# Patient Record
Sex: Female | Born: 1990 | Race: White | Hispanic: No | Marital: Single | Smoking: Never smoker
Health system: Southern US, Community
[De-identification: ages and names within clinical notes are randomized; demographics above are authoritative.]

## PROBLEM LIST (undated history)

## (undated) ENCOUNTER — Inpatient Hospital Stay (HOSPITAL_COMMUNITY): Payer: Self-pay

## (undated) DIAGNOSIS — N926 Irregular menstruation, unspecified: Secondary | ICD-10-CM

## (undated) DIAGNOSIS — B9689 Other specified bacterial agents as the cause of diseases classified elsewhere: Secondary | ICD-10-CM

## (undated) DIAGNOSIS — N898 Other specified noninflammatory disorders of vagina: Secondary | ICD-10-CM

## (undated) DIAGNOSIS — N760 Acute vaginitis: Secondary | ICD-10-CM

## (undated) DIAGNOSIS — R1032 Left lower quadrant pain: Secondary | ICD-10-CM

## (undated) DIAGNOSIS — F419 Anxiety disorder, unspecified: Secondary | ICD-10-CM

## (undated) DIAGNOSIS — R102 Pelvic and perineal pain: Secondary | ICD-10-CM

## (undated) DIAGNOSIS — E7800 Pure hypercholesterolemia, unspecified: Secondary | ICD-10-CM

## (undated) DIAGNOSIS — Z113 Encounter for screening for infections with a predominantly sexual mode of transmission: Secondary | ICD-10-CM

## (undated) DIAGNOSIS — J189 Pneumonia, unspecified organism: Secondary | ICD-10-CM

## (undated) DIAGNOSIS — Z319 Encounter for procreative management, unspecified: Secondary | ICD-10-CM

## (undated) HISTORY — PX: WISDOM TOOTH EXTRACTION: SHX21

## (undated) HISTORY — DX: Encounter for procreative management, unspecified: Z31.9

## (undated) HISTORY — DX: Pelvic and perineal pain: R10.2

## (undated) HISTORY — DX: Left lower quadrant pain: R10.32

## (undated) HISTORY — DX: Pure hypercholesterolemia, unspecified: E78.00

## (undated) HISTORY — DX: Other specified noninflammatory disorders of vagina: N89.8

## (undated) HISTORY — PX: TONSILLECTOMY: SUR1361

## (undated) HISTORY — DX: Other specified bacterial agents as the cause of diseases classified elsewhere: B96.89

## (undated) HISTORY — DX: Acute vaginitis: N76.0

## (undated) HISTORY — DX: Irregular menstruation, unspecified: N92.6

## (undated) HISTORY — DX: Encounter for screening for infections with a predominantly sexual mode of transmission: Z11.3

## (undated) HISTORY — DX: Pneumonia, unspecified organism: J18.9

---
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
## 2011-03-21 ENCOUNTER — Emergency Department (HOSPITAL_COMMUNITY)
Admission: EM | Admit: 2011-03-21 | Discharge: 2011-03-21 | Disposition: A | Discharge status: Home / Self Care | Payer: BC Managed Care – PPO | Attending: Emergency Medicine | Admitting: Emergency Medicine

## 2011-03-21 DIAGNOSIS — N390 Urinary tract infection, site not specified: Secondary | ICD-10-CM | POA: Insufficient documentation

## 2011-03-21 DIAGNOSIS — O2390 Unspecified genitourinary tract infection in pregnancy, unspecified trimester: Secondary | ICD-10-CM | POA: Insufficient documentation

## 2011-03-21 DIAGNOSIS — N898 Other specified noninflammatory disorders of vagina: Secondary | ICD-10-CM | POA: Insufficient documentation

## 2011-03-21 LAB — URINALYSIS, ROUTINE W REFLEX MICROSCOPIC
Ketones, ur: NEGATIVE mg/dL
Protein, ur: NEGATIVE mg/dL
Urobilinogen, UA: 0.2 mg/dL (ref 0.0–1.0)

## 2011-03-21 LAB — HCG, QUANTITATIVE, PREGNANCY: hCG, Beta Chain, Quant, S: 935 m[IU]/mL — ABNORMAL HIGH (ref <5)

## 2011-03-21 LAB — RPR: RPR Ser Ql: NONREACTIVE

## 2011-03-21 LAB — WET PREP, GENITAL
Trich, Wet Prep: NONE SEEN
Yeast Wet Prep HPF POC: NONE SEEN

## 2011-03-21 LAB — URINE MICROSCOPIC-ADD ON

## 2011-03-22 LAB — GC/CHLAMYDIA PROBE AMP, GENITAL: Chlamydia, DNA Probe: NEGATIVE

## 2011-03-23 LAB — HIV ANTIBODY (ROUTINE TESTING W REFLEX): HIV: NONREACTIVE

## 2011-03-23 LAB — URINE CULTURE: Colony Count: 35000

## 2011-03-23 LAB — ABO/RH: RH Type: POSITIVE

## 2011-03-23 LAB — RUBELLA ANTIBODY, IGM: Rubella: IMMUNE

## 2011-08-10 LAB — URINE MICROSCOPIC-ADD ON

## 2011-08-10 LAB — URINALYSIS, ROUTINE W REFLEX MICROSCOPIC
Glucose, UA: NEGATIVE
Ketones, ur: NEGATIVE
Leukocytes, UA: NEGATIVE
Nitrite: NEGATIVE
Protein, ur: NEGATIVE
Urobilinogen, UA: 0.2

## 2011-08-10 LAB — PREGNANCY, URINE: Preg Test, Ur: NEGATIVE

## 2011-11-04 ENCOUNTER — Encounter (HOSPITAL_COMMUNITY): Payer: Self-pay

## 2011-11-04 ENCOUNTER — Inpatient Hospital Stay (HOSPITAL_COMMUNITY)
Admission: AD | Admit: 2011-11-04 | Discharge: 2011-11-04 | Disposition: A | Discharge status: Home / Self Care | Payer: BC Managed Care – PPO | Source: Ambulatory Visit | Attending: Family Medicine | Admitting: Family Medicine

## 2011-11-04 DIAGNOSIS — O99891 Other specified diseases and conditions complicating pregnancy: Secondary | ICD-10-CM | POA: Insufficient documentation

## 2011-11-04 DIAGNOSIS — R109 Unspecified abdominal pain: Secondary | ICD-10-CM | POA: Insufficient documentation

## 2011-11-04 LAB — URINALYSIS, ROUTINE W REFLEX MICROSCOPIC
Bilirubin Urine: NEGATIVE
Leukocytes, UA: NEGATIVE
Nitrite: NEGATIVE
Specific Gravity, Urine: 1.025 (ref 1.005–1.030)
Urobilinogen, UA: 0.2 mg/dL (ref 0.0–1.0)
pH: 6.5 (ref 5.0–8.0)

## 2011-11-04 LAB — WET PREP, GENITAL: Clue Cells Wet Prep HPF POC: NONE SEEN

## 2011-11-04 NOTE — Progress Notes (Signed)
Patient presents with a lot of lower abdominal pressure, onset at work, hurts when moving around and walking.  37 weeks

## 2011-11-04 NOTE — ED Provider Notes (Signed)
History     Chief Complaint  Patient presents with  . Abdominal Pain   HPI Comments: 20yo G1 at [redacted]w[redacted]d who presents with significant pelvic pressure. Pt had an 9hr shift at [employer] today and around hour 8, she became very uncomfortable with pelvic pressure.  She says that she noticed that her legs were really swollen around that time, too.  Denies any ctx.    Denies loss of fluid, vaginal bleeding.     Notes a lot of vaginal discharge with a bad smell, no itching/burning.       Past Medical History  Diagnosis Date  . No pertinent past medical history     Past Surgical History  Procedure Date  . Tonsillectomy   . Wisdom tooth extraction     History reviewed. No pertinent family history.  History  Substance Use Topics  . Smoking status: Never Smoker   . Smokeless tobacco: Not on file  . Alcohol Use: No    Allergies: No Known Allergies  Prescriptions prior to admission  Medication Sig Dispense Refill  . acetaminophen (TYLENOL) 325 MG tablet Take 325 mg by mouth every 6 (six) hours as needed. Head aches       . famotidine (PEPCID) 20 MG tablet Take 20 mg by mouth 2 (two) times daily as needed. heartburn       . hydrOXYzine (VISTARIL) 25 MG capsule Take 25 mg by mouth 3 (three) times daily as needed. anxiety       . Multiple Vitamin (MULITIVITAMIN WITH MINERALS) TABS Take 1 tablet by mouth 2 (two) times daily.          Review of Systems  Constitutional: Negative for fever and chills.  Eyes: Negative for blurred vision.  Respiratory: Negative for cough and wheezing.   Cardiovascular: Negative for chest pain.  Gastrointestinal: Positive for heartburn. Negative for nausea, vomiting and abdominal pain.  Genitourinary: Negative for dysuria.  Skin: Negative for itching and rash.  Neurological: Positive for headaches.  Psychiatric/Behavioral: Negative for depression.   Physical Exam   Blood pressure 126/68, pulse 63, temperature 98.1 F (36.7 C), temperature source  Oral, resp. rate 18, height 5\' 7"  (1.702 m), weight 74.39 kg (164 lb).  Physical Exam  Constitutional: She is oriented to person, place, and time. She appears well-developed and well-nourished.  HENT:  Head: Normocephalic and atraumatic.  Eyes: EOM are normal.  Neck: Normal range of motion.  Cardiovascular: Normal rate and regular rhythm.   Respiratory: Effort normal and breath sounds normal.  GI:       Gravid, age appropriate for dates  Musculoskeletal: Normal range of motion.  Neurological: She is alert and oriented to person, place, and time.  Skin: Skin is warm and dry.  Psychiatric: She has a normal mood and affect.    MAU Course  Procedures  MDM -monitor on toco - wet prep -UA  Assessment and Plan  20yo G1 at [redacted]w[redacted]d who presents with pelvic pressure.  -FHT: baseline 145, accelerations present, decels absent, cat 1 -send UA, wet prep  Discussed with Caren Griffins, CNM  O'Grady, Benney [place] 11/04/2011, 6:18 PM

## 2011-11-04 NOTE — Progress Notes (Signed)
Pt presents to MAU with chief complaint of lower abdominal pressure. Pt is [redacted]w[redacted]d, G1. Works at [employer]; 9 hr days, on feet a lot. Pt has noticed increased swelling, headache; denies blurred vision. No problems with this pregnancy.

## 2011-11-04 NOTE — ED Notes (Signed)
Wet prep collected by RN- no speculum, no pelvic done. Sent to lab as ordered by Dr. Maren Reamer.

## 2011-11-20 NOTE — L&D Delivery Note (Signed)
Operative Delivery Note At 6:06 PM a viable female was delivered via Vacuum Assisted Vaginal Delivery due to fetal bradycardia.  The patient was examined and found to be Presentation: vertex; Position: Occiput,, Anterior; Station: +3.  Verbal consent: obtained from patient.  Risks and benefits discussed in detail.  Risks include, but are not limited to the risks of anesthesia, bleeding, infection, damage to maternal tissues, fetal cephalhematoma.  There is also the risk of inability to effect vaginal delivery of the head, or shoulder dystocia that cannot be resolved by established maneuvers, leading to the need for emergency cesarean section.  The Kiwi Omnicup was positioned over the sagittal suture 3 cm anterior to posterior fontanelle.  Pressure was then increased to 500 mmHg, and the patient was instructed to push.  Pulling was administered along the pelvic curve.  1 pull was administered during 1 contractions, with release of pressure between contractions.  No popoffs.  The infant was then delivered atraumatically.  APGAR: 8, 9; weight 7 lb 12.7 oz (3535 g).   Placenta status: Intact, Spontaneous.   Cord: 3 vessels with the following complications: None.    Sponge, instrument and needle counts were correct x2.  Anesthesia: Epidural  Instruments: correct x 2 Episiotomy: None Lacerations: none Est. Blood Loss (mL): 300 mL  Mom to postpartum.  Baby to nursery-stable.  STINSON, JACOB JEHIEL 09/19/2011, 10:09 PM

## 2011-11-25 ENCOUNTER — Encounter (HOSPITAL_COMMUNITY): Payer: Self-pay | Admitting: *Deleted

## 2011-11-25 ENCOUNTER — Inpatient Hospital Stay (HOSPITAL_COMMUNITY)
Admission: AD | Admit: 2011-11-25 | Discharge: 2011-11-25 | Disposition: A | Discharge status: Home / Self Care | Payer: BC Managed Care – PPO | Source: Ambulatory Visit | Attending: Obstetrics & Gynecology | Admitting: Obstetrics & Gynecology

## 2011-11-25 ENCOUNTER — Inpatient Hospital Stay (HOSPITAL_COMMUNITY)
Admission: AD | Admit: 2011-11-25 | Discharge: 2011-11-25 | Disposition: A | Discharge status: Home / Self Care | Payer: BC Managed Care – PPO | Source: Ambulatory Visit | Attending: Family Medicine | Admitting: Family Medicine

## 2011-11-25 DIAGNOSIS — O479 False labor, unspecified: Secondary | ICD-10-CM | POA: Insufficient documentation

## 2011-11-25 DIAGNOSIS — R12 Heartburn: Secondary | ICD-10-CM | POA: Insufficient documentation

## 2011-11-25 DIAGNOSIS — O4700 False labor before 37 completed weeks of gestation, unspecified trimester: Secondary | ICD-10-CM

## 2011-11-25 HISTORY — DX: Anxiety disorder, unspecified: F41.9

## 2011-11-25 LAB — COMPREHENSIVE METABOLIC PANEL
ALT: 9 U/L (ref 0–35)
AST: 15 U/L (ref 0–37)
Alkaline Phosphatase: 137 U/L — ABNORMAL HIGH (ref 39–117)
CO2: 22 mEq/L (ref 19–32)
Calcium: 8.9 mg/dL (ref 8.4–10.5)
GFR calc non Af Amer: >90 mL/min (ref >90)
Potassium: 3.8 mEq/L (ref 3.5–5.1)
Sodium: 136 mEq/L (ref 135–145)

## 2011-11-25 LAB — URINE MICROSCOPIC-ADD ON

## 2011-11-25 LAB — CBC
Hemoglobin: 12.9 g/dL (ref 12.0–15.0)
MCH: 31.9 pg (ref 26.0–34.0)
Platelets: 259 10*3/uL (ref 150–400)
RBC: 4.04 MIL/uL (ref 3.87–5.11)
WBC: 12.9 10*3/uL — ABNORMAL HIGH (ref 4.0–10.5)

## 2011-11-25 LAB — URINALYSIS, ROUTINE W REFLEX MICROSCOPIC
Bilirubin Urine: NEGATIVE
Ketones, ur: NEGATIVE mg/dL
Specific Gravity, Urine: 1.025 (ref 1.005–1.030)
Urobilinogen, UA: 0.2 mg/dL (ref 0.0–1.0)
pH: 6 (ref 5.0–8.0)

## 2011-11-25 MED ORDER — NALBUPHINE SYRINGE 5 MG/0.5 ML
10.0000 mg | INJECTION | Freq: Once | INTRAMUSCULAR | Status: AC
  Administered 2011-11-25: 10 mg via INTRAMUSCULAR
  Filled 2011-11-25: qty 1

## 2011-11-25 MED ORDER — CALCIUM CARBONATE ANTACID 500 MG PO CHEW
400.0000 mg | CHEWABLE_TABLET | Freq: Once | ORAL | Status: AC
  Administered 2011-11-25: 400 mg via ORAL
  Filled 2011-11-25: qty 2

## 2011-11-25 NOTE — Progress Notes (Signed)
Pincus Badder CNM notified of pt's admission and status. Aware of ctx pattern, sve, elevated B/Ps, c/o heartburn which has had "the entire preg".

## 2011-11-25 NOTE — Progress Notes (Signed)
Philipp Deputy CNM notified of pt's lab work, B/Ps, some variables on efm which resolved when turned to L side. EFM strip reviewed. Pt stable for d/c home.

## 2011-11-25 NOTE — ED Provider Notes (Signed)
History   Tiffany Goodwin is a 21 y.o. G1P0000 at [redacted]w[redacted]d presenting w/ reports of UCs since yesterday, w/ increased frequency and intensity since this afternoon.  Reports some bloody show.  Reports +fm.  Denies LOF.  Most of pain in lower back, but some in abdomen- associated w/ UCs.  Has tried heating pads and warm baths w/ minimal relief.  Was seen in MAU w/ the same complaints this am- labwork was done to r/o pre-eclampsia d/t mildly elevated bp's 140's/80's, and was wnl.  Pt made no cervical change and was d/c'd home.  Next appointment at [facility] on Wed 1/9.    Chief Complaint  Patient presents with   Contractions   HPI  OB History    Grav Para Term Preterm Abortions TAB SAB Ect Mult Living   1 0 0 0 0 0 0 0 0 0       Past Medical History  Diagnosis Date   Anxiety     Past Surgical History  Procedure Date   Tonsillectomy    Wisdom tooth extraction    Wisdom tooth extraction     Family History  Problem Relation Age of Onset   Anesthesia problems Neg Hx    Hypotension Neg Hx    Malignant hyperthermia Neg Hx    Pseudochol deficiency Neg Hx     History  Substance Use Topics   Smoking status: Never Smoker    Smokeless tobacco: Not on file   Alcohol Use: No    Allergies: No Known Allergies  Prescriptions prior to admission  Medication Sig Dispense Refill   acetaminophen (TYLENOL) 325 MG tablet Take 325 mg by mouth every 6 (six) hours as needed. Head aches        famotidine (PEPCID) 20 MG tablet Take 20 mg by mouth 2 (two) times daily as needed. heartburn        hydrOXYzine (VISTARIL) 25 MG capsule Take 25 mg by mouth 3 (three) times daily as needed. anxiety        Multiple Vitamin (MULITIVITAMIN WITH MINERALS) TABS Take 1 tablet by mouth 2 (two) times daily.          Review of Systems  Constitutional: Negative.   HENT: Negative.   Eyes: Negative.  Negative for blurred vision and double vision.  Respiratory: Negative.   Cardiovascular: Negative.     Gastrointestinal: Positive for abdominal pain (UC's).       Negative RUQ pain  Genitourinary: Negative.   Musculoskeletal: Positive for back pain (Low back pain associated w/ UC's).  Skin: Negative.   Neurological: Negative.  Negative for dizziness and headaches.  Endo/Heme/Allergies: Negative.   Psychiatric/Behavioral: Negative.    Physical Exam   Blood pressure 145/83, pulse 83, temperature 98.4 F (36.9 C), temperature source Oral, resp. rate 20, height 5\' 6"  (1.676 m), weight 75.297 kg (166 lb), SpO2 94.00%.  Physical Exam  Constitutional: She is oriented to person, place, and time. She appears well-developed and well-nourished.  HENT:  Head: Normocephalic.  Eyes: Pupils are equal, round, and reactive to light.  Neck: Normal range of motion.  Cardiovascular: Normal rate and regular rhythm.   Respiratory: Effort normal and breath sounds normal.  GI: Soft.       Gravid  Genitourinary: Uterus normal.  Musculoskeletal: Normal range of motion. She exhibits edema (tr/1+ bilateral lower extremity edema).  Neurological: She is alert and oriented to person, place, and time. She has normal reflexes.  Skin: Skin is warm and dry.  Psychiatric: She has a  normal mood and affect. Judgment and thought content normal.       Appears uncomfortable, teary-eyed   SVE: 1.5/100/-1 vtx w/ some bloody show by MAU RN.  Same as exam from this am's visit- except for more effaced FHR: 120 w/ mild then moderate variability, 15x15 accels, no decels= Cat I UCs: q 2-4, mild to palpation  MAU Course  Procedures  EFM SVE Nubain 10mg  IM x 1  Assessment and Plan   A: IUP @ 39.6wks     Early latent phase labor     Cat I FHR     GBS pos  P: Notified pt she could get up/walk/move around/change positions     RN repeat SVE 1 hour from 1st exam and call w/ results  Addendum: Repeat SVE by MAU RN 2/100/-1, minimal change.  UC's have become less frequent and intense.  FHR: Cat I.  POC reviewed w/  Zerita Boers, CNM.  Will administer Nubain IM and d/c home.  Pt to keep appt at [facility] on Wed as scheduled.  Reviewed reasons to return to MAU sooner: UC's increasing in frequency/intensity, SROM, VB, or decreased FM.  Pt verbalized understanding of all.  Friend to drive pt home.          Joellyn Haff, SNM 11/25/2011, 10:15 PM

## 2011-11-25 NOTE — Progress Notes (Signed)
Written and verbal d/c instructions given and understanding voiced. Discussed warm showers/baths, position changes, eating small meals and staying hydrated. Keep sch appt and call for concerns

## 2011-11-25 NOTE — Progress Notes (Signed)
Has much lower back pain with ctxs

## 2011-11-25 NOTE — ED Notes (Signed)
Heartburn is better now.

## 2011-11-26 ENCOUNTER — Encounter (HOSPITAL_COMMUNITY): Payer: Self-pay | Admitting: Anesthesiology

## 2011-11-26 ENCOUNTER — Encounter (HOSPITAL_COMMUNITY): Payer: Self-pay | Admitting: *Deleted

## 2011-11-26 ENCOUNTER — Inpatient Hospital Stay (HOSPITAL_COMMUNITY)
Admission: AD | Admit: 2011-11-26 | Discharge: 2011-11-28 | DRG: 775 | Disposition: A | Discharge status: Home / Self Care | Payer: Medicaid Other | Source: Ambulatory Visit | Attending: Obstetrics & Gynecology | Admitting: Obstetrics & Gynecology

## 2011-11-26 ENCOUNTER — Inpatient Hospital Stay (HOSPITAL_COMMUNITY): Payer: Medicaid Other | Admitting: Anesthesiology

## 2011-11-26 DIAGNOSIS — Z22330 Carrier of Group B streptococcus: Secondary | ICD-10-CM

## 2011-11-26 DIAGNOSIS — O99892 Other specified diseases and conditions complicating childbirth: Secondary | ICD-10-CM | POA: Diagnosis present

## 2011-11-26 DIAGNOSIS — O9989 Other specified diseases and conditions complicating pregnancy, childbirth and the puerperium: Secondary | ICD-10-CM

## 2011-11-26 LAB — CBC
HCT: 40.8 % (ref 36.0–46.0)
MCHC: 34.3 g/dL (ref 30.0–36.0)
Platelets: 259 10*3/uL (ref 150–400)
RDW: 13.3 % (ref 11.5–15.5)

## 2011-11-26 LAB — RPR: RPR Ser Ql: NONREACTIVE

## 2011-11-26 LAB — HEPATITIS B SURFACE ANTIGEN: Hepatitis B Surface Ag: NEGATIVE

## 2011-11-26 MED ORDER — BENZOCAINE-MENTHOL 20-0.5 % EX AERO: 1.0000 "application " | INHALATION_SPRAY | CUTANEOUS | Status: DC | PRN

## 2011-11-26 MED ORDER — ACETAMINOPHEN 325 MG PO TABS: 650.0000 mg | ORAL_TABLET | ORAL | Status: DC | PRN

## 2011-11-26 MED ORDER — FLEET ENEMA 7-19 GM/118ML RE ENEM: 1.0000 | ENEMA | RECTAL | Status: DC | PRN

## 2011-11-26 MED ORDER — DIPHENHYDRAMINE HCL 50 MG/ML IJ SOLN: 12.5000 mg | INTRAMUSCULAR | Status: DC | PRN

## 2011-11-26 MED ORDER — PHENYLEPHRINE 40 MCG/ML (10ML) SYRINGE FOR IV PUSH (FOR BLOOD PRESSURE SUPPORT)
80.0000 ug | PREFILLED_SYRINGE | INTRAVENOUS | Status: DC | PRN
  Filled 2011-11-26: qty 5

## 2011-11-26 MED ORDER — WITCH HAZEL-GLYCERIN EX PADS
1.0000 | MEDICATED_PAD | CUTANEOUS | Status: DC | PRN
Start: 2011-11-26 — End: 2011-11-28

## 2011-11-26 MED ORDER — TETANUS-DIPHTH-ACELL PERTUSSIS 5-2.5-18.5 LF-MCG/0.5 IM SUSP
0.5000 mL | Freq: Once | INTRAMUSCULAR | Status: AC
  Administered 2011-11-27: 0.5 mL via INTRAMUSCULAR
  Filled 2011-11-26: qty 0.5

## 2011-11-26 MED ORDER — OXYTOCIN 20 UNITS IN LACTATED RINGERS INFUSION - SIMPLE: 125.0000 mL/h | Freq: Once | INTRAVENOUS | Status: DC

## 2011-11-26 MED ORDER — DEXTROSE 5 % IV SOLN
5.0000 10*6.[IU] | Freq: Once | INTRAVENOUS | Status: DC
  Administered 2011-11-26: 5 10*6.[IU] via INTRAVENOUS
  Filled 2011-11-26: qty 5

## 2011-11-26 MED ORDER — OXYTOCIN 20 UNITS IN LACTATED RINGERS INFUSION - SIMPLE
1.0000 m[IU]/min | INTRAVENOUS | Status: DC
  Administered 2011-11-26: 2 m[IU]/min via INTRAVENOUS
  Administered 2011-11-26: 333 m[IU]/min via INTRAVENOUS
  Filled 2011-11-26: qty 1000

## 2011-11-26 MED ORDER — ZOLPIDEM TARTRATE 5 MG PO TABS: 5.0000 mg | ORAL_TABLET | Freq: Every evening | ORAL | Status: DC | PRN

## 2011-11-26 MED ORDER — ONDANSETRON HCL 4 MG PO TABS: 4.0000 mg | ORAL_TABLET | ORAL | Status: DC | PRN

## 2011-11-26 MED ORDER — DIPHENHYDRAMINE HCL 25 MG PO CAPS: 25.0000 mg | ORAL_CAPSULE | Freq: Four times a day (QID) | ORAL | Status: DC | PRN

## 2011-11-26 MED ORDER — LIDOCAINE HCL 1.5 % IJ SOLN
INTRAMUSCULAR | Status: DC | PRN
  Administered 2011-11-26 (×2): 5 mL via EPIDURAL

## 2011-11-26 MED ORDER — PENICILLIN G POTASSIUM 5000000 UNITS IJ SOLR
2.5000 10*6.[IU] | INTRAVENOUS | Status: DC
  Administered 2011-11-26: 2.5 10*6.[IU] via INTRAVENOUS
  Filled 2011-11-26 (×4): qty 2.5

## 2011-11-26 MED ORDER — LANOLIN HYDROUS EX OINT: TOPICAL_OINTMENT | CUTANEOUS | Status: DC | PRN

## 2011-11-26 MED ORDER — LACTATED RINGERS IV SOLN: 500.0000 mL | Freq: Once | INTRAVENOUS | Status: DC

## 2011-11-26 MED ORDER — PRENATAL MULTIVITAMIN CH
1.0000 | ORAL_TABLET | Freq: Every day | ORAL | Status: DC
  Administered 2011-11-27 – 2011-11-28 (×2): 1 via ORAL
  Filled 2011-11-26 (×2): qty 1

## 2011-11-26 MED ORDER — BENZOCAINE-MENTHOL 20-0.5 % EX AERO
INHALATION_SPRAY | CUTANEOUS | Status: AC
  Administered 2011-11-26: 22:00:00
  Filled 2011-11-26: qty 56

## 2011-11-26 MED ORDER — IBUPROFEN 600 MG PO TABS
600.0000 mg | ORAL_TABLET | Freq: Four times a day (QID) | ORAL | Status: DC
  Administered 2011-11-26 – 2011-11-28 (×6): 600 mg via ORAL
  Filled 2011-11-26 (×6): qty 1

## 2011-11-26 MED ORDER — EPHEDRINE 5 MG/ML INJ
10.0000 mg | INTRAVENOUS | Status: DC | PRN
  Administered 2011-11-26: 15 mg via INTRAVENOUS
  Filled 2011-11-26: qty 4

## 2011-11-26 MED ORDER — PHENYLEPHRINE 40 MCG/ML (10ML) SYRINGE FOR IV PUSH (FOR BLOOD PRESSURE SUPPORT): 80.0000 ug | PREFILLED_SYRINGE | INTRAVENOUS | Status: DC | PRN

## 2011-11-26 MED ORDER — TERBUTALINE SULFATE 1 MG/ML IJ SOLN: 0.2500 mg | Freq: Once | INTRAMUSCULAR | Status: DC | PRN

## 2011-11-26 MED ORDER — FENTANYL 2.5 MCG/ML BUPIVACAINE 1/10 % EPIDURAL INFUSION (WH - ANES)
14.0000 mL/h | INTRAMUSCULAR | Status: DC
  Administered 2011-11-26: 14 mL/h via EPIDURAL
  Filled 2011-11-26 (×2): qty 60

## 2011-11-26 MED ORDER — LIDOCAINE HCL (PF) 1 % IJ SOLN
30.0000 mL | INTRAMUSCULAR | Status: DC | PRN
  Filled 2011-11-26: qty 30

## 2011-11-26 MED ORDER — SENNOSIDES-DOCUSATE SODIUM 8.6-50 MG PO TABS
2.0000 | ORAL_TABLET | Freq: Every day | ORAL | Status: DC
  Administered 2011-11-26 – 2011-11-27 (×2): 2 via ORAL

## 2011-11-26 MED ORDER — SIMETHICONE 80 MG PO CHEW: 80.0000 mg | CHEWABLE_TABLET | ORAL | Status: DC | PRN

## 2011-11-26 MED ORDER — ONDANSETRON HCL 4 MG/2ML IJ SOLN: 4.0000 mg | Freq: Four times a day (QID) | INTRAMUSCULAR | Status: DC | PRN

## 2011-11-26 MED ORDER — LACTATED RINGERS IV SOLN: 500.0000 mL | INTRAVENOUS | Status: DC | PRN

## 2011-11-26 MED ORDER — CITRIC ACID-SODIUM CITRATE 334-500 MG/5ML PO SOLN: 30.0000 mL | ORAL | Status: DC | PRN

## 2011-11-26 MED ORDER — LACTATED RINGERS IV SOLN
INTRAVENOUS | Status: DC
  Administered 2011-11-26 (×2): via INTRAVENOUS

## 2011-11-26 MED ORDER — OXYCODONE-ACETAMINOPHEN 5-325 MG PO TABS: 2.0000 | ORAL_TABLET | ORAL | Status: DC | PRN

## 2011-11-26 MED ORDER — OXYCODONE-ACETAMINOPHEN 5-325 MG PO TABS
1.0000 | ORAL_TABLET | ORAL | Status: DC | PRN
  Administered 2011-11-27: 1 via ORAL
  Filled 2011-11-26: qty 1

## 2011-11-26 MED ORDER — NALBUPHINE SYRINGE 5 MG/0.5 ML
10.0000 mg | INJECTION | INTRAMUSCULAR | Status: DC | PRN
  Administered 2011-11-26: 10 mg via INTRAVENOUS

## 2011-11-26 MED ORDER — DIBUCAINE 1 % RE OINT: 1.0000 "application " | TOPICAL_OINTMENT | RECTAL | Status: DC | PRN

## 2011-11-26 MED ORDER — EPHEDRINE 5 MG/ML INJ: 10.0000 mg | INTRAVENOUS | Status: DC | PRN

## 2011-11-26 MED ORDER — ONDANSETRON HCL 4 MG/2ML IJ SOLN: 4.0000 mg | INTRAMUSCULAR | Status: DC | PRN

## 2011-11-26 MED ORDER — FENTANYL 2.5 MCG/ML BUPIVACAINE 1/10 % EPIDURAL INFUSION (WH - ANES)
INTRAMUSCULAR | Status: DC | PRN
  Administered 2011-11-26: 12 mL/h via EPIDURAL

## 2011-11-26 MED ORDER — NALBUPHINE SYRINGE 5 MG/0.5 ML
INJECTION | INTRAMUSCULAR | Status: AC
  Filled 2011-11-26: qty 1

## 2011-11-26 MED ORDER — OXYTOCIN BOLUS FROM INFUSION
500.0000 mL | Freq: Once | INTRAVENOUS | Status: DC
  Filled 2011-11-26: qty 500

## 2011-11-26 MED ORDER — IBUPROFEN 600 MG PO TABS: 600.0000 mg | ORAL_TABLET | Freq: Four times a day (QID) | ORAL | Status: DC | PRN

## 2011-11-26 NOTE — Progress Notes (Signed)
Subjective: Patient fairly comfortable - feels some pressure in bottom.  Objective: BP 137/82  Pulse 102  Temp(Src) 97.7 F (36.5 C) (Oral)  Resp 18  Ht 5\' 6"  (1.676 m)  Wt 75.297 kg (166 lb)  BMI 26.79 kg/m2      FHT:  FHR: 130 bpm, variability: moderate,  accelerations:  Present,  decelerations:  Absent UC:   regular, every 2-3 minutes SVE:   Dilation: 8 Effacement (%): 100 Station: +1 Exam by:: Currie Paris, RN  Labs: Lab Results  Component Value Date   WBC 17.6* 11/26/2011   HGB 14.0 11/26/2011   HCT 40.8 11/26/2011   MCV 92.7 11/26/2011   PLT 259 11/26/2011    Assessment / Plan: Augmentation of labor, progressing well  Labor: Progressing normally Fetal Wellbeing:  Category I Pain Control:  Epidural I/D:  penicillin Anticipated MOD:  NSVD  STINSON, JACOB JEHIEL 11/26/2011, 4:09 PM

## 2011-11-26 NOTE — Progress Notes (Signed)
Pt in for labor eval via ems, reports ucs since Saturday, worse in back today.  Reports blood-tinged mucus discharge.  + FM.

## 2011-11-26 NOTE — ED Notes (Signed)
Cool cloth to neck, rm temp turned down.  Mom instructed on counter pressure to low back

## 2011-11-26 NOTE — Progress Notes (Signed)
Subjective: Patient more comfortable with contractions.  Objective: BP 106/61  Pulse 96  Temp(Src) 97.9 F (36.6 C) (Oral)  Resp 18  Ht 5\' 6"  (1.676 m)  Wt 75.297 kg (166 lb)  BMI 26.79 kg/m2     FHT:  FHR: 130s bpm, variability: moderate,  accelerations:  Present,  decelerations:  Absent UC:   regular, every 5-8 minutes SVE:   Dilation: 5.5 Effacement (%): 90 Station: -1 Exam by:: [name]: Lab Results  Component Value Date   WBC 17.6* 11/26/2011   HGB 14.0 11/26/2011   HCT 40.8 11/26/2011   MCV 92.7 11/26/2011   PLT 259 11/26/2011    Assessment / Plan: AROM with clear fluid.  Will start pitocin.  expect NSVD.  Category 1 tracing.  Penicillin for GBS.   Tiffany Goodwin, Tiffany Goodwin 11/26/2011, 2:11 PM

## 2011-11-26 NOTE — H&P (Signed)
Tiffany Goodwin is a 21 y.o. female presenting for abdominal and back pain since Saturday. She does not report having vaginal discharge or bleeding. No nausea or vomiting, afebrile. Came yesterday to MAU and had cervix 2 cm. Comes today and it is decided to admit her after having cervical changes. History OB History    Grav Para Term Preterm Abortions TAB SAB Ect Mult Living   1 0 0 0 0 0 0 0 0 0      Past Medical History  Diagnosis Date  . Anxiety    Past Surgical History  Procedure Date  . Tonsillectomy   . Wisdom tooth extraction   . Wisdom tooth extraction    Family History: family history is negative for Anesthesia problems, and Hypotension, and Malignant hyperthermia, and Pseudochol deficiency, . Social History:  reports that she has never smoked. She does not have any smokeless tobacco history on file. She reports that she does not drink alcohol or use illicit drugs.  Review of Systems  Constitutional: Negative for fever and chills.  Eyes: Negative for blurred vision and double vision.  Respiratory: Negative.   Cardiovascular: Negative for chest pain.  Gastrointestinal: Positive for abdominal pain.  Genitourinary: Negative.   Musculoskeletal: Positive for back pain.  Neurological: Negative.  Negative for headaches.    Dilation: 5 Effacement (%): 90 Station: -1 Exam by:: Dr. Adrian Blackwater Blood pressure 138/92 and after pain medication pt is 109/79 and  pulse from 107 to 79 , temperature 97.7 F (36.5 C), temperature source Oral, resp. rate 20. Maternal Exam:  Introitus: Vagina is positive for vaginal discharge.    Physical Exam  Constitutional: She is oriented to person, place, and time. She appears well-developed. She appears distressed.       Due to pain.  HENT:  Mouth/Throat: No oropharyngeal exudate.  Eyes: Conjunctivae are normal.  Neck: Neck supple.  Cardiovascular: Normal rate, regular rhythm and normal heart sounds.   No murmur heard. Respiratory: Effort normal  and breath sounds normal. No respiratory distress. She has no wheezes.  GI: Bowel sounds are normal. There is no rebound and no guarding.       Normal abdominal exam of a 40 w pregnant pt.  Genitourinary: Vaginal discharge found.       Normal vagina. Cervix 5-6 cm. 90 % effaced. Station -1 Mucus plug expulsed. Intact membranes.  Musculoskeletal: She exhibits no edema.  Neurological: She is alert and oriented to person, place, and time. She has normal reflexes.  Skin: No rash noted.     Prenatal labs: ABO, Rh: A/Positive/-- (05/04 0000) Antibody: Negative (05/04 0000) Rubella: Immune (05/04 0000) RPR: NON REACTIVE (05/02 1047)  HBsAg:   not found HIV: Non-reactive (05/04 0000)  GBS: Positive (12/20 0000)   Assessment/Plan: Assessment: 1. Labor: active phase at 40 weeks 2. Fetal Wellbeing: Category 1  3. Pain Control: may have epidural 4. GBS: Positive 5. 40 week IUP  Plan:  1. Admit to BS. 2. Routine L&D orders 3. Penicillin per GBS protocol. 3. Analgesia/anesthesia PRN   D. Piloto [employer]. MD PGY-1   11/26/2011, 11:42 AM

## 2011-11-26 NOTE — Anesthesia Procedure Notes (Signed)
Epidural Patient location during procedure: OB Start time: 11/26/2011 1:34 PM End time: 11/26/2011 1:41 PM  Staffing Anesthesiologist: Sandrea Hughs Performed by: anesthesiologist   Preanesthetic Checklist Completed: patient identified, site marked, surgical consent, pre-op evaluation, timeout performed, IV checked, risks and benefits discussed and monitors and equipment checked  Epidural Patient position: sitting Prep: site prepped and draped and DuraPrep Patient monitoring: continuous pulse ox and blood pressure Approach: midline Injection technique: LOR air  Needle:  Needle type: Tuohy  Needle gauge: 17 G Needle length: 9 cm Needle insertion depth: 7 cm Catheter type: closed end flexible Catheter size: 19 Gauge Catheter at skin depth: 12 cm Test dose: negative and 1.5% lidocaine  Assessment Sensory level: T8 Events: blood not aspirated, injection not painful, no injection resistance, negative IV test and no paresthesia

## 2011-11-26 NOTE — Progress Notes (Signed)
Came in by EMS, ongoing labor was here Sat and Sun

## 2011-11-26 NOTE — H&P (Signed)
I have seen and examined this patient in conjunction with Dr Aviva Signs de Criselda Peaches, PGY1.  I have taken this history and performed the exam.  I agree with the note as written above and have made corrections as needed.   Tiffany Goodwin 11/26/2011 2:53 PM

## 2011-11-26 NOTE — Anesthesia Preprocedure Evaluation (Signed)
Anesthesia Evaluation  Patient identified by MRN, date of birth, ID band Patient awake    Reviewed: Allergy & Precautions, H&P , NPO status , Patient's Chart, lab work & pertinent test results  Airway Mallampati: I TM Distance: >3 FB Neck ROM: full    Dental No notable dental hx.    Pulmonary neg pulmonary ROS,    Pulmonary exam normal       Cardiovascular neg cardio ROS     Neuro/Psych PSYCHIATRIC DISORDERS Anxiety Negative Neurological ROS     GI/Hepatic negative GI ROS, Neg liver ROS,   Endo/Other  Negative Endocrine ROS  Renal/GU negative Renal ROS  Genitourinary negative   Musculoskeletal negative musculoskeletal ROS (+)   Abdominal Normal abdominal exam  (+)   Peds negative pediatric ROS (+)  Hematology negative hematology ROS (+)   Anesthesia Other Findings   Reproductive/Obstetrics (+) Pregnancy                           Anesthesia Physical Anesthesia Plan  ASA: II  Anesthesia Plan: Epidural   Post-op Pain Management:    Induction:   Airway Management Planned:   Additional Equipment:   Intra-op Plan:   Post-operative Plan:   Informed Consent: I have reviewed the patients History and Physical, chart, labs and discussed the procedure including the risks, benefits and alternatives for the proposed anesthesia with the patient or authorized representative who has indicated his/her understanding and acceptance.     Plan Discussed with:   Anesthesia Plan Comments:         Anesthesia Quick Evaluation

## 2011-11-27 ENCOUNTER — Encounter (HOSPITAL_COMMUNITY): Payer: Self-pay | Admitting: *Deleted

## 2011-11-27 NOTE — Progress Notes (Signed)
Post Partum Day 1 Subjective: voiding, tolerating PO, + flatus and back pain  Objective: Blood pressure 96/59, pulse 70, temperature 97.6 F (36.4 C), temperature source Oral, resp. rate 18, height 5\' 6"  (1.676 m), weight 75.297 kg (166 lb).  Physical Exam:  General: alert, cooperative, appears stated age and no distress Lochia: appropriate Uterine Fundus: firm Pelvis: no signs of active bleeding, normal lochia  DVT Evaluation: No evidence of DVT seen on physical exam. Negative Homan's sign. No cords or calf tenderness. No significant calf/ankle edema.   Basename 11/26/11 1145 11/25/11 0420  HGB 14.0 12.9  HCT 40.8 37.2    Assessment/Plan: Plan for discharge tomorrow GBS positive, patient treated with ABx intrapartum (PCN) Bottle feeding Cont routine care    LOS: 1 day   Tiffany Goodwin 11/27/2011, 7:57 AM

## 2011-11-27 NOTE — Addendum Note (Signed)
Addendum  created 11/27/11 1037 by Madison Hickman   Modules edited:Charges VN, Notes Section

## 2011-11-27 NOTE — Anesthesia Postprocedure Evaluation (Signed)
Anesthesia Post Note  Patient: Tiffany Goodwin  Procedure(s) Performed: * No procedures listed *  Anesthesia type: Epidural  Patient location: Mother/Baby  Post pain: Pain level controlled  Post assessment: Post-op Vital signs reviewed  Last Vitals:  Filed Vitals:   11/27/11 0608  BP: 96/59  Pulse: 70  Temp: 36.4 C  Resp: 18    Post vital signs: Reviewed  Level of consciousness: awake  Complications: No apparent anesthesia complications

## 2011-11-27 NOTE — Anesthesia Postprocedure Evaluation (Signed)
  Anesthesia Post-op Note  Patient: Financial planner  Procedure(s) Performed: * No procedures listed *  Patient Location: Mother/Baby  Anesthesia Type: Epidural  Level of Consciousness: awake, alert  and oriented  Airway and Oxygen Therapy: Patient Spontanous Breathing  Post-op Pain: none  Post-op Assessment: Post-op Vital signs reviewed, Patient's Cardiovascular Status Stable, No headache, No backache, No residual numbness and No residual motor weakness  Post-op Vital Signs: Reviewed and stable  Complications: No apparent anesthesia complications

## 2011-11-28 MED ORDER — OXYCODONE-ACETAMINOPHEN 5-325 MG PO TABS: 1.0000 | ORAL_TABLET | ORAL | Status: AC | PRN

## 2011-11-28 NOTE — Progress Notes (Signed)
Post Partum Day #2  Subjective: no complaints, up ad lib, voiding, + flatus and denies any pain or abnormal bleeding  Objective: Blood pressure 121/79, pulse 72, temperature 97.9 F (36.6 C), temperature source Oral, resp. rate 18, height 5\' 6"  (1.676 m), weight 75.297 kg (166 lb), SpO2 98.00%, unknown if currently breastfeeding.  Physical Exam:  General: alert, cooperative, appears stated age and no distress Lochia: appropriate Uterine Fundus: firm DVT Evaluation: No evidence of DVT seen on physical exam. Negative Homan's sign. No cords or calf tenderness. No significant calf/ankle edema. Lungs CTA bilaterally, normal resp efffort    Basename 11/26/11 1145  HGB 14.0  HCT 40.8    Assessment/Plan: Discharge home   LOS: 2 days   Cameron Proud 11/28/2011, 11:07 AM

## 2011-11-28 NOTE — Discharge Summary (Signed)
Obstetric Discharge Summary Reason for Admission: onset of labor Prenatal Procedures: none Intrapartum Procedures: spontaneous vaginal delivery, vacuum and GBS prophylaxis Postpartum Procedures: antibiotics Complications-Operative and Postpartum: none Hemoglobin  Date Value Range Status  11/26/2011 14.0  12.0-15.0 (g/dL) Final     HCT  Date Value Range Status  11/26/2011 40.8  36.0-46.0 (%) Final    Discharge Diagnoses: Term Pregnancy-delivered  Discharge Information: Date: 11/28/2011 Activity: unrestricted and pelvic rest Diet: routine Medications: Percocet Condition: stable Instructions: refer to practice specific booklet Discharge to: home Follow-up Information    Follow up with [facility] in 4 weeks. (Keep your scheduled appointment)    Contact information:   [address] [place] [address]          Newborn Data: Live born female  Birth Weight: 6 lb 13.8 oz (3113 g) APGAR: 8, 9  Home with mother.  Tiffany Goodwin 11/28/2011, 11:17 AM

## 2012-01-11 ENCOUNTER — Other Ambulatory Visit: Payer: Self-pay

## 2012-01-11 ENCOUNTER — Encounter (HOSPITAL_COMMUNITY): Payer: Self-pay | Admitting: *Deleted

## 2012-01-11 ENCOUNTER — Emergency Department (HOSPITAL_COMMUNITY): Payer: Medicaid Other

## 2012-01-11 ENCOUNTER — Emergency Department (HOSPITAL_COMMUNITY)
Admission: EM | Admit: 2012-01-11 | Discharge: 2012-01-11 | Disposition: A | Discharge status: Home / Self Care | Payer: Medicaid Other | Attending: Emergency Medicine | Admitting: Emergency Medicine

## 2012-01-11 DIAGNOSIS — R110 Nausea: Secondary | ICD-10-CM | POA: Insufficient documentation

## 2012-01-11 DIAGNOSIS — R079 Chest pain, unspecified: Secondary | ICD-10-CM | POA: Insufficient documentation

## 2012-01-11 DIAGNOSIS — D72829 Elevated white blood cell count, unspecified: Secondary | ICD-10-CM | POA: Insufficient documentation

## 2012-01-11 DIAGNOSIS — IMO0001 Reserved for inherently not codable concepts without codable children: Secondary | ICD-10-CM | POA: Insufficient documentation

## 2012-01-11 DIAGNOSIS — B9789 Other viral agents as the cause of diseases classified elsewhere: Secondary | ICD-10-CM | POA: Insufficient documentation

## 2012-01-11 DIAGNOSIS — B349 Viral infection, unspecified: Secondary | ICD-10-CM

## 2012-01-11 DIAGNOSIS — I498 Other specified cardiac arrhythmias: Secondary | ICD-10-CM | POA: Insufficient documentation

## 2012-01-11 DIAGNOSIS — R51 Headache: Secondary | ICD-10-CM | POA: Insufficient documentation

## 2012-01-11 DIAGNOSIS — R6883 Chills (without fever): Secondary | ICD-10-CM | POA: Insufficient documentation

## 2012-01-11 LAB — BASIC METABOLIC PANEL
Calcium: 9.7 mg/dL (ref 8.4–10.5)
GFR calc Af Amer: >90 mL/min (ref >90)
GFR calc non Af Amer: >90 mL/min (ref >90)
Potassium: 3.6 mEq/L (ref 3.5–5.1)
Sodium: 138 mEq/L (ref 135–145)

## 2012-01-11 LAB — CBC
MCH: 30.6 pg (ref 26.0–34.0)
MCHC: 34.3 g/dL (ref 30.0–36.0)
Platelets: 292 10*3/uL (ref 150–400)
RBC: 4.41 MIL/uL (ref 3.87–5.11)
RDW: 12.2 % (ref 11.5–15.5)

## 2012-01-11 LAB — DIFFERENTIAL
Basophils Absolute: 0 10*3/uL (ref 0.0–0.1)
Basophils Relative: 0 % (ref 0–1)
Eosinophils Absolute: 0 10*3/uL (ref 0.0–0.7)
Lymphs Abs: 1 10*3/uL (ref 0.7–4.0)
Neutrophils Relative %: 89 % — ABNORMAL HIGH (ref 43–77)

## 2012-01-11 MED ORDER — SODIUM CHLORIDE 0.9 % IV BOLUS (SEPSIS)
1000.0000 mL | Freq: Once | INTRAVENOUS | Status: AC
  Administered 2012-01-11: 1000 mL via INTRAVENOUS

## 2012-01-11 MED ORDER — KETOROLAC TROMETHAMINE 30 MG/ML IJ SOLN
30.0000 mg | Freq: Once | INTRAMUSCULAR | Status: AC
  Administered 2012-01-11: 30 mg via INTRAVENOUS
  Filled 2012-01-11: qty 1

## 2012-01-11 NOTE — ED Provider Notes (Signed)
This chart was scribed for Gerhard Munch, MD by Wallis Mart. The patient was seen in room APA18/APA18 and the patient's care was started at 8:23 PM.   CSN: [id number]  Arrival date & time 01/11/12  2000   First MD Initiated Contact with Patient 01/11/12 2014      Chief Complaint  Patient presents with  . Chest Pain     HPI  Tiffany Goodwin is a 21 y.o. female who presents to the Emergency Department complaining of sub-acute onset, persistence of constant, gradually worsening diffuse body aches with non-radiating cp onset approximately 5 hours ago.  Pt had a similar episode a few weeks ago, but states that the current episode is worse.  Pt describs the pain as pressure like.  Pt c/o associated nausea, chills, headaches.  Pt took advil w/ mild relief of sx.  Nothing worsens the pain. No dyspnea, no syncope, near syncope, no vomiting.  She notes multiple sick contacts. There are no other associated symptoms and no other alleviating or aggravating factors.  Past Medical History  Diagnosis Date  . Anxiety     Past Surgical History  Procedure Date  . Tonsillectomy   . Wisdom tooth extraction   . Wisdom tooth extraction     Family History  Problem Relation Age of Onset  . Anesthesia problems Neg Hx   . Hypotension Neg Hx   . Malignant hyperthermia Neg Hx   . Pseudochol deficiency Neg Hx     History  Substance Use Topics  . Smoking status: Never Smoker   . Smokeless tobacco: Not on file  . Alcohol Use: No    OB History    Grav Para Term Preterm Abortions TAB SAB Ect Mult Living   1 1 1  0 0 0 0 0 0 1      Review of Systems  Constitutional: Positive for chills. Negative for fever.  HENT: Negative for rhinorrhea and neck pain.   Eyes: Negative for pain and discharge.  Respiratory: Negative for cough and shortness of breath.   Cardiovascular: Positive for chest pain. Negative for palpitations.  Gastrointestinal: Positive for nausea. Negative for vomiting, abdominal  pain and diarrhea.  Genitourinary: Negative for dysuria.  Musculoskeletal: Positive for myalgias. Negative for back pain.  Skin: Negative for rash.  Neurological: Positive for headaches. Negative for weakness.    Allergies  Review of patient's allergies indicates no known allergies.  Home Medications   Current Outpatient Rx  Name Route Sig Dispense Refill  . FAMOTIDINE 20 MG PO TABS Oral Take 20 mg by mouth 2 (two) times daily as needed. heartburn     . HYDROXYZINE PAMOATE 25 MG PO CAPS Oral Take 25 mg by mouth 3 (three) times daily as needed. anxiety    . ADULT MULTIVITAMIN W/MINERALS CH Oral Take 1 tablet by mouth 2 (two) times daily.        BP 121/77  Pulse 135  Temp(Src) 99.1 F (37.3 C) (Oral)  Resp 18  Ht 5\' 7"  (1.702 m)  Wt 140 lb (63.504 kg)  BMI 21.93 kg/m2  SpO2 99%  Breastfeeding? Unknown  Physical Exam  Nursing note and vitals reviewed. Constitutional: She is oriented to person, place, and time. She appears well-developed and well-nourished. No distress.  HENT:  Head: Normocephalic and atraumatic.  Eyes: EOM are normal. Pupils are equal, round, and reactive to light.  Neck: Normal range of motion. Neck supple. No tracheal deviation present.  Cardiovascular: Normal rate and regular rhythm.   Pulmonary/Chest:  Effort normal and breath sounds normal. No respiratory distress.  Abdominal: Soft. She exhibits no distension.  Musculoskeletal: Normal range of motion. She exhibits no edema.  Neurological: She is alert and oriented to person, place, and time. No sensory deficit.  Skin: Skin is warm and dry.  Psychiatric: She has a normal mood and affect. Her behavior is normal.    ED Course  Procedures (including critical care time) DIAGNOSTIC STUDIES: Oxygen Saturation is 99% on room air, normal by my interpretation.   Cardiac monitor 90-SR  CXR reviewed by me   Date: 01/11/2012  Rate: 109  Rhythm: sinus tachycardia  QRS Axis: normal  Intervals: normal   ST/T Wave abnormalities: nonspecific T wave changes  Conduction Disutrbances:none  Narrative Interpretation:   Old EKG Reviewed: none available     COORDINATION OF CARE:  8:30 PM: pt evaluated, physical exam complete  Labs Reviewed - No data to display No results found.   No diagnosis found.    MDM  This generally well-appearing young female presents with several hours of generalized discomfort, nausea, focal chest discomfort diffusely in the anterior chest.  On exam she is in no distress, with no hypoxia, tachypnea, no lower extremity edema.  The patient's vital signs are notable for tachycardia, which decreases significantly when not being observed.  The patient has a notable family history of early cardiac disease, her father has had stents and pacemaker before the age of 12.  Although the patient is tachycardic, her vital signs are otherwise unremarkable her physical presentation is unremarkable.  The patient has no physical stigmata suggestive of PE, though her tachycardia is noted.  The absence of hypoxia, dyspnea, tachypnea his reassuring for the very low probability of this cause.  The patient's labs are notable for mild leukocytosis which is lower than her typical reading.     Gerhard Munch, MD 01/11/12 2137

## 2012-01-11 NOTE — Discharge Instructions (Signed)
A specific cause of your symptoms was not demonstrated to connect emergency department evaluation.  Her symptoms are most consistent with a viral illness.  However, it is very important that you continue to have your condition evaluated and monitored by your primary care physician.  Please make sure to call in the morning to arrange appropriate followup care.  If you develop any new, or concerning changes in your condition, please return to the emergency department immediately.

## 2012-01-11 NOTE — ED Notes (Signed)
Chest pain with body aches , nausea no vomiting

## 2012-11-13 ENCOUNTER — Emergency Department (HOSPITAL_COMMUNITY): Admission: EM | Admit: 2012-11-13 | Discharge: 2012-11-13 | Discharge status: Against Medical Advice | Payer: Self-pay

## 2012-11-20 ENCOUNTER — Encounter (HOSPITAL_COMMUNITY): Payer: Self-pay | Admitting: *Deleted

## 2012-11-20 ENCOUNTER — Emergency Department (HOSPITAL_COMMUNITY)
Admission: EM | Admit: 2012-11-20 | Discharge: 2012-11-20 | Disposition: A | Discharge status: Home / Self Care | Payer: Medicaid Other | Attending: Emergency Medicine | Admitting: Emergency Medicine

## 2012-11-20 DIAGNOSIS — R42 Dizziness and giddiness: Secondary | ICD-10-CM | POA: Insufficient documentation

## 2012-11-20 DIAGNOSIS — Z3202 Encounter for pregnancy test, result negative: Secondary | ICD-10-CM | POA: Insufficient documentation

## 2012-11-20 DIAGNOSIS — R112 Nausea with vomiting, unspecified: Secondary | ICD-10-CM | POA: Insufficient documentation

## 2012-11-20 DIAGNOSIS — R6889 Other general symptoms and signs: Secondary | ICD-10-CM

## 2012-11-20 DIAGNOSIS — Z8659 Personal history of other mental and behavioral disorders: Secondary | ICD-10-CM | POA: Insufficient documentation

## 2012-11-20 DIAGNOSIS — R197 Diarrhea, unspecified: Secondary | ICD-10-CM | POA: Insufficient documentation

## 2012-11-20 LAB — CBC WITH DIFFERENTIAL/PLATELET
Basophils Absolute: 0 10*3/uL (ref 0.0–0.1)
Basophils Relative: 0 % (ref 0–1)
Eosinophils Absolute: 0 10*3/uL (ref 0.0–0.7)
Eosinophils Relative: 0 % (ref 0–5)
HCT: 43.2 % (ref 36.0–46.0)
Hemoglobin: 14.7 g/dL (ref 12.0–15.0)
Lymphocytes Relative: 4 % — ABNORMAL LOW (ref 12–46)
Lymphs Abs: 0.5 10*3/uL — ABNORMAL LOW (ref 0.7–4.0)
MCH: 31 pg (ref 26.0–34.0)
MCHC: 34 g/dL (ref 30.0–36.0)
MCV: 91.1 fL (ref 78.0–100.0)
Monocytes Absolute: 0.5 10*3/uL (ref 0.1–1.0)
Monocytes Relative: 4 % (ref 3–12)
Neutro Abs: 12.2 10*3/uL — ABNORMAL HIGH (ref 1.7–7.7)
Neutrophils Relative %: 92 % — ABNORMAL HIGH (ref 43–77)
Platelets: 293 10*3/uL (ref 150–400)
RBC: 4.74 MIL/uL (ref 3.87–5.11)
RDW: 12.6 % (ref 11.5–15.5)
WBC: 13.2 10*3/uL — ABNORMAL HIGH (ref 4.0–10.5)

## 2012-11-20 LAB — URINALYSIS, ROUTINE W REFLEX MICROSCOPIC
Bilirubin Urine: NEGATIVE
Glucose, UA: NEGATIVE mg/dL
Hgb urine dipstick: NEGATIVE
Ketones, ur: NEGATIVE mg/dL
Nitrite: NEGATIVE
Protein, ur: NEGATIVE mg/dL
Specific Gravity, Urine: 1.025 (ref 1.005–1.030)
Urobilinogen, UA: 0.2 mg/dL (ref 0.0–1.0)
pH: 6 (ref 5.0–8.0)

## 2012-11-20 LAB — BASIC METABOLIC PANEL
BUN: 16 mg/dL (ref 6–23)
CO2: 26 mEq/L (ref 19–32)
Calcium: 9.4 mg/dL (ref 8.4–10.5)
Chloride: 102 mEq/L (ref 96–112)
Creatinine, Ser: 0.61 mg/dL (ref 0.50–1.10)
GFR calc Af Amer: >90 mL/min (ref >90)
GFR calc non Af Amer: >90 mL/min (ref >90)
Glucose, Bld: 118 mg/dL — ABNORMAL HIGH (ref 70–99)
Potassium: 4 mEq/L (ref 3.5–5.1)
Sodium: 137 mEq/L (ref 135–145)

## 2012-11-20 LAB — PREGNANCY, URINE: Preg Test, Ur: NEGATIVE

## 2012-11-20 LAB — URINE MICROSCOPIC-ADD ON

## 2012-11-20 MED ORDER — KETOROLAC TROMETHAMINE 30 MG/ML IJ SOLN
15.0000 mg | Freq: Once | INTRAMUSCULAR | Status: AC
  Administered 2012-11-20: 15 mg via INTRAVENOUS
  Filled 2012-11-20: qty 1

## 2012-11-20 MED ORDER — NAPROXEN 500 MG PO TABS: 500.0000 mg | ORAL_TABLET | Freq: Two times a day (BID) | ORAL | Status: DC | PRN

## 2012-11-20 MED ORDER — ONDANSETRON HCL 4 MG/2ML IJ SOLN
4.0000 mg | Freq: Once | INTRAMUSCULAR | Status: AC
  Administered 2012-11-20: 4 mg via INTRAVENOUS
  Filled 2012-11-20: qty 2

## 2012-11-20 MED ORDER — SODIUM CHLORIDE 0.9 % IV BOLUS (SEPSIS)
1000.0000 mL | Freq: Once | INTRAVENOUS | Status: AC
  Administered 2012-11-20: 1000 mL via INTRAVENOUS

## 2012-11-20 MED ORDER — ONDANSETRON HCL 4 MG PO TABS: 4.0000 mg | ORAL_TABLET | Freq: Four times a day (QID) | ORAL | Status: DC

## 2012-11-20 NOTE — ED Notes (Signed)
NS 1L bolus inititated.

## 2012-11-20 NOTE — ED Notes (Signed)
Onset last pm of vomiting , body aches, diarrhea,  Dizzy,

## 2012-11-20 NOTE — ED Notes (Signed)
Reports diarrhea x 2 days; states vomited x 1 this morning, but denies vomiting since -states has tolerated po fluid since that time; c/o dizziness, nausea, and lower back pain; denies urinary frequency or pain with urination.

## 2012-11-20 NOTE — ED Notes (Signed)
Left in c/o spouse for transport home; alert, in no distress; instructions, prescriptions, and f/u information given/reviewed; verbalizes understanding.

## 2012-11-20 NOTE — ED Provider Notes (Signed)
History    This chart was scribed for Raeford Razor, MD, MD by Smitty Pluck, ED Scribe. The patient was seen in room APA03 and the patient's care was started at 12:57 PM.   CSN: [id number]  Arrival date & time 11/20/12  1032      Chief Complaint  Patient presents with  . Emesis    (Consider location/radiation/quality/duration/timing/severity/associated sxs/prior treatment) The history is provided by the patient. No language interpreter was used.   Tiffany Goodwin is a 22 y.o. female who presents to the Emergency Department complaining of constant, moderate generalized body aches onset 1 day ago. Pt reports that she has nausea, vomiting 1x, diarrhea and dizziness. She denies headache, dysuria, blood in urine, blood in stool, cough, chest pain, rash and any other pain.   Past Medical History  Diagnosis Date  . Anxiety     Past Surgical History  Procedure Date  . Tonsillectomy   . Wisdom tooth extraction   . Wisdom tooth extraction     Family History  Problem Relation Age of Onset  . Anesthesia problems Neg Hx   . Hypotension Neg Hx   . Malignant hyperthermia Neg Hx   . Pseudochol deficiency Neg Hx     History  Substance Use Topics  . Smoking status: Never Smoker   . Smokeless tobacco: Not on file  . Alcohol Use: No    OB History    Grav Para Term Preterm Abortions TAB SAB Ect Mult Living   1 1 1  0 0 0 0 0 0 1      Review of Systems  Gastrointestinal: Positive for nausea, vomiting and diarrhea.  All other systems reviewed and are negative.    Allergies  Review of patient's allergies indicates no known allergies.  Home Medications   Current Outpatient Rx  Name  Route  Sig  Dispense  Refill  . IBUPROFEN 200 MG PO TABS   Oral   Take 800 mg by mouth every 8 (eight) hours as needed. For pain / headache           BP 93/50  Pulse 130  Temp 97.8 F (36.6 C) (Oral)  Resp 18  Ht 5\' 7"  (1.702 m)  Wt 145 lb (65.772 kg)  BMI 22.71 kg/m2  SpO2 100%  LMP  11/02/2012  Physical Exam  Nursing note and vitals reviewed. Constitutional: She appears well-developed and well-nourished. No distress.  HENT:  Head: Normocephalic and atraumatic.  Eyes: Conjunctivae normal are normal. Right eye exhibits no discharge. Left eye exhibits no discharge.  Neck: Neck supple.  Cardiovascular: Normal rate, regular rhythm and normal heart sounds.  Exam reveals no gallop and no friction rub.   No murmur heard. Pulmonary/Chest: Effort normal and breath sounds normal. No respiratory distress.  Abdominal: Soft. She exhibits no distension. There is no tenderness.  Musculoskeletal: She exhibits no edema and no tenderness.  Neurological: She is alert.  Skin: Skin is warm and dry.  Psychiatric: She has a normal mood and affect. Her behavior is normal. Thought content normal.    ED Course  Procedures (including critical care time) DIAGNOSTIC STUDIES: Oxygen Saturation is 100% on room air, normal by my interpretation.    COORDINATION OF CARE: 12:59 PM Discussed ED treatment with pt     Labs Reviewed  URINALYSIS, ROUTINE W REFLEX MICROSCOPIC - Abnormal; Notable for the following:    Leukocytes, UA SMALL (*)     All other components within normal limits  URINE MICROSCOPIC-ADD ON -  Abnormal; Notable for the following:    Squamous Epithelial / LPF MANY (*)     Bacteria, UA FEW (*)     All other components within normal limits  CBC WITH DIFFERENTIAL - Abnormal; Notable for the following:    WBC 13.2 (*)     Neutrophils Relative 92 (*)     Neutro Abs 12.2 (*)     Lymphocytes Relative 4 (*)     Lymphs Abs 0.5 (*)     All other components within normal limits  BASIC METABOLIC PANEL - Abnormal; Notable for the following:    Glucose, Bld 118 (*)     All other components within normal limits  PREGNANCY, URINE  URINE CULTURE   No results found.   1. Flu-like symptoms       MDM  22 year old female with a flulike illness. Mildly tachycardic, otherwise  hemodynamically stable. No respiratory distress. Patient is otherwise healthy with no history of immunocompromise. Feel she is appropriate for discharge at this time. Plan Sinemet treatment. Emergent return precautions were discussed.  I personally preformed the services scribed in my presence. The recorded information has been reviewed is accurate. Raeford Razor, MD.        Raeford Razor, MD 11/20/12 [phone]

## 2012-11-21 LAB — URINE CULTURE
Colony Count: NO GROWTH
Culture: NO GROWTH

## 2014-03-18 ENCOUNTER — Emergency Department (HOSPITAL_COMMUNITY)
Admission: EM | Admit: 2014-03-18 | Discharge: 2014-03-18 | Disposition: A | Discharge status: Home / Self Care | Payer: Medicaid Other | Attending: Emergency Medicine | Admitting: Emergency Medicine

## 2014-03-18 ENCOUNTER — Encounter (HOSPITAL_COMMUNITY): Payer: Self-pay | Admitting: Emergency Medicine

## 2014-03-18 DIAGNOSIS — N390 Urinary tract infection, site not specified: Secondary | ICD-10-CM

## 2014-03-18 DIAGNOSIS — Z8659 Personal history of other mental and behavioral disorders: Secondary | ICD-10-CM | POA: Insufficient documentation

## 2014-03-18 DIAGNOSIS — B3731 Acute candidiasis of vulva and vagina: Secondary | ICD-10-CM

## 2014-03-18 DIAGNOSIS — Z792 Long term (current) use of antibiotics: Secondary | ICD-10-CM | POA: Insufficient documentation

## 2014-03-18 DIAGNOSIS — Z3202 Encounter for pregnancy test, result negative: Secondary | ICD-10-CM | POA: Insufficient documentation

## 2014-03-18 DIAGNOSIS — B373 Candidiasis of vulva and vagina: Secondary | ICD-10-CM | POA: Insufficient documentation

## 2014-03-18 LAB — URINE MICROSCOPIC-ADD ON

## 2014-03-18 LAB — WET PREP, GENITAL
CLUE CELLS WET PREP: NONE SEEN
Trich, Wet Prep: NONE SEEN

## 2014-03-18 LAB — URINALYSIS, ROUTINE W REFLEX MICROSCOPIC
Bilirubin Urine: NEGATIVE
Glucose, UA: NEGATIVE mg/dL
Hgb urine dipstick: NEGATIVE
Ketones, ur: NEGATIVE mg/dL
Nitrite: NEGATIVE
Protein, ur: NEGATIVE mg/dL
Specific Gravity, Urine: 1.02 (ref 1.005–1.030)
Urobilinogen, UA: 0.2 mg/dL (ref 0.0–1.0)
pH: 6.5 (ref 5.0–8.0)

## 2014-03-18 LAB — POC URINE PREG, ED: Preg Test, Ur: NEGATIVE

## 2014-03-18 MED ORDER — FLUCONAZOLE 100 MG PO TABS
150.0000 mg | ORAL_TABLET | Freq: Once | ORAL | Status: AC
  Administered 2014-03-18: 150 mg via ORAL
  Filled 2014-03-18: qty 2

## 2014-03-18 MED ORDER — SULFAMETHOXAZOLE-TMP DS 800-160 MG PO TABS: 1.0000 | ORAL_TABLET | Freq: Two times a day (BID) | ORAL | Status: DC

## 2014-03-18 NOTE — ED Provider Notes (Signed)
CSN: [id number]     Arrival date & time 03/18/14  2047 History   First MD Initiated Contact with Patient 03/18/14 2057     Chief Complaint  Patient presents with  . Vaginitis     (Consider location/radiation/quality/duration/timing/severity/associated sxs/prior Treatment) HPI Comments: Tiffany Goodwin is a 23 y.o. Female presenting with vaginal and perineal itching and irritation along with a small amount of white vaginal discharge. She [facility] and had a complete std screening and was diagnosed with BV, her gonorrhea and chlamydia were negative,  And is waiting on HIV and syphilis results.  She completed her last dose of flagyl yesterday but continues to have symptoms.  She denies increased urinary frequency but does have burning with urination.  She is sexually active with a single partner who is symptom free.  She has had no fever, chills, nausea, vomiting or abdominal pain. She has used otc monistat cream externally which helps with pain briefly but has been reluctant to apply it internally.     The history is provided by the patient.    Past Medical History  Diagnosis Date  . Anxiety    Past Surgical History  Procedure Laterality Date  . Tonsillectomy    . Wisdom tooth extraction    . Wisdom tooth extraction     Family History  Problem Relation Age of Onset  . Anesthesia problems Neg Hx   . Hypotension Neg Hx   . Malignant hyperthermia Neg Hx   . Pseudochol deficiency Neg Hx    History  Substance Use Topics  . Smoking status: Never Smoker   . Smokeless tobacco: Not on file  . Alcohol Use: No   OB History   Grav Para Term Preterm Abortions TAB SAB Ect Mult Living   1 1 1  0 0 0 0 0 0 1     Review of Systems  Constitutional: Negative for fever and chills.  HENT: Negative for congestion and sore throat.   Eyes: Negative.   Respiratory: Negative for chest tightness and shortness of breath.   Cardiovascular: Negative for  chest pain.  Gastrointestinal: Negative for nausea and abdominal pain.  Genitourinary: Positive for dysuria, vaginal discharge and vaginal pain. Negative for urgency, frequency and hematuria.  Musculoskeletal: Negative for arthralgias, joint swelling and neck pain.  Skin: Negative.  Negative for rash and wound.  Neurological: Negative for dizziness, weakness, light-headedness, numbness and headaches.  Psychiatric/Behavioral: Negative.       Allergies  Review of patient's allergies indicates no known allergies.  Home Medications   Prior to Admission medications   Medication Sig Start Date End Date Taking? Authorizing Provider  sulfamethoxazole-trimethoprim (BACTRIM DS) 800-160 MG per tablet Take 1 tablet by mouth 2 (two) times daily. 03/18/14   Burgess AmorJulie Arnez Stoneking, PA-C   BP 114/65  Pulse 75  Temp(Src) 97.7 F (36.5 C) (Oral)  Resp 18  Ht 5\' 7"  (1.702 m)  Wt 150 lb (68.04 kg)  BMI 23.49 kg/m2  SpO2 100%  LMP 02/17/2014 Physical Exam  Nursing note and vitals reviewed. Constitutional: She appears well-developed and well-nourished.  HENT:  Head: Normocephalic and atraumatic.  Eyes: Conjunctivae are normal.  Neck: Normal range of motion.  Cardiovascular: Normal rate, regular rhythm, normal heart sounds and intact distal pulses.   Pulmonary/Chest: Effort normal and breath sounds normal. She has no wheezes.  Abdominal: Soft. Bowel sounds are normal. There is no tenderness.  Genitourinary: Uterus normal. Uterus is not tender.  Cervix exhibits no motion tenderness, no discharge and no friability. Right adnexum displays no mass, no tenderness and no fullness. Left adnexum displays no mass, no tenderness and no fullness. There is erythema around the vagina. Vaginal discharge found.  Musculoskeletal: Normal range of motion.  Neurological: She is alert.  Skin: Skin is warm and dry.  Psychiatric: She has a normal mood and affect.    ED Course  Procedures (including critical care time) Labs  Review Labs Reviewed  WET PREP, GENITAL - Abnormal; Notable for the following:    Yeast Wet Prep HPF POC FEW (*)    WBC, Wet Prep HPF POC MANY (*)    All other components within normal limits  URINALYSIS, ROUTINE W REFLEX MICROSCOPIC - Abnormal; Notable for the following:    Leukocytes, UA SMALL (*)    All other components within normal limits  URINE MICROSCOPIC-ADD ON - Abnormal; Notable for the following:    Squamous Epithelial / LPF MANY (*)    Bacteria, UA FEW (*)    All other components within normal limits  GC/CHLAMYDIA PROBE AMP  POC URINE PREG, ED    Imaging Review No results found.   EKG Interpretation None      MDM   Final diagnoses:  UTI (lower urinary tract infection)  Yeast vaginitis    Patients labs and/or radiological studies were viewed and considered during the medical decision making and disposition process. Pt was prescribed bactrim,  Diflucan 150 mg x 1 PO given here.  Encouraged f/u with pcp if sx persist. Pt aware that gc/chlamydia cx were collected, pending.  Deferred HIV./rpr as results from health dept still pending, this was just tested last week.  The patient appears reasonably screened and/or stabilized for discharge and I doubt any other medical condition or other [facility] requiring further screening, evaluation, or treatment in the ED at this time prior to discharge.     Burgess AmorJulie Tyisha Cressy, PA-C 03/18/14 2312

## 2014-03-18 NOTE — Discharge Instructions (Signed)
Candidal Vulvovaginitis °Candidal vulvovaginitis is an infection of the vagina and vulva. The vulva is the skin around the opening of the vagina. This may cause itching and discomfort in and around the vagina.  °HOME CARE °· Only take medicine as told by your doctor. °· Do not have sex (intercourse) until the infection is healed or as told by your doctor. °· Practice safe sex. °· Tell your sex partner about your infection. °· Do not douche or use tampons. °· Wear cotton underwear. Do not wear tight pants or panty hose. °· Eat yogurt. This may help treat and prevent yeast infections. °GET HELP RIGHT AWAY IF:  °· You have a fever. °· Your problems get worse during treatment or do not get better in 3 days. °· You have discomfort, irritation, or itching in your vagina or vulva area. °· You have pain after sex. °· You start to get belly (abdominal) pain. °MAKE SURE YOU: °· Understand these instructions. °· Will watch your condition. °· Will get help right away if you are not doing well or get worse. °Document Released: 02/01/2009 Document Revised: 01/28/2012 Document Reviewed: 02/01/2009 °ExitCare® Patient Information ©2014 [employer]. °Urinary Tract Infection °Urinary tract infections (UTIs) can develop anywhere along your urinary tract. Your urinary tract is your body's drainage system for removing wastes and extra water. Your urinary tract includes two kidneys, two ureters, a bladder, and a urethra. Your kidneys are a pair of bean-shaped organs. Each kidney is about the size of your fist. They are located below your ribs, one on each side of your spine. °CAUSES °Infections are caused by microbes, which are microscopic organisms, including fungi, viruses, and bacteria. These organisms are so small that they can only be seen through a microscope. Bacteria are the microbes that most commonly cause UTIs. °SYMPTOMS  °Symptoms of UTIs may vary by age and gender of the patient and by the location of the infection.  Symptoms in young women typically include a frequent and intense urge to urinate and a painful, burning feeling in the bladder or urethra during urination. Older women and men are more likely to be tired, shaky, and weak and have muscle aches and abdominal pain. A fever may mean the infection is in your kidneys. Other symptoms of a kidney infection include pain in your back or sides below the ribs, nausea, and vomiting. °DIAGNOSIS °To diagnose a UTI, your caregiver will ask you about your symptoms. Your caregiver also will ask to provide a urine sample. The urine sample will be tested for bacteria and white blood cells. White blood cells are made by your body to help fight infection. °TREATMENT  °Typically, UTIs can be treated with medication. Because most UTIs are caused by a bacterial infection, they usually can be treated with the use of antibiotics. The choice of antibiotic and length of treatment depend on your symptoms and the type of bacteria causing your infection. °HOME CARE INSTRUCTIONS °· If you were prescribed antibiotics, take them exactly as your caregiver instructs you. Finish the medication even if you feel better after you have only taken some of the medication. °· Drink enough water and fluids to keep your urine clear or pale yellow. °· Avoid caffeine, tea, and carbonated beverages. They tend to irritate your bladder. °· Empty your bladder often. Avoid holding urine for long periods of time. °· Empty your bladder before and after sexual intercourse. °· After a bowel movement, women should cleanse from front to back. Use each tissue only once. °  once. SEEK MEDICAL CARE IF:   You have back pain.  You develop a fever.  Your symptoms do not begin to resolve within 3 days. SEEK IMMEDIATE MEDICAL CARE IF:   You have severe back pain or lower abdominal pain.  You develop chills.  You have nausea or vomiting.  You have continued burning or discomfort with urination. MAKE SURE YOU:   Understand  these instructions.  Will watch your condition.  Will get help right away if you are not doing well or get worse. Document Released: 08/15/2005 Document Revised: 05/06/2012 Document Reviewed: 12/14/2011 [facility] Patient Information 2014 [place], [place].

## 2014-03-18 NOTE — ED Notes (Signed)
Pt states "I think I have a yeast infection. I had BV and took all my antibiotics for it when all this started. It's been about 2 or 3 days".

## 2014-03-18 NOTE — ED Provider Notes (Signed)
Medical screening examination/treatment/procedure(s) were performed by non-physician practitioner and as supervising physician I was immediately available for consultation/collaboration.   EKG Interpretation None      Ladashia Demarinis, MD, FACEP   Levern Pitter L Makynlie Rossini, MD 03/18/14 2344 

## 2014-03-20 LAB — GC/CHLAMYDIA PROBE AMP
CT Probe RNA: NEGATIVE
GC Probe RNA: NEGATIVE

## 2014-06-14 ENCOUNTER — Emergency Department (HOSPITAL_COMMUNITY)
Admission: EM | Admit: 2014-06-14 | Discharge: 2014-06-14 | Disposition: A | Discharge status: Home / Self Care | Payer: Medicaid Other | Attending: Emergency Medicine | Admitting: Emergency Medicine

## 2014-06-14 ENCOUNTER — Encounter (HOSPITAL_COMMUNITY): Payer: Self-pay | Admitting: Emergency Medicine

## 2014-06-14 DIAGNOSIS — S39012A Strain of muscle, fascia and tendon of lower back, initial encounter: Secondary | ICD-10-CM

## 2014-06-14 DIAGNOSIS — S335XXA Sprain of ligaments of lumbar spine, initial encounter: Secondary | ICD-10-CM | POA: Insufficient documentation

## 2014-06-14 DIAGNOSIS — Y9301 Activity, walking, marching and hiking: Secondary | ICD-10-CM | POA: Insufficient documentation

## 2014-06-14 DIAGNOSIS — Y929 Unspecified place or not applicable: Secondary | ICD-10-CM | POA: Insufficient documentation

## 2014-06-14 DIAGNOSIS — Z79899 Other long term (current) drug therapy: Secondary | ICD-10-CM | POA: Insufficient documentation

## 2014-06-14 DIAGNOSIS — IMO0002 Reserved for concepts with insufficient information to code with codable children: Secondary | ICD-10-CM | POA: Insufficient documentation

## 2014-06-14 DIAGNOSIS — Z3202 Encounter for pregnancy test, result negative: Secondary | ICD-10-CM | POA: Insufficient documentation

## 2014-06-14 DIAGNOSIS — X500XXA Overexertion from strenuous movement or load, initial encounter: Secondary | ICD-10-CM | POA: Insufficient documentation

## 2014-06-14 DIAGNOSIS — Z8659 Personal history of other mental and behavioral disorders: Secondary | ICD-10-CM | POA: Insufficient documentation

## 2014-06-14 LAB — PREGNANCY, URINE: PREG TEST UR: NEGATIVE

## 2014-06-14 LAB — URINALYSIS, ROUTINE W REFLEX MICROSCOPIC
Bilirubin Urine: NEGATIVE
Glucose, UA: NEGATIVE mg/dL
Hgb urine dipstick: NEGATIVE
Ketones, ur: NEGATIVE mg/dL
LEUKOCYTES UA: NEGATIVE
NITRITE: NEGATIVE
PROTEIN: NEGATIVE mg/dL
SPECIFIC GRAVITY, URINE: 1.02 (ref 1.005–1.030)
UROBILINOGEN UA: 0.2 mg/dL (ref 0.0–1.0)
pH: 6.5 (ref 5.0–8.0)

## 2014-06-14 MED ORDER — IBUPROFEN 600 MG PO TABS: 600.0000 mg | ORAL_TABLET | Freq: Four times a day (QID) | ORAL | Status: DC | PRN

## 2014-06-14 MED ORDER — METHOCARBAMOL 500 MG PO TABS: 1000.0000 mg | ORAL_TABLET | Freq: Four times a day (QID) | ORAL | Status: AC

## 2014-06-14 MED ORDER — HYDROCODONE-ACETAMINOPHEN 5-325 MG PO TABS: 1.0000 | ORAL_TABLET | ORAL | Status: DC | PRN

## 2014-06-14 NOTE — ED Notes (Signed)
Pt states lower back pain since waking up this morning and began hurting worse while at work. Pt states she may have injured her back while picking up a bucket of water yesterday.

## 2014-06-14 NOTE — ED Provider Notes (Signed)
CSN: [id number]     Arrival date & time 06/14/14  [id number] History   First MD Initiated Contact with Patient 06/14/14 1042    This chart was scribed for non-physician practitioner Burgess AmorJulie Charika Mikelson, PA-C working with Benny LennertJoseph L Zammit, MD, by Andrew Auaven Small, ED Scribe. This patient was seen in room APFT24/APFT24 and the patient's care was started at 11:05 AM. Chief Complaint  Patient presents with  . Back Pain   Patient is a 23 y.o. female presenting with back pain. The history is provided by the patient. No language interpreter was used.  Back Pain Location:  Lumbar spine Quality: sharp and dull. Radiates to:  Does not radiate Pain severity:  Moderate Pain is:  Same all the time Onset quality:  Sudden Duration:  1 day Timing:  Constant Chronicity:  New Context: lifting heavy objects   Relieved by:  Nothing Worsened by:  Lying down and movement Ineffective treatments:  NSAIDs and OTC medications Associated symptoms: no abdominal pain, no bladder incontinence, no bowel incontinence, no dysuria, no fever, no leg pain, no numbness, no tingling and no weakness   Abdominal pain:    Quality:  Sharp   Severity:  Mild   Onset quality:  Sudden   Duration:  2 days   Chronicity:  New  Tiffany Goodwin is a 23 y.o. female who presents to the Emergency Department complaining of non radiating sudden lower back pain.  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 Pt describes pain as dull and sharp interchangeably with walking and lying down. Pt has taken tylenol and motrin. She last had motrin last night and tylenol this morning. Pt denies h/o back pain. She denies bowel and bladder incontinence. Pt denies numbness, tingling and weakness in legs.  She also reports possible pregnancy.  Her LMP was 05/25/14 and lighter than normal.  She has not taken a home pregnancy test but is requesting one here.  She denies nausea, vomiting, breast tenderness, pelvic pain.    Past  Medical History  Diagnosis Date  . Anxiety    Past Surgical History  Procedure Laterality Date  . Tonsillectomy    . Wisdom tooth extraction    . Wisdom tooth extraction     Family History  Problem Relation Age of Onset  . Anesthesia problems Neg Hx   . Hypotension Neg Hx   . Malignant hyperthermia Neg Hx   . Pseudochol deficiency Neg Hx    History  Substance Use Topics  . Smoking status: Never Smoker   . Smokeless tobacco: Not on file  . Alcohol Use: No   OB History   Grav Para Term Preterm Abortions TAB SAB Ect Mult Living   1 1 1  0 0 0 0 0 0 1     Review of Systems  Constitutional: Negative for fever, chills, activity change and appetite change.  Gastrointestinal: Negative for nausea, vomiting, abdominal pain, diarrhea, constipation, blood in stool and bowel incontinence.  Genitourinary: Negative for bladder incontinence, dysuria, urgency, frequency, hematuria, decreased urine volume and difficulty urinating.  Musculoskeletal: Positive for back pain and myalgias. Negative for gait problem.  Neurological: Negative for tingling, weakness and numbness.    Allergies  Review of patient's allergies indicates no known allergies.  Home Medications   Prior to Admission medications   Medication Sig Start Date End Date Taking? Authorizing Provider  HYDROcodone-acetaminophen (NORCO/VICODIN) 5-325 MG per tablet Take 1 tablet by mouth every 4 (four) hours as needed.  06/14/14   Burgess Amor, PA-C  ibuprofen (ADVIL,MOTRIN) 600 MG tablet Take 1 tablet (600 mg total) by mouth every 6 (six) hours as needed. 06/14/14   Burgess Amor, PA-C  methocarbamol (ROBAXIN) 500 MG tablet Take 2 tablets (1,000 mg total) by mouth 4 (four) times daily. 06/14/14 06/24/14  Burgess Amor, PA-C   Triage Vitals-BP 123/75  Pulse 73  Temp(Src) 98.9 F (37.2 C) (Oral)  Resp 16  Ht 5\' 8"  (1.727 m)  Wt 145 lb (65.772 kg)  BMI 22.05 kg/m2  SpO2 100%  LMP 05/25/2014 Physical Exam  Nursing note and vitals  reviewed. Constitutional: She is oriented to person, place, and time. She appears well-developed and well-nourished. No distress.  HENT:  Head: Normocephalic and atraumatic.  Eyes: Conjunctivae and EOM are normal.  Neck: Normal range of motion. Neck supple.  Cardiovascular: Normal rate and intact distal pulses.   Pedal pulses normal.  Pulmonary/Chest: Effort normal.  Abdominal: Soft. Bowel sounds are normal. She exhibits no distension and no mass.  Musculoskeletal: Normal range of motion. She exhibits no edema.       Lumbar back: She exhibits tenderness. She exhibits no swelling, no edema and no spasm.  Left para lumbar tender to palpation without deformity. No midline pain.  Neurological: She is alert and oriented to person, place, and time. She has normal strength. She displays no atrophy and no tremor. No sensory deficit. Gait normal.  Reflex Scores:      Patellar reflexes are 2+ on the right side and 2+ on the left side.      Achilles reflexes are 2+ on the right side and 2+ on the left side. No strength deficit noted in hip and knee flexor and extensor muscle groups.  Ankle flexion and extension intact.  Skin: Skin is warm and dry.  Psychiatric: She has a normal mood and affect. Her behavior is normal.    ED Course  Procedures (including critical care time) DIAGNOSTIC STUDIES: Oxygen Saturation is 100% on RA, normal by my interpretation.    COORDINATION OF CARE: 11:21 AM- Pt advised of plan for treatment and pt agrees.  Labs Review Labs Reviewed  URINALYSIS, ROUTINE W REFLEX MICROSCOPIC  PREGNANCY, URINE    Imaging Review No results found.   EKG Interpretation None      MDM   Final diagnoses:  Lumbar strain, initial encounter    Patients labs and/or radiological studies were viewed and considered during the medical decision making and disposition process. No neuro deficit on exam or by history to suggest emergent or surgical presentation.  Also discussed  worsened sx that should prompt immediate re-evaluation including distal weakness, bowel/bladder retention/incontinence.  Pt was prescribed hydrocodone, ibuprofen, robaxin,  Activity as tolerated, heat tx. F/u with pcp if not improved over the next 5 days.      I personally performed the services described in this documentation, which was scribed in my presence. The recorded information has been reviewed and is accurate.      Burgess Amor, PA-C 06/16/14 1435

## 2014-06-14 NOTE — Discharge Instructions (Signed)
Lumbosacral Strain Lumbosacral strain is a strain of any of the parts that make up your lumbosacral vertebrae. Your lumbosacral vertebrae are the bones that make up the lower third of your backbone. Your lumbosacral vertebrae are held together by muscles and tough, fibrous tissue (ligaments).  CAUSES  A sudden blow to your back can cause lumbosacral strain. Also, anything that causes an excessive stretch of the muscles in the low back can cause this strain. This is typically seen when people exert themselves strenuously, fall, lift heavy objects, bend, or crouch repeatedly. RISK FACTORS  Physically demanding work.  Participation in pushing or pulling sports or sports that require a sudden twist of the back (tennis, golf, baseball).  Weight lifting.  Excessive lower back curvature.  Forward-tilted pelvis.  Weak back or abdominal muscles or both.  Tight hamstrings. SIGNS AND SYMPTOMS  Lumbosacral strain may cause pain in the area of your injury or pain that moves (radiates) down your leg.  DIAGNOSIS Your health care provider can often diagnose lumbosacral strain through a physical exam. In some cases, you may need tests such as X-ray exams.  TREATMENT  Treatment for your lower back injury depends on many factors that your clinician will have to evaluate. However, most treatment will include the use of anti-inflammatory medicines. HOME CARE INSTRUCTIONS   Avoid hard physical activities (tennis, racquetball, waterskiing) if you are not in proper physical condition for it. This may aggravate or create problems.  If you have a back problem, avoid sports requiring sudden body movements. Swimming and walking are generally safer activities.  Maintain good posture.  Maintain a healthy weight.  For acute conditions, you may put ice on the injured area.  Put ice in a plastic bag.  Place a towel between your skin and the bag.  Leave the ice on for 20 minutes, 2-3 times a day.  When the  low back starts healing, stretching and strengthening exercises may be recommended. SEEK MEDICAL CARE IF:  Your back pain is getting worse.  You experience severe back pain not relieved with medicines. SEEK IMMEDIATE MEDICAL CARE IF:   You have numbness, tingling, weakness, or problems with the use of your arms or legs.  There is a change in bowel or bladder control.  You have increasing pain in any area of the body, including your belly (abdomen).  You notice shortness of breath, dizziness, or feel faint.  You feel sick to your stomach (nauseous), are throwing up (vomiting), or become sweaty.  You notice discoloration of your toes or legs, or your feet get very cold. MAKE SURE YOU:   Understand these instructions.  Will watch your condition.  Will get help right away if you are not doing well or get worse. Document Released: 08/15/2005 Document Revised: 11/10/2013 Document Reviewed: 06/24/2013 [facility] Patient Information 2015 [employer], [place]. This information is not intended to replace advice given to you by your health care provider. Make sure you discuss any questions you have with your health care provider.     Do not drive within 4 hours of taking hydrocodone as this will make you drowsy.  Avoid lifting,  Bending,  Twisting or any other activity that worsens your pain over the next week.  Apply an  icepack  to your lower back for 10-15 minutes every 2 hours for the next 2 days.  You should get rechecked if your symptoms are not better over the next 5 days,  Or you develop increased pain,  Weakness in your leg(s)  of bladder or bowel function - these are symptoms of a worse injury. ° ° °

## 2014-06-16 NOTE — ED Provider Notes (Signed)
Medical screening examination/treatment/procedure(s) were performed by non-physician practitioner and as supervising physician I was immediately available for consultation/collaboration.   EKG Interpretation None        Jackalynn Art L Shelbylynn Walczyk, MD 06/16/14 1451 

## 2014-09-20 ENCOUNTER — Encounter (HOSPITAL_COMMUNITY): Payer: Self-pay | Admitting: Emergency Medicine

## 2014-10-21 ENCOUNTER — Ambulatory Visit (INDEPENDENT_AMBULATORY_CARE_PROVIDER_SITE_OTHER): Payer: BC Managed Care – PPO | Admitting: Adult Health

## 2014-10-21 ENCOUNTER — Encounter: Payer: Self-pay | Admitting: Adult Health

## 2014-10-21 VITALS — BP 116/72 | Ht 67.0 in | Wt 148.5 lb

## 2014-10-21 DIAGNOSIS — B9689 Other specified bacterial agents as the cause of diseases classified elsewhere: Secondary | ICD-10-CM | POA: Insufficient documentation

## 2014-10-21 DIAGNOSIS — A499 Bacterial infection, unspecified: Secondary | ICD-10-CM

## 2014-10-21 DIAGNOSIS — N898 Other specified noninflammatory disorders of vagina: Secondary | ICD-10-CM

## 2014-10-21 DIAGNOSIS — N760 Acute vaginitis: Secondary | ICD-10-CM

## 2014-10-21 HISTORY — DX: Other specified bacterial agents as the cause of diseases classified elsewhere: B96.89

## 2014-10-21 HISTORY — DX: Other specified noninflammatory disorders of vagina: N89.8

## 2014-10-21 LAB — POCT WET PREP (WET MOUNT): WBC, Wet Prep HPF POC: POSITIVE

## 2014-10-21 MED ORDER — METRONIDAZOLE 500 MG PO TABS: 500.0000 mg | ORAL_TABLET | Freq: Two times a day (BID) | ORAL | Status: DC

## 2014-10-21 NOTE — Patient Instructions (Signed)
Bacterial Vaginosis Bacterial vaginosis is a vaginal infection that occurs when the normal balance of bacteria in the vagina is disrupted. It results from an overgrowth of certain bacteria. This is the most common vaginal infection in women of childbearing age. Treatment is important to prevent complications, especially in pregnant women, as it can cause a premature delivery. CAUSES  Bacterial vaginosis is caused by an increase in harmful bacteria that are normally present in smaller amounts in the vagina. Several different kinds of bacteria can cause bacterial vaginosis. However, the reason that the condition develops is not fully understood. RISK FACTORS Certain activities or behaviors can put you at an increased risk of developing bacterial vaginosis, including:  Having a new sex partner or multiple sex partners.  Douching.  Using an intrauterine device (IUD) for contraception. Women do not get bacterial vaginosis from toilet seats, bedding, swimming pools, or contact with objects around them. SIGNS AND SYMPTOMS  Some women with bacterial vaginosis have no signs or symptoms. Common symptoms include:  Grey vaginal discharge.  A fishlike odor with discharge, especially after sexual intercourse.  Itching or burning of the vagina and vulva.  Burning or pain with urination. DIAGNOSIS  Your health care provider will take a medical history and examine the vagina for signs of bacterial vaginosis. A sample of vaginal fluid may be taken. Your health care provider will look at this sample under a microscope to check for bacteria and abnormal cells. A vaginal pH test may also be done.  TREATMENT  Bacterial vaginosis may be treated with antibiotic medicines. These may be given in the form of a pill or a vaginal cream. A second round of antibiotics may be prescribed if the condition comes back after treatment.  HOME CARE INSTRUCTIONS   Only take over-the-counter or prescription medicines as  directed by your health care provider.  If antibiotic medicine was prescribed, take it as directed. Make sure you finish it even if you start to feel better.  Do not have sex until treatment is completed.  Tell all sexual partners that you have a vaginal infection. They should see their health care provider and be treated if they have problems, such as a mild rash or itching.  Practice safe sex by using condoms and only having one sex partner. SEEK MEDICAL CARE IF:   Your symptoms are not improving after 3 days of treatment.  You have increased discharge or pain.  You have a fever. MAKE SURE YOU:   Understand these instructions.  Will watch your condition.  Will get help right away if you are not doing well or get worse. FOR MORE INFORMATION  Centers for Disease Control and Prevention, Division of STD Prevention: SolutionApps.co.zawww.cdc.gov/std American Sexual Health Association (ASHA): www.ashastd.org  Document Released: 11/05/2005 Document Revised: 08/26/2013 Document Reviewed: 06/17/2013 [facility]ExitCare Patient Information 2015 [place], [place]. This information is not intended to replace advice given to you by your health care provider. Make sure you discuss any questions you have with your health care provider. Return in 4 weeks for Pap No alcohol with flagyl

## 2014-10-21 NOTE — Progress Notes (Signed)
Subjective:     Patient ID: June LeapAmber N Mondry, female   DOB: 11/12/1991, 23 y.o.   MRN: [id number]  HPI Joice Loftsmber is a 23 year old white female in complaining of vaginal discharge with odor for 2-3 weeks.No new partners, was treated at clinic in last few months.   Review of Systems See HPI Reviewed past medical,surgical, social and family history. Reviewed medications and allergies.     Objective:   Physical Exam BP 116/72 mmHg  Ht 5\' 7"  (1.702 m)  Wt 148 lb 8 oz (67.359 kg)  BMI 23.25 kg/m2  LMP 09/22/2014   Skin warm and dry.Pelvic: external genitalia is normal in appearance, vagina: white discharge with odor, cervix:smooth and bulbous, uterus: normal size, shape and contour, non tender, no masses felt, adnexa: no masses or tenderness noted. Wet prep: + for clue cells and +WBCs.  Assessment:     Vaginal discharge BV    Plan:     Rx flagyl 500 mg 1 bid x 7 days, with 1 refill, no alcohol, review handout on BV   Follow up in 4 weeks for pap and physical

## 2014-11-18 ENCOUNTER — Encounter: Payer: Self-pay | Admitting: Adult Health

## 2014-11-18 ENCOUNTER — Ambulatory Visit (INDEPENDENT_AMBULATORY_CARE_PROVIDER_SITE_OTHER): Payer: BC Managed Care – PPO | Admitting: Adult Health

## 2014-11-18 ENCOUNTER — Other Ambulatory Visit (HOSPITAL_COMMUNITY)
Admission: RE | Admit: 2014-11-18 | Discharge: 2014-11-18 | Disposition: A | Discharge status: Home / Self Care | Payer: BC Managed Care – PPO | Source: Ambulatory Visit | Attending: Adult Health | Admitting: Adult Health

## 2014-11-18 VITALS — BP 110/72 | HR 78 | Ht 68.0 in | Wt 148.0 lb

## 2014-11-18 DIAGNOSIS — Z01411 Encounter for gynecological examination (general) (routine) with abnormal findings: Secondary | ICD-10-CM | POA: Insufficient documentation

## 2014-11-18 DIAGNOSIS — Z113 Encounter for screening for infections with a predominantly sexual mode of transmission: Secondary | ICD-10-CM | POA: Insufficient documentation

## 2014-11-18 DIAGNOSIS — R102 Pelvic and perineal pain: Secondary | ICD-10-CM | POA: Insufficient documentation

## 2014-11-18 DIAGNOSIS — Z01419 Encounter for gynecological examination (general) (routine) without abnormal findings: Secondary | ICD-10-CM

## 2014-11-18 HISTORY — DX: Pelvic and perineal pain: R10.2

## 2014-11-18 NOTE — Progress Notes (Addendum)
Patient ID: Tiffany Goodwin, female   DOB: 09/17/1991, 23 y.o.   MRN: [id number] History of Present Illness: Tiffany Goodwin is a 23 year old white female in for pap and physical and she complains of some pain in low pelvic area more to left, it comes and goes and is not associated with anything.It has been doing this a while, she is not using birth control and says it is Ok if she gets pregnant, has 23 year old.   Current Medications, Allergies, Past Medical History, Past Surgical History, Family History and Social History [facility].     Review of Systems: Patient denies any headaches, blurred vision, shortness of breath, chest pain, abdominal pain, problems with bowel movements, urination, or intercourse. No joint pain or mood swings, see HPI.    Physical Exam:BP 110/72 mmHg  Pulse 78  Ht 5\' 8"  (1.727 m)  Wt 148 lb (67.132 kg)  BMI 22.51 kg/m2  LMP 10/25/2014 General:  Well developed, well nourished, no acute distress Skin:  Warm and dry Neck:  Midline trachea, normal thyroid Lungs; Clear to auscultation bilaterally Breast:  No dominant palpable mass, retraction, or nipple discharge Cardiovascular: Regular rate and rhythm Abdomen:  Soft, non tender, no hepatosplenomegaly Pelvic:  External genitalia is normal in appearance.  The vagina is normal in appearance. The cervix is bulbous. Pap with GC/CHL performed. Uterus is felt to be normal size, shape, and contour.  No                adnexal masses, some LLQ tenderness noted. Extremities:  No swelling or varicosities noted Psych:  No mood changes,alert and cooperative, seems happy   Impression: Well woman gyn exam with pap Pelvic pain    Plan: Return in 2 weeks for gyn US  Physical in 1 year Review handout on pelvic pain

## 2014-11-18 NOTE — Patient Instructions (Signed)
Return in 2 weeks for US and see me Physical in 1 year Pelvic Pain Female pelvic pain can be caused by many different things and start from a variety of places. Pelvic pain refers to pain that is located in the lower half of the abdomen and between your hips. The pain may occur over a short period of time (acute) or may be reoccurring (chronic). The cause of pelvic pain may be related to disorders affecting the female reproductive organs (gynecologic), but it may also be related to the bladder, kidney stones, an intestinal complication, or muscle or skeletal problems. Getting help right away for pelvic pain is important, especially if there has been severe, sharp, or a sudden onset of unusual pain. It is also important to get help right away because some types of pelvic pain can be life threatening.  CAUSES  Below are only some of the causes of pelvic pain. The causes of pelvic pain can be in one of several categories.   Gynecologic.  Pelvic inflammatory disease.  Sexually transmitted infection.  Ovarian cyst or a twisted ovarian ligament (ovarian torsion).  Uterine lining that grows outside the uterus (endometriosis).  Fibroids, cysts, or tumors.  Ovulation.  Pregnancy.  Pregnancy that occurs outside the uterus (ectopic pregnancy).  Miscarriage.  Labor.  Abruption of the placenta or ruptured uterus.  Infection.  Uterine infection (endometritis).  Bladder infection.  Diverticulitis.  Miscarriage related to a uterine infection (septic abortion).  Bladder.  Inflammation of the bladder (cystitis).  Kidney stone(s).  Gastrointestinal.  Constipation.  Diverticulitis.  Neurologic.  Trauma.  Feeling pelvic pain because of mental or emotional causes (psychosomatic).  Cancers of the bowel or pelvis. EVALUATION  Your caregiver will want to take a careful history of your concerns. This includes recent changes in your health, a careful gynecologic history of your  periods (menses), and a sexual history. Obtaining your family history and medical history is also important. Your caregiver may suggest a pelvic exam. A pelvic exam will help identify the location and severity of the pain. It also helps in the evaluation of which organ system may be involved. In order to identify the cause of the pelvic pain and be properly treated, your caregiver may order tests. These tests may include:   A pregnancy test.  Pelvic ultrasonography.  An X-ray exam of the abdomen.  A urinalysis or evaluation of vaginal discharge.  Blood tests. HOME CARE INSTRUCTIONS   Only take over-the-counter or prescription medicines for pain, discomfort, or fever as directed by your caregiver.   Rest as directed by your caregiver.   Eat a balanced diet.   Drink enough fluids to make your urine clear or pale yellow, or as directed.   Avoid sexual intercourse if it causes pain.   Apply warm or cold compresses to the lower abdomen depending on which one helps the pain.   Avoid stressful situations.   Keep a journal of your pelvic pain. Write down when it started, where the pain is located, and if there are things that seem to be associated with the pain, such as food or your menstrual cycle.  Follow up with your caregiver as directed.  SEEK MEDICAL CARE IF:  Your medicine does not help your pain.  You have abnormal vaginal discharge. SEEK IMMEDIATE MEDICAL CARE IF:   You have heavy bleeding from the vagina.   Your pelvic pain increases.   You feel light-headed or faint.   You have chills.   You have pain  with urination or blood in your urine.   You have uncontrolled diarrhea or vomiting.   You have a fever or persistent symptoms for more than 3 days.  You have a fever and your symptoms suddenly get worse.   You are being physically or sexually abused.  MAKE SURE YOU:  Understand these instructions.  Will watch your condition.  Will get help  if you are not doing well or get worse. Document Released: 10/02/2004 Document Revised: 03/22/2014 Document Reviewed: 02/25/2012 [facility]ExitCare Patient Information 2015 [place]Care, [place]. This information is not intended to replace advice given to you by your health care provider. Make sure you discuss any questions you have with your health care provider.

## 2014-11-19 DIAGNOSIS — J189 Pneumonia, unspecified organism: Secondary | ICD-10-CM

## 2014-11-19 HISTORY — DX: Pneumonia, unspecified organism: J18.9

## 2014-11-23 LAB — CYTOLOGY - PAP

## 2014-11-30 ENCOUNTER — Emergency Department (HOSPITAL_COMMUNITY)
Admission: EM | Admit: 2014-11-30 | Discharge: 2014-11-30 | Disposition: A | Discharge status: Home / Self Care | Payer: BC Managed Care – PPO | Attending: Emergency Medicine | Admitting: Emergency Medicine

## 2014-11-30 ENCOUNTER — Emergency Department (HOSPITAL_COMMUNITY): Payer: BC Managed Care – PPO

## 2014-11-30 DIAGNOSIS — Z791 Long term (current) use of non-steroidal anti-inflammatories (NSAID): Secondary | ICD-10-CM | POA: Insufficient documentation

## 2014-11-30 DIAGNOSIS — Z8659 Personal history of other mental and behavioral disorders: Secondary | ICD-10-CM | POA: Insufficient documentation

## 2014-11-30 DIAGNOSIS — Z8742 Personal history of other diseases of the female genital tract: Secondary | ICD-10-CM | POA: Insufficient documentation

## 2014-11-30 DIAGNOSIS — R102 Pelvic and perineal pain: Secondary | ICD-10-CM | POA: Insufficient documentation

## 2014-11-30 DIAGNOSIS — R05 Cough: Secondary | ICD-10-CM | POA: Diagnosis present

## 2014-11-30 DIAGNOSIS — Z3202 Encounter for pregnancy test, result negative: Secondary | ICD-10-CM | POA: Diagnosis not present

## 2014-11-30 DIAGNOSIS — R000 Tachycardia, unspecified: Secondary | ICD-10-CM | POA: Diagnosis not present

## 2014-11-30 DIAGNOSIS — R630 Anorexia: Secondary | ICD-10-CM | POA: Insufficient documentation

## 2014-11-30 DIAGNOSIS — J181 Lobar pneumonia, unspecified organism: Secondary | ICD-10-CM

## 2014-11-30 DIAGNOSIS — J189 Pneumonia, unspecified organism: Secondary | ICD-10-CM

## 2014-11-30 LAB — URINALYSIS, ROUTINE W REFLEX MICROSCOPIC
Bilirubin Urine: NEGATIVE
GLUCOSE, UA: NEGATIVE mg/dL
KETONES UR: NEGATIVE mg/dL
Nitrite: NEGATIVE
PROTEIN: NEGATIVE mg/dL
Specific Gravity, Urine: 1.022 (ref 1.005–1.030)
UROBILINOGEN UA: 2 mg/dL — AB (ref 0.0–1.0)
pH: 7.5 (ref 5.0–8.0)

## 2014-11-30 LAB — BASIC METABOLIC PANEL
ANION GAP: 8 (ref 5–15)
BUN: 6 mg/dL (ref 6–23)
CHLORIDE: 103 meq/L (ref 96–112)
CO2: 26 mmol/L (ref 19–32)
Calcium: 9.1 mg/dL (ref 8.4–10.5)
Creatinine, Ser: 0.65 mg/dL (ref 0.50–1.10)
Glucose, Bld: 112 mg/dL — ABNORMAL HIGH (ref 70–99)
Potassium: 3.4 mmol/L — ABNORMAL LOW (ref 3.5–5.1)
Sodium: 137 mmol/L (ref 135–145)

## 2014-11-30 LAB — URINE MICROSCOPIC-ADD ON

## 2014-11-30 LAB — CBC WITH DIFFERENTIAL/PLATELET
BASOS ABS: 0.1 10*3/uL (ref 0.0–0.1)
Basophils Relative: 1 % (ref 0–1)
EOS ABS: 0.1 10*3/uL (ref 0.0–0.7)
Eosinophils Relative: 1 % (ref 0–5)
HCT: 36.7 % (ref 36.0–46.0)
Hemoglobin: 12.7 g/dL (ref 12.0–15.0)
Lymphocytes Relative: 20 % (ref 12–46)
Lymphs Abs: 2.2 10*3/uL (ref 0.7–4.0)
MCH: 30.7 pg (ref 26.0–34.0)
MCHC: 34.6 g/dL (ref 30.0–36.0)
MCV: 88.6 fL (ref 78.0–100.0)
Monocytes Absolute: 1.1 10*3/uL — ABNORMAL HIGH (ref 0.1–1.0)
Monocytes Relative: 10 % (ref 3–12)
NEUTROS ABS: 7.6 10*3/uL (ref 1.7–7.7)
NEUTROS PCT: 68 % (ref 43–77)
Platelets: 333 10*3/uL (ref 150–400)
RBC: 4.14 MIL/uL (ref 3.87–5.11)
RDW: 12.6 % (ref 11.5–15.5)
WBC: 11 10*3/uL — ABNORMAL HIGH (ref 4.0–10.5)

## 2014-11-30 LAB — WET PREP, GENITAL
Clue Cells Wet Prep HPF POC: NONE SEEN
Trich, Wet Prep: NONE SEEN
WBC, Wet Prep HPF POC: NONE SEEN
Yeast Wet Prep HPF POC: NONE SEEN

## 2014-11-30 LAB — POC URINE PREG, ED: Preg Test, Ur: NEGATIVE

## 2014-11-30 MED ORDER — AZITHROMYCIN 250 MG PO TABS: 250.0000 mg | ORAL_TABLET | Freq: Every day | ORAL | Status: DC

## 2014-11-30 MED ORDER — CEFTRIAXONE SODIUM 1 G IJ SOLR
1.0000 g | Freq: Once | INTRAMUSCULAR | Status: AC
  Administered 2014-11-30: 1 g via INTRAMUSCULAR
  Filled 2014-11-30: qty 10

## 2014-11-30 MED ORDER — DM-GUAIFENESIN ER 30-600 MG PO TB12: 1.0000 | ORAL_TABLET | Freq: Two times a day (BID) | ORAL | Status: DC

## 2014-11-30 MED ORDER — ACETAMINOPHEN 500 MG PO TABS
1000.0000 mg | ORAL_TABLET | Freq: Once | ORAL | Status: AC
  Administered 2014-11-30: 1000 mg via ORAL
  Filled 2014-11-30: qty 2

## 2014-11-30 MED ORDER — NAPROXEN 500 MG PO TABS: 500.0000 mg | ORAL_TABLET | Freq: Two times a day (BID) | ORAL | Status: DC

## 2014-11-30 MED ORDER — IBUPROFEN 200 MG PO TABS
600.0000 mg | ORAL_TABLET | Freq: Once | ORAL | Status: AC
  Administered 2014-11-30: 600 mg via ORAL
  Filled 2014-11-30 (×2): qty 1

## 2014-11-30 MED ORDER — KETOROLAC TROMETHAMINE 30 MG/ML IJ SOLN
30.0000 mg | Freq: Once | INTRAMUSCULAR | Status: DC
  Filled 2014-11-30: qty 1

## 2014-11-30 NOTE — ED Provider Notes (Signed)
CSN: [id number]     Arrival date & time 11/30/14  1849 History   First MD Initiated Contact with Patient 11/30/14 1929     Chief Complaint  Patient presents with  . Cough  . Pelvic Pain     (Consider location/radiation/quality/duration/timing/severity/associated sxs/prior Treatment) HPI Pt is a 23yo female with hx of anxiety, BV, and pelvic pain, presenting to ED with c/o intermittent dry cough x3 days as well as pelvic pain.  Pt states her cough has been around "a little longer" but states symptoms worsened over the last 3 days.  Cough is dry, moderate in severity. Denies CP or SOB.  Pt also c/o pelvic pain that is aching and sore, 7/10 at worst, moderate relief with Ibuprofen. Denies dysuria or hematuria but does report mild vaginal discharge. Denies vaginal bleeding. LMP: 10/25/14. Denies hx of abdominal surgeries.  Pt was seen on 11/18/14 for pelvic pain, dx and tx for BV.    Past Medical History  Diagnosis Date  . Anxiety   . Vaginal discharge 10/21/2014  . BV (bacterial vaginosis) 10/21/2014  . Pelvic pain in female 11/18/2014   Past Surgical History  Procedure Laterality Date  . Tonsillectomy    . Wisdom tooth extraction    . Wisdom tooth extraction     Family History  Problem Relation Age of Onset  . Anesthesia problems Neg Hx   . Hypotension Neg Hx   . Malignant hyperthermia Neg Hx   . Pseudochol deficiency Neg Hx   . Heart attack Maternal Grandmother   . Other Paternal Grandfather     brain aneursym   History  Substance Use Topics  . Smoking status: Never Smoker   . Smokeless tobacco: Never Used  . Alcohol Use: No   OB History    Gravida Para Term Preterm AB TAB SAB Ectopic Multiple Living   1 1 1  0 0 0 0 0 0 1     Review of Systems  Constitutional: Positive for fever, chills and appetite change.  HENT: Negative for congestion.   Respiratory: Positive for cough. Negative for shortness of breath.   Cardiovascular: Negative for chest pain and palpitations.   Gastrointestinal: Positive for abdominal pain ( lower). Negative for nausea, vomiting, diarrhea and constipation.  Genitourinary: Positive for pelvic pain. Negative for dysuria, frequency, hematuria, flank pain and menstrual problem.  All other systems reviewed and are negative.     Allergies  Review of patient's allergies indicates no known allergies.  Home Medications   Prior to Admission medications   Medication Sig Start Date End Date Taking? Authorizing Provider  ibuprofen (ADVIL,MOTRIN) 600 MG tablet Take 1 tablet (600 mg total) by mouth every 6 (six) hours as needed. Patient taking differently: Take 600 mg by mouth every 6 (six) hours as needed for moderate pain.  06/14/14  Yes Raynelle FanningJulie Idol, PA-C  azithromycin (ZITHROMAX) 250 MG tablet Take 1 tablet (250 mg total) by mouth daily. Take first 2 tablets together, then 1 every day until finished. 11/30/14   Junius FinnerErin O'Malley, PA-C  dextromethorphan-guaiFENesin (MUCINEX DM) 30-600 MG per 12 hr tablet Take 1 tablet by mouth 2 (two) times daily. 11/30/14   Junius FinnerErin O'Malley, PA-C  naproxen (NAPROSYN) 500 MG tablet Take 1 tablet (500 mg total) by mouth 2 (two) times daily. 11/30/14   Junius FinnerErin O'Malley, PA-C   BP 100/51 mmHg  Pulse 91  Temp(Src) 99.1 F (37.3 C) (Oral)  Resp 18  Ht 5\' 7"  (1.702 m)  Wt 148 lb (67.132 kg)  BMI 23.17 kg/m2  SpO2 97%  LMP 10/25/2014 Physical Exam  Constitutional: She appears well-developed and well-nourished. No distress.  Pt lying in exam bed, appears well, non-toxic.  HENT:  Head: Normocephalic and atraumatic.  Eyes: Conjunctivae are normal. No scleral icterus.  Neck: Normal range of motion.  Cardiovascular: Regular rhythm and normal heart sounds.  Tachycardia present.   Pulmonary/Chest: Effort normal and breath sounds normal. No respiratory distress. She has no wheezes. She has no rales. She exhibits no tenderness.  Intermittent dry cough. No respiratory distress, able to speak in full sentences. Lungs: CTAB   Abdominal: Soft. Bowel sounds are normal. She exhibits no distension and no mass. There is no tenderness. There is no rebound and no guarding.  Soft, non-distended, non-tender.  Genitourinary:  Chaperoned exam.  Normal external genitalia. Vaginal discharge: scant- yellow white discharge in vaginal canal. No CMT, adnexal tenderness or masses  Musculoskeletal: Normal range of motion.  Neurological: She is alert.  Skin: Skin is warm and dry. She is not diaphoretic.  Nursing note and vitals reviewed.   ED Course  Procedures (including critical care time) Labs Review Labs Reviewed  CBC WITH DIFFERENTIAL - Abnormal; Notable for the following:    WBC 11.0 (*)    Monocytes Absolute 1.1 (*)    All other components within normal limits  BASIC METABOLIC PANEL - Abnormal; Notable for the following:    Potassium 3.4 (*)    Glucose, Bld 112 (*)    All other components within normal limits  URINALYSIS, ROUTINE W REFLEX MICROSCOPIC - Abnormal; Notable for the following:    APPearance HAZY (*)    Hgb urine dipstick SMALL (*)    Urobilinogen, UA 2.0 (*)    Leukocytes, UA SMALL (*)    All other components within normal limits  URINE MICROSCOPIC-ADD ON - Abnormal; Notable for the following:    Squamous Epithelial / LPF MANY (*)    Bacteria, UA FEW (*)    All other components within normal limits  WET PREP, GENITAL  GC/CHLAMYDIA PROBE AMP  HIV ANTIBODY (ROUTINE TESTING)  RPR  POC URINE PREG, ED    Imaging Review Dg Chest 2 View  11/30/2014   CLINICAL DATA:  Cough for 2-3 days.  EXAM: CHEST  2 VIEW  COMPARISON:  01/11/2012  FINDINGS: There is patchy airspace consolidation in the left midlung zone. There lung is clear. The cardiomediastinal contours are normal. There is no pleural effusion. No pneumothorax. Pulmonary vasculature is normal. The osseous structures are normal.  IMPRESSION: Left-sided pneumonia, likely in the upper lobe.   Electronically Signed   By: Rubye Oaks M.D.   On:  11/30/2014 20:46     EKG Interpretation None      MDM   Final diagnoses:  Left upper lobe pneumonia  Pelvic pain in female    Pt is a 23yo female c/o cough and pelvic pain. Pt was seen on 11/18/14 for pelvic pain, dx with BV.  Pt has mild tachycardia, temp of 100.2 but no respiratory distress. O2- 97-100% on RA.    Lungs: CTAB.  Pelvic exam: small amount of vaginal discharge, no CMT, adenexal tenderness or masses. Not concerned for ectopic pregnancy, ovarian torsion or TOA.  CXR: significant for left-sided pneumonia. Likely in upper lobe. Pt given IM rocephin in ED. Able to keep down PO fluids. HR improved while in ED, 91 at discharge. Pt hemodynamically stable for discharge home. Rx: azithromycin, mucinex DM, and naproxen. Advised to f/u with her PCP  in 3-4 days for recheck of symptoms. Return precautions provided. Pt verbalized understanding and agreement with tx plan.     Junius Finner, PA-C 11/30/14 2309  Tilden Fossa, MD 11/30/14 [phone]

## 2014-11-30 NOTE — ED Notes (Signed)
Patient transported to X-ray 

## 2014-11-30 NOTE — ED Notes (Signed)
2-3 days of pelvic pain; non productive cough. No problems with urination; normal bm.

## 2014-12-02 ENCOUNTER — Encounter: Payer: Self-pay | Admitting: Adult Health

## 2014-12-02 ENCOUNTER — Ambulatory Visit (INDEPENDENT_AMBULATORY_CARE_PROVIDER_SITE_OTHER): Payer: BLUE CROSS/BLUE SHIELD | Admitting: Adult Health

## 2014-12-02 ENCOUNTER — Other Ambulatory Visit: Payer: Self-pay | Admitting: Adult Health

## 2014-12-02 ENCOUNTER — Ambulatory Visit (INDEPENDENT_AMBULATORY_CARE_PROVIDER_SITE_OTHER): Payer: BLUE CROSS/BLUE SHIELD

## 2014-12-02 VITALS — BP 100/72 | Ht 67.0 in | Wt 146.5 lb

## 2014-12-02 DIAGNOSIS — R102 Pelvic and perineal pain: Secondary | ICD-10-CM

## 2014-12-02 LAB — GC/CHLAMYDIA PROBE AMP
CT Probe RNA: NEGATIVE
GC Probe RNA: NEGATIVE

## 2014-12-02 NOTE — Progress Notes (Signed)
Subjective:     Patient ID: Tiffany Goodwin, female   DOB: 04/10/1991, 24 y.o.   MRN: [id number]  HPI Tiffany Goodwin is a 24 year old white female in for US for pelvic pain.She was treated for pneumonia this week.  Review of Systems See HPI Reviewed past medical,surgical, social and family history. Reviewed medications and allergies.     Objective:   Physical Exam BP 100/72 mmHg  Ht 5\' 7"  (1.702 m)  Wt 146 lb 8 oz (66.452 kg)  BMI 22.94 kg/m2  LMP 01/13/2016reviewed US with pt.   Uterus 7.2 x 5.0 x 4.5 cm, anteverted  Endometrium 5.4 mm, symmetrical,   Right ovary 3.0 x 2.2 x 1.9 cm,   Left ovary 3.0 x 2.6 x 1.8 cm,   No free fluid or adnexal masses noted within the pelvis  Technician Comments:  Anteverted uterus, Endometrial cavity-5.374mm, bilateral adnexa/ovaries appear WNL,she is aware pain not gyn related could be bowel, next episode, see if any diarrhea or constipation.  Assessment:     Pelvic pain    Plan:     Follow up prn

## 2014-12-02 NOTE — Patient Instructions (Signed)
Follow up prn

## 2014-12-07 ENCOUNTER — Telehealth (HOSPITAL_COMMUNITY): Payer: Self-pay

## 2015-01-20 ENCOUNTER — Other Ambulatory Visit: Payer: Self-pay | Admitting: Adult Health

## 2015-01-20 ENCOUNTER — Ambulatory Visit (INDEPENDENT_AMBULATORY_CARE_PROVIDER_SITE_OTHER): Payer: BLUE CROSS/BLUE SHIELD | Admitting: Adult Health

## 2015-01-20 ENCOUNTER — Encounter: Payer: Self-pay | Admitting: Adult Health

## 2015-01-20 VITALS — BP 110/58 | HR 71 | Ht 67.0 in | Wt 145.0 lb

## 2015-01-20 DIAGNOSIS — N898 Other specified noninflammatory disorders of vagina: Secondary | ICD-10-CM

## 2015-01-20 DIAGNOSIS — Z113 Encounter for screening for infections with a predominantly sexual mode of transmission: Secondary | ICD-10-CM

## 2015-01-20 HISTORY — DX: Encounter for screening for infections with a predominantly sexual mode of transmission: Z11.3

## 2015-01-20 LAB — POCT WET PREP (WET MOUNT): WBC, Wet Prep HPF POC: POSITIVE

## 2015-01-20 NOTE — Patient Instructions (Signed)
Will talk when labs back  Follow prn

## 2015-01-20 NOTE — Progress Notes (Signed)
Subjective:     Patient ID: Tiffany Goodwin, female   DOB: 08/21/1991, 24 y.o.   MRN: [id number]  HPI Tiffany Goodwin is a 24 year old white female in complaining of vaginal discharge for about a month.It is clear, no odor or itch.And she requests STD screening.She is not on birth control, has 24 year old and says she is Ok if gets pregnant.  Review of Systems +vaginal discharge, all other systems negative  Reviewed past medical,surgical, social and family history. Reviewed medications and allergies.     Objective:   Physical Exam BP 110/58 mmHg  Pulse 71  Ht 5\' 7"  (1.702 m)  Wt 145 lb (65.772 kg)  BMI 22.71 kg/m2  LMP 01/03/2015 Skin warm and dry.Pelvic: external genitalia is normal in appearance no lesions, vagina: clear discharge without odor,urethra has no lesions or masses noted, cervix:smooth and bulbous, uterus: normal size, shape and contour, non tender, no masses felt, adnexa: no masses or tenderness noted. Bladder is non tender and no masses felt. Wet prep: +WBCs. GC/CHL obtained.    Discussed could be ovulatory mucous.  Assessment:     Vaginal discharge STD screening    Plan:    Check HIV,RPR,HSV 2 and GC/CHL, will talk when results back Follow up prn

## 2015-01-21 LAB — HSV 2 ANTIBODY, IGG: HSV 2 Glycoprotein G Ab, IgG: <0.91 index (ref 0.00–0.90)

## 2015-01-21 LAB — RPR: RPR: NONREACTIVE

## 2015-01-21 LAB — HIV ANTIBODY (ROUTINE TESTING W REFLEX): HIV Screen 4th Generation wRfx: NONREACTIVE

## 2015-01-22 LAB — GC/CHLAMYDIA PROBE AMP
CHLAMYDIA, DNA PROBE: NEGATIVE
NEISSERIA GONORRHOEAE BY PCR: NEGATIVE

## 2015-03-16 ENCOUNTER — Ambulatory Visit (INDEPENDENT_AMBULATORY_CARE_PROVIDER_SITE_OTHER): Payer: BLUE CROSS/BLUE SHIELD | Admitting: Adult Health

## 2015-03-16 ENCOUNTER — Ambulatory Visit: Payer: BLUE CROSS/BLUE SHIELD | Admitting: Adult Health

## 2015-03-16 ENCOUNTER — Encounter: Payer: Self-pay | Admitting: Adult Health

## 2015-03-16 VITALS — BP 104/68 | HR 76 | Ht 67.0 in | Wt 140.5 lb

## 2015-03-16 DIAGNOSIS — N898 Other specified noninflammatory disorders of vagina: Secondary | ICD-10-CM

## 2015-03-16 DIAGNOSIS — Z113 Encounter for screening for infections with a predominantly sexual mode of transmission: Secondary | ICD-10-CM | POA: Diagnosis not present

## 2015-03-16 DIAGNOSIS — B9689 Other specified bacterial agents as the cause of diseases classified elsewhere: Secondary | ICD-10-CM

## 2015-03-16 DIAGNOSIS — A499 Bacterial infection, unspecified: Secondary | ICD-10-CM

## 2015-03-16 DIAGNOSIS — N760 Acute vaginitis: Secondary | ICD-10-CM

## 2015-03-16 LAB — POCT WET PREP (WET MOUNT): WBC WET PREP: POSITIVE

## 2015-03-16 MED ORDER — METRONIDAZOLE 500 MG PO TABS: 500.0000 mg | ORAL_TABLET | Freq: Two times a day (BID) | ORAL | Status: DC

## 2015-03-16 NOTE — Patient Instructions (Signed)
Bacterial Vaginosis Bacterial vaginosis is a vaginal infection that occurs when the normal balance of bacteria in the vagina is disrupted. It results from an overgrowth of certain bacteria. This is the most common vaginal infection in women of childbearing age. Treatment is important to prevent complications, especially in pregnant women, as it can cause a premature delivery. CAUSES  Bacterial vaginosis is caused by an increase in harmful bacteria that are normally present in smaller amounts in the vagina. Several different kinds of bacteria can cause bacterial vaginosis. However, the reason that the condition develops is not fully understood. RISK FACTORS Certain activities or behaviors can put you at an increased risk of developing bacterial vaginosis, including:  Having a new sex partner or multiple sex partners.  Douching.  Using an intrauterine device (IUD) for contraception. Women do not get bacterial vaginosis from toilet seats, bedding, swimming pools, or contact with objects around them. SIGNS AND SYMPTOMS  Some women with bacterial vaginosis have no signs or symptoms. Common symptoms include:  Grey vaginal discharge.  A fishlike odor with discharge, especially after sexual intercourse.  Itching or burning of the vagina and vulva.  Burning or pain with urination. DIAGNOSIS  Your health care provider will take a medical history and examine the vagina for signs of bacterial vaginosis. A sample of vaginal fluid may be taken. Your health care provider will look at this sample under a microscope to check for bacteria and abnormal cells. A vaginal pH test may also be done.  TREATMENT  Bacterial vaginosis may be treated with antibiotic medicines. These may be given in the form of a pill or a vaginal cream. A second round of antibiotics may be prescribed if the condition comes back after treatment.  HOME CARE INSTRUCTIONS   Only take over-the-counter or prescription medicines as  directed by your health care provider.  If antibiotic medicine was prescribed, take it as directed. Make sure you finish it even if you start to feel better.  Do not have sex until treatment is completed.  Tell all sexual partners that you have a vaginal infection. They should see their health care provider and be treated if they have problems, such as a mild rash or itching.  Practice safe sex by using condoms and only having one sex partner. SEEK MEDICAL CARE IF:   Your symptoms are not improving after 3 days of treatment.  You have increased discharge or pain.  You have a fever. MAKE SURE YOU:   Understand these instructions.  Will watch your condition.  Will get help right away if you are not doing well or get worse. FOR MORE INFORMATION  Centers for Disease Control and Prevention, Division of STD Prevention: SolutionApps.co.zawww.cdc.gov/std American Sexual Health Association (ASHA): www.ashastd.org  Document Released: 11/05/2005 Document Revised: 08/26/2013 Document Reviewed: 06/17/2013 [facility] Patient Information 2015 [place], [place]. This information is not intended to replace advice given to you by your health care provider. Make sure you discuss any questions you have with your health care provider. Take flagyl No alcohol Use condoms

## 2015-03-16 NOTE — Progress Notes (Signed)
Subjective:     Patient ID: Tiffany Goodwin, female   DOB: 04/28/1991, 24 y.o.   MRN: [id number]  HPI Tiffany Goodwin is a 24 year old white female in complaining of vaginal discharge with itch and odor for 1-2 weeks, does have new partner.Requests GC/CHL.  Review of Systems +vaginal discharge with itch and odor, all other systems negative  Reviewed past medical,surgical, social and family history. Reviewed medications and allergies.     Objective:   Physical Exam BP 104/68 mmHg  Pulse 76  Ht 5\' 7"  (1.702 m)  Wt 140 lb 8 oz (63.73 kg)  BMI 22.00 kg/m2  LMP 03/08/2015  Skin warm and dry.Pelvic: external genitalia is normal in appearance no lesions, vagina: white discharge with odor,urethra has no lesions or masses noted, cervix:smooth and bulbous, uterus: normal size, shape and contour, non tender, no masses felt, adnexa: no masses or tenderness noted. Bladder is non tender and no masses felt. Wet prep: + for clue cells and +WBCs. GC/CHL obtained.     Assessment:     Vaginal discharge BV STD screening    Plan:    GC/CHL sent Rx flagyl 500 mg 1 bid x 7 days, no alcohol, review handout on BV   Use condoms Follow up prn

## 2015-03-17 LAB — GC/CHLAMYDIA PROBE AMP
CHLAMYDIA, DNA PROBE: NEGATIVE
Neisseria gonorrhoeae by PCR: NEGATIVE

## 2015-04-08 IMAGING — DX DG CHEST 2V
2 series · 2 of 2 positions shown · non-contrast
Comparison: 01/11/2012

CLINICAL DATA: Cough for 2-3 days.

EXAM:
CHEST  2 VIEW

[chest pa]
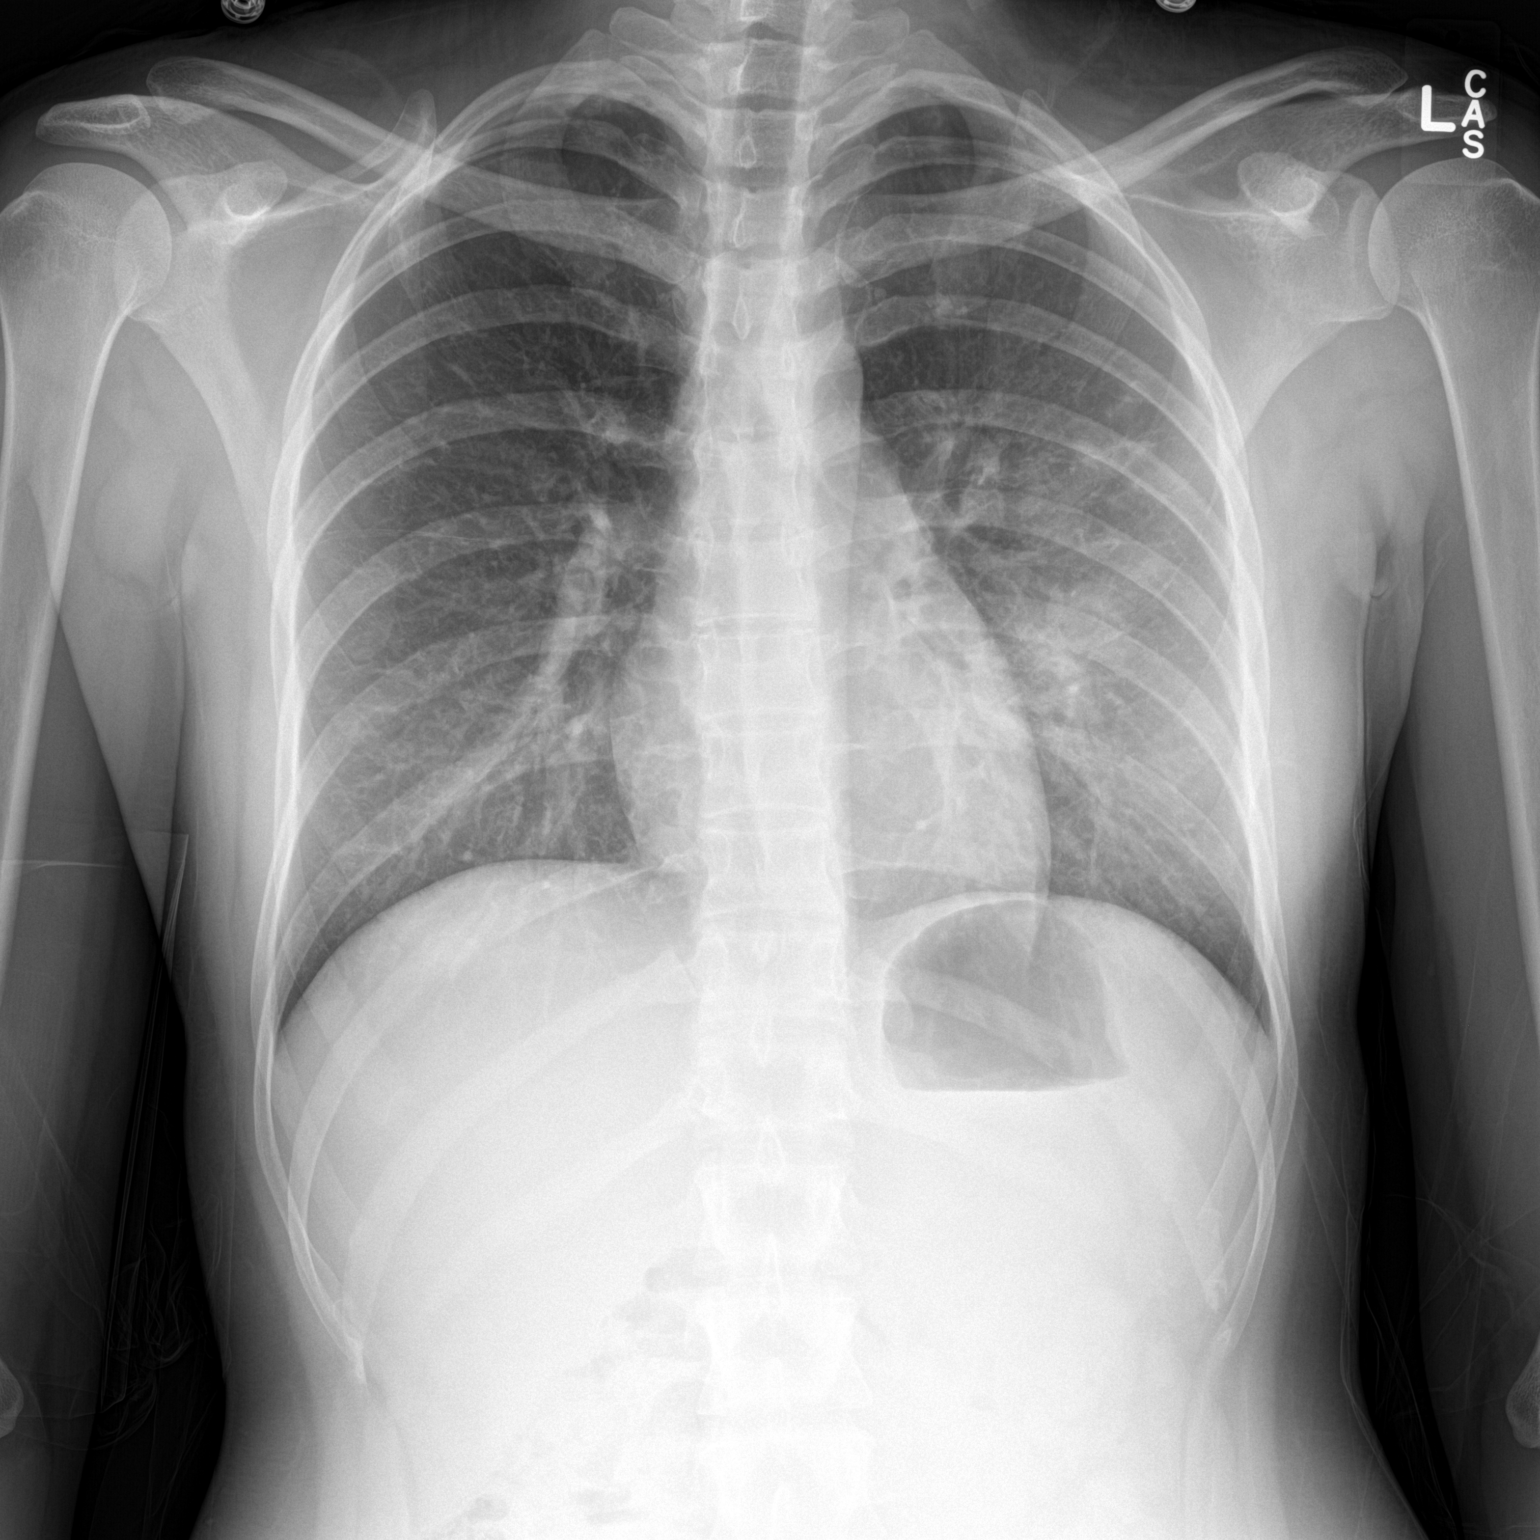

[chest lat]
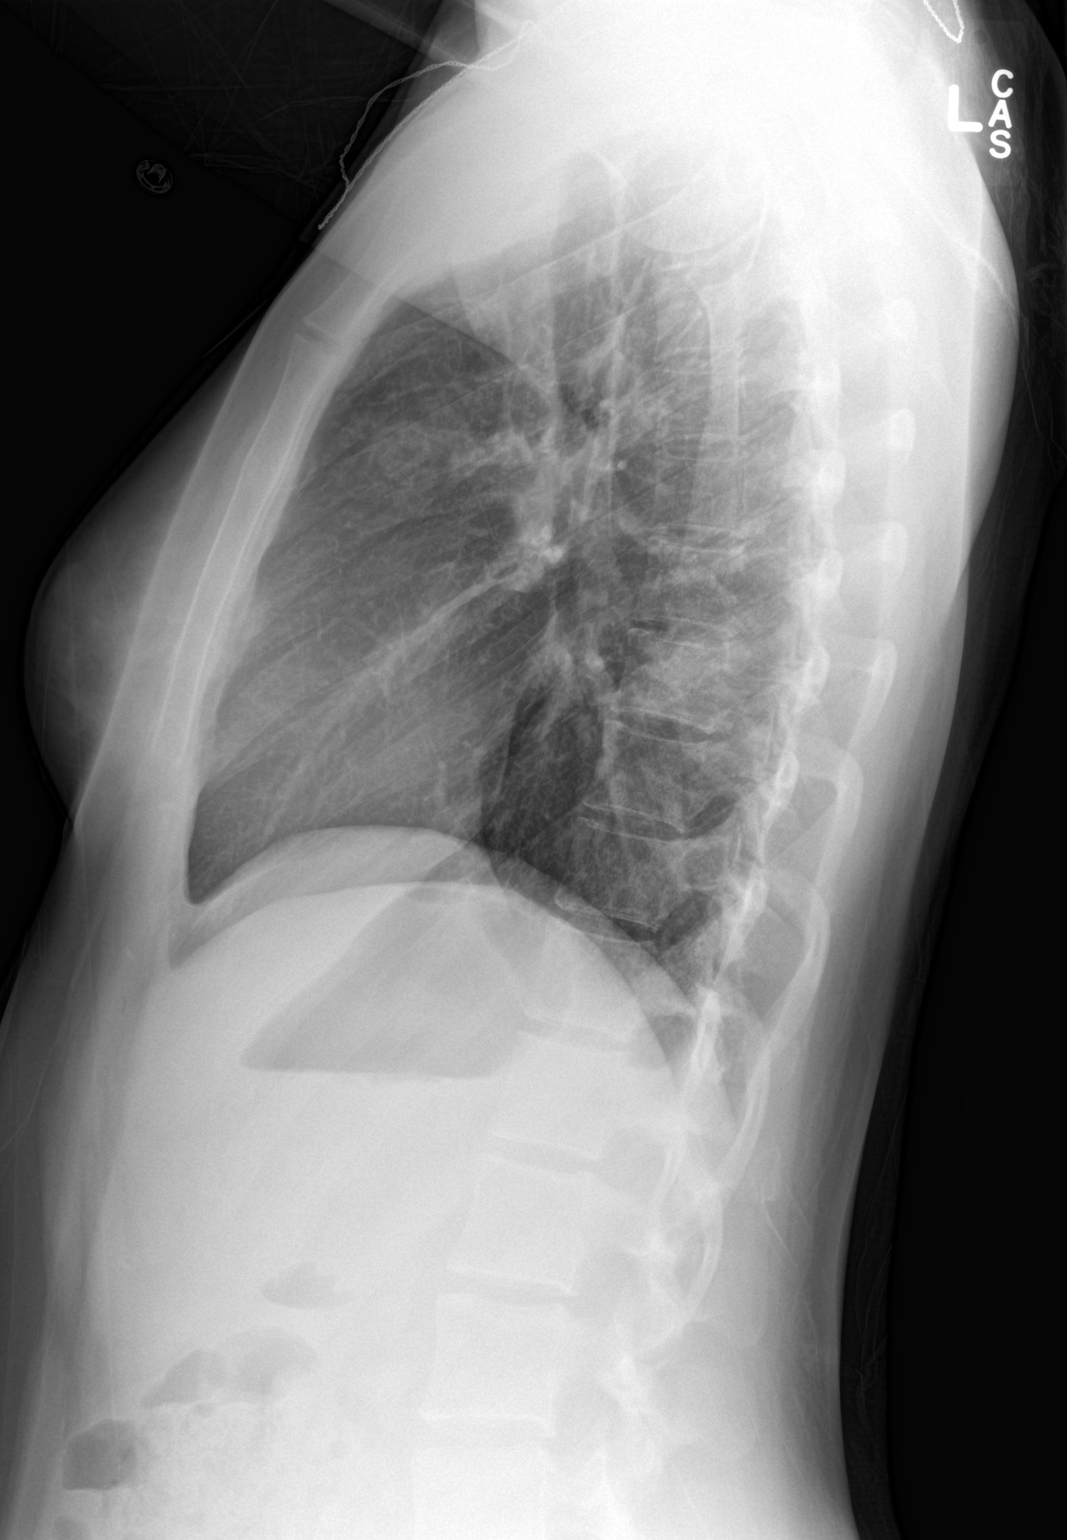

[2 of 2 positions shown; findings below may reference images not displayed]

FINDINGS: There is patchy airspace consolidation in the left midlung zone.
There lung is clear. The cardiomediastinal contours are normal.
There is no pleural effusion. No pneumothorax. Pulmonary vasculature
is normal. The osseous structures are normal.
IMPRESSION: Left-sided pneumonia, likely in the upper lobe.

## 2015-04-13 ENCOUNTER — Emergency Department (HOSPITAL_COMMUNITY)
Admission: EM | Admit: 2015-04-13 | Discharge: 2015-04-13 | Discharge status: Against Medical Advice | Payer: BLUE CROSS/BLUE SHIELD | Attending: Emergency Medicine | Admitting: Emergency Medicine

## 2015-04-13 ENCOUNTER — Encounter (HOSPITAL_COMMUNITY): Payer: Self-pay | Admitting: Emergency Medicine

## 2015-04-13 DIAGNOSIS — N898 Other specified noninflammatory disorders of vagina: Secondary | ICD-10-CM | POA: Insufficient documentation

## 2015-04-13 DIAGNOSIS — Z3202 Encounter for pregnancy test, result negative: Secondary | ICD-10-CM | POA: Insufficient documentation

## 2015-04-13 LAB — URINALYSIS, ROUTINE W REFLEX MICROSCOPIC
Bilirubin Urine: NEGATIVE
Glucose, UA: NEGATIVE mg/dL
HGB URINE DIPSTICK: NEGATIVE
KETONES UR: NEGATIVE mg/dL
Leukocytes, UA: NEGATIVE
NITRITE: NEGATIVE
Protein, ur: NEGATIVE mg/dL
Specific Gravity, Urine: 1.026 (ref 1.005–1.030)
UROBILINOGEN UA: 1 mg/dL (ref 0.0–1.0)
pH: 7 (ref 5.0–8.0)

## 2015-04-13 LAB — POC URINE PREG, ED: PREG TEST UR: NEGATIVE

## 2015-04-13 NOTE — ED Notes (Signed)
Called x 1 with no answer in lobby for reassessment

## 2015-04-13 NOTE — ED Notes (Signed)
Pt sts lower abd pain and LMP was 03/09/15; pt sts clear vaginal discharge

## 2015-04-13 NOTE — ED Notes (Signed)
Pts name called for a room no answer 

## 2015-04-19 ENCOUNTER — Encounter: Payer: Self-pay | Admitting: Adult Health

## 2015-04-19 ENCOUNTER — Ambulatory Visit (INDEPENDENT_AMBULATORY_CARE_PROVIDER_SITE_OTHER): Payer: Self-pay | Admitting: Adult Health

## 2015-04-19 VITALS — BP 100/60 | HR 60 | Ht 67.0 in | Wt 141.0 lb

## 2015-04-19 DIAGNOSIS — R1032 Left lower quadrant pain: Secondary | ICD-10-CM | POA: Insufficient documentation

## 2015-04-19 DIAGNOSIS — N926 Irregular menstruation, unspecified: Secondary | ICD-10-CM

## 2015-04-19 DIAGNOSIS — Z3202 Encounter for pregnancy test, result negative: Secondary | ICD-10-CM

## 2015-04-19 HISTORY — DX: Left lower quadrant pain: R10.32

## 2015-04-19 HISTORY — DX: Irregular menstruation, unspecified: N92.6

## 2015-04-19 LAB — POCT URINE PREGNANCY: Preg Test, Ur: NEGATIVE

## 2015-04-19 NOTE — Patient Instructions (Signed)
Pelvic Pain Female pelvic pain can be caused by many different things and start from a variety of places. Pelvic pain refers to pain that is located in the lower half of the abdomen and between your hips. The pain may occur over a short period of time (acute) or may be reoccurring (chronic). The cause of pelvic pain may be related to disorders affecting the female reproductive organs (gynecologic), but it may also be related to the bladder, kidney stones, an intestinal complication, or muscle or skeletal problems. Getting help right away for pelvic pain is important, especially if there has been severe, sharp, or a sudden onset of unusual pain. It is also important to get help right away because some types of pelvic pain can be life threatening.  CAUSES  Below are only some of the causes of pelvic pain. The causes of pelvic pain can be in one of several categories.   Gynecologic.  Pelvic inflammatory disease.  Sexually transmitted infection.  Ovarian cyst or a twisted ovarian ligament (ovarian torsion).  Uterine lining that grows outside the uterus (endometriosis).  Fibroids, cysts, or tumors.  Ovulation.  Pregnancy.  Pregnancy that occurs outside the uterus (ectopic pregnancy).  Miscarriage.  Labor.  Abruption of the placenta or ruptured uterus.  Infection.  Uterine infection (endometritis).  Bladder infection.  Diverticulitis.  Miscarriage related to a uterine infection (septic abortion).  Bladder.  Inflammation of the bladder (cystitis).  Kidney stone(s).  Gastrointestinal.  Constipation.  Diverticulitis.  Neurologic.  Trauma.  Feeling pelvic pain because of mental or emotional causes (psychosomatic).  Cancers of the bowel or pelvis. EVALUATION  Your caregiver will want to take a careful history of your concerns. This includes recent changes in your health, a careful gynecologic history of your periods (menses), and a sexual history. Obtaining your family  history and medical history is also important. Your caregiver may suggest a pelvic exam. A pelvic exam will help identify the location and severity of the pain. It also helps in the evaluation of which organ system may be involved. In order to identify the cause of the pelvic pain and be properly treated, your caregiver may order tests. These tests may include:   A pregnancy test.  Pelvic ultrasonography.  An X-ray exam of the abdomen.  A urinalysis or evaluation of vaginal discharge.  Blood tests. HOME CARE INSTRUCTIONS   Only take over-the-counter or prescription medicines for pain, discomfort, or fever as directed by your caregiver.   Rest as directed by your caregiver.   Eat a balanced diet.   Drink enough fluids to make your urine clear or pale yellow, or as directed.   Avoid sexual intercourse if it causes pain.   Apply warm or cold compresses to the lower abdomen depending on which one helps the pain.   Avoid stressful situations.   Keep a journal of your pelvic pain. Write down when it started, where the pain is located, and if there are things that seem to be associated with the pain, such as food or your menstrual cycle.  Follow up with your caregiver as directed.  SEEK MEDICAL CARE IF:  Your medicine does not help your pain.  You have abnormal vaginal discharge. SEEK IMMEDIATE MEDICAL CARE IF:   You have heavy bleeding from the vagina.   Your pelvic pain increases.   You feel light-headed or faint.   You have chills.   You have pain with urination or blood in your urine.   You have uncontrolled diarrhea   or vomiting.   You have a fever or persistent symptoms for more than 3 days.  You have a fever and your symptoms suddenly get worse.   You are being physically or sexually abused.  MAKE SURE YOU:  Understand these instructions.  Will watch your condition.  Will get help if you are not doing well or get worse. Document Released:  10/02/2004 Document Revised: 03/22/2014 Document Reviewed: 02/25/2012 [facility] CenterExitCare Patient Information 2015 [place]ExitCare, [place]LLC. This information is not intended to replace advice given to you by your health care provider. Make sure you discuss any questions you have with your health care provider. Return 6/3 for [place]

## 2015-04-19 NOTE — Progress Notes (Signed)
Subjective:     Patient ID: Tiffany Goodwin, female   DOB: 01/28/1991, 24 y.o.   MRN: [id number]  HPI  Tiffany Goodwin is a 24 year old white female, in complaining of having missed a period and having pelvic pain esp on left.[facility], she was treated here in April for BV.She had negative pregnancy test in ER.  Review of Systems Patient denies any headaches, hearing loss, fatigue, blurred vision, shortness of breath, chest pain,  problems with bowel movements, urination, or intercourse. No joint pain or mood swings. See HPI for positives.  Reviewed past medical,surgical, social and family history. Reviewed medications and allergies.     Objective:   Physical Exam BP 100/60 mmHg  Pulse 60  Ht 5\' 7"  (1.702 m)  Wt 141 lb (63.957 kg)  BMI 22.08 kg/m2 UPT negative. Skin warm and dry.Pelvic: external genitalia is normal in appearance no lesions, vagina: white discharge without odor,urethra has no lesions or masses noted, cervix:smooth and bulbous, uterus: normal size, shape and contour, non tender, no masses felt, adnexa: no masses, LLQ tenderness noted. Bladder is non tender and no masses felt.    Assessment:     LLQ pain Missed period    Plan:     Return 6/3 for gyn US to assess ovary Finish flagyl Review handout on pelvic pain

## 2015-04-22 ENCOUNTER — Ambulatory Visit (INDEPENDENT_AMBULATORY_CARE_PROVIDER_SITE_OTHER): Payer: Self-pay

## 2015-04-22 DIAGNOSIS — R1032 Left lower quadrant pain: Secondary | ICD-10-CM

## 2015-04-22 NOTE — Progress Notes (Signed)
US pelvis,normal anteverted uterus,normal ov's bilat,no free fluid,no pain during ultrasound

## 2015-05-24 ENCOUNTER — Emergency Department (HOSPITAL_COMMUNITY)
Admission: EM | Admit: 2015-05-24 | Discharge: 2015-05-24 | Disposition: A | Discharge status: Home / Self Care | Payer: BLUE CROSS/BLUE SHIELD | Attending: Emergency Medicine | Admitting: Emergency Medicine

## 2015-05-24 ENCOUNTER — Encounter (HOSPITAL_COMMUNITY): Payer: Self-pay

## 2015-05-24 DIAGNOSIS — Z8701 Personal history of pneumonia (recurrent): Secondary | ICD-10-CM | POA: Insufficient documentation

## 2015-05-24 DIAGNOSIS — F419 Anxiety disorder, unspecified: Secondary | ICD-10-CM | POA: Insufficient documentation

## 2015-05-24 DIAGNOSIS — N760 Acute vaginitis: Secondary | ICD-10-CM

## 2015-05-24 LAB — URINALYSIS, ROUTINE W REFLEX MICROSCOPIC
Bilirubin Urine: NEGATIVE
Glucose, UA: NEGATIVE mg/dL
Hgb urine dipstick: NEGATIVE
KETONES UR: NEGATIVE mg/dL
LEUKOCYTES UA: NEGATIVE
NITRITE: NEGATIVE
Protein, ur: NEGATIVE mg/dL
SPECIFIC GRAVITY, URINE: 1.02 (ref 1.005–1.030)
Urobilinogen, UA: 0.2 mg/dL (ref 0.0–1.0)
pH: 7.5 (ref 5.0–8.0)

## 2015-05-24 LAB — WET PREP, GENITAL
Clue Cells Wet Prep HPF POC: NONE SEEN
Trich, Wet Prep: NONE SEEN
Yeast Wet Prep HPF POC: NONE SEEN

## 2015-05-24 MED ORDER — TRAMADOL HCL 50 MG PO TABS
50.0000 mg | ORAL_TABLET | Freq: Four times a day (QID) | ORAL | Status: DC | PRN
Start: 2015-05-24 — End: 2015-07-18

## 2015-05-24 MED ORDER — METRONIDAZOLE 500 MG PO TABS: 500.0000 mg | ORAL_TABLET | Freq: Two times a day (BID) | ORAL | Status: DC

## 2015-05-24 NOTE — ED Notes (Signed)
Patient with no complaints at this time. Respirations even and unlabored. Skin warm/dry. Discharge instructions reviewed with patient at this time. Patient given opportunity to voice concerns/ask questions. Patient discharged at this time and left Emergency Department with steady gait.   

## 2015-05-24 NOTE — ED Notes (Signed)
Pregnancy test was completely at about 8:00pm, it was negative. :)

## 2015-05-24 NOTE — ED Notes (Signed)
Patient states "i think i have bacterial vaginosis again" also c/o pelvic pain to left side.

## 2015-05-24 NOTE — Discharge Instructions (Signed)
Follow up with dr. Ferguson in one week. °

## 2015-05-26 LAB — GC/CHLAMYDIA PROBE AMP (~~LOC~~) NOT AT ARMC
CHLAMYDIA, DNA PROBE: NEGATIVE
Neisseria Gonorrhea: NEGATIVE

## 2015-05-26 NOTE — ED Provider Notes (Signed)
CSN: [id number]     Arrival date & time 05/24/15  1927 History   First MD Initiated Contact with Patient 05/24/15 1952     Chief Complaint  Patient presents with  . Pelvic Pain     (Consider location/radiation/quality/duration/timing/severity/associated sxs/prior Treatment) Patient is a 24 y.o. female presenting with vaginal discharge. The history is provided by the patient (the pt complains of a vaginal discharge.  she thinks she has vagininitis again).  Vaginal Discharge Quality:  Watery Severity:  Mild Onset quality:  Gradual Timing:  Intermittent Progression:  Waxing and waning Chronicity:  Recurrent Context: not after urination   Associated symptoms: no abdominal pain     Past Medical History  Diagnosis Date  . Anxiety   . Vaginal discharge 10/21/2014  . BV (bacterial vaginosis) 10/21/2014  . Pelvic pain in female 11/18/2014  . Pneumonia 2016,january    left side  . Screening for STD (sexually transmitted disease) 01/20/2015  . LLQ pain 04/19/2015  . Missed period 04/19/2015   Past Surgical History  Procedure Laterality Date  . Tonsillectomy    . Wisdom tooth extraction    . Wisdom tooth extraction     Family History  Problem Relation Age of Onset  . Anesthesia problems Neg Hx   . Hypotension Neg Hx   . Malignant hyperthermia Neg Hx   . Pseudochol deficiency Neg Hx   . Heart attack Maternal Grandmother   . Other Paternal Grandfather     brain aneursym  . Cancer Maternal Grandfather     bone,chest,liver,brain,pancreatic,throat   History  Substance Use Topics  . Smoking status: Never Smoker   . Smokeless tobacco: Never Used  . Alcohol Use: No   OB History    Gravida Para Term Preterm AB TAB SAB Ectopic Multiple Living   0 0 0 0 0 0 1     Review of Systems  Constitutional: Negative for appetite change and fatigue.  HENT: Negative for congestion, ear discharge and sinus pressure.   Eyes: Negative for discharge.  Respiratory: Negative for cough.    Cardiovascular: Negative for chest pain.  Gastrointestinal: Negative for abdominal pain and diarrhea.  Genitourinary: Positive for vaginal discharge. Negative for frequency and hematuria.  Musculoskeletal: Negative for back pain.  Skin: Negative for rash.  Neurological: Negative for seizures and headaches.  Psychiatric/Behavioral: Negative for hallucinations.      Allergies  Review of patient's allergies indicates no known allergies.  Home Medications   Prior to Admission medications   Medication Sig Start Date End Date Taking? Authorizing Provider  metroNIDAZOLE (FLAGYL) 500 MG tablet Take 1 tablet (500 mg total) by mouth 2 (two) times daily. One po bid x 7 days 05/24/15   Bethann Berkshire, MD  traMADol (ULTRAM) 50 MG tablet Take 1 tablet (50 mg total) by mouth every 6 (six) hours as needed. 05/24/15   Bethann Berkshire, MD   BP 115/74 mmHg  Pulse 74  Temp(Src) 98.7 F (37.1 C) (Oral)  Resp 17  Ht  (1.702 m)  Wt 140 lb (63.504 kg)  BMI 21.92 kg/m2  SpO2 99%  LMP 03/09/2015 Physical Exam  Constitutional: She is oriented to person, place, and time. She appears well-developed.  HENT:  Head: Normocephalic.  Eyes: Conjunctivae and EOM are normal. No scleral icterus.  Neck: Neck supple. No thyromegaly present.  Cardiovascular: Normal rate and regular rhythm.  Exam reveals no gallop and no friction rub.   No murmur heard. Pulmonary/Chest: No stridor. She has no  wheezes. She has no rales. She exhibits no tenderness.  Abdominal: She exhibits no distension. There is no tenderness. There is no rebound.  Genitourinary:  Minimal left adenexal tender with minor discharge  Musculoskeletal: Normal range of motion. She exhibits no edema.  Lymphadenopathy:    She has no cervical adenopathy.  Neurological: She is oriented to person, place, and time. She exhibits normal muscle tone. Coordination normal.  Skin: No rash noted. No erythema.  Psychiatric: She has a normal mood and affect. Her  behavior is normal.    ED Course  Procedures (including critical care time) Labs Review Labs Reviewed  WET PREP, GENITAL - Abnormal; Notable for the following:    WBC, Wet Prep HPF POC MODERATE (*)    All other components within normal limits  URINALYSIS, ROUTINE W REFLEX MICROSCOPIC (NOT AT [facility])  POC URINE PREG, ED  GC/CHLAMYDIA PROBE AMP ([place]) NOT AT [facility]    Imaging Review No results found.   EKG Interpretation None      MDM   Final diagnoses:  Vaginitis    Recurrent vaginitis,  Will tx with flagyl and gyn referral     Bethann BerkshireJoseph Paetyn Pietrzak, MD 05/26/15 1438

## 2015-07-05 ENCOUNTER — Emergency Department (HOSPITAL_COMMUNITY)
Admission: EM | Admit: 2015-07-05 | Discharge: 2015-07-05 | Disposition: A | Discharge status: Home / Self Care | Payer: BLUE CROSS/BLUE SHIELD | Attending: Emergency Medicine | Admitting: Emergency Medicine

## 2015-07-05 ENCOUNTER — Encounter (HOSPITAL_COMMUNITY): Payer: Self-pay | Admitting: Emergency Medicine

## 2015-07-05 DIAGNOSIS — R102 Pelvic and perineal pain: Secondary | ICD-10-CM | POA: Insufficient documentation

## 2015-07-05 DIAGNOSIS — Z8701 Personal history of pneumonia (recurrent): Secondary | ICD-10-CM | POA: Insufficient documentation

## 2015-07-05 DIAGNOSIS — N760 Acute vaginitis: Secondary | ICD-10-CM | POA: Insufficient documentation

## 2015-07-05 DIAGNOSIS — Z3202 Encounter for pregnancy test, result negative: Secondary | ICD-10-CM | POA: Insufficient documentation

## 2015-07-05 DIAGNOSIS — B9689 Other specified bacterial agents as the cause of diseases classified elsewhere: Secondary | ICD-10-CM

## 2015-07-05 DIAGNOSIS — F419 Anxiety disorder, unspecified: Secondary | ICD-10-CM | POA: Insufficient documentation

## 2015-07-05 LAB — URINALYSIS, ROUTINE W REFLEX MICROSCOPIC
Bilirubin Urine: NEGATIVE
Glucose, UA: NEGATIVE mg/dL
Hgb urine dipstick: NEGATIVE
KETONES UR: NEGATIVE mg/dL
Leukocytes, UA: NEGATIVE
NITRITE: NEGATIVE
Protein, ur: NEGATIVE mg/dL
Specific Gravity, Urine: 1.02 (ref 1.005–1.030)
UROBILINOGEN UA: 0.2 mg/dL (ref 0.0–1.0)
pH: 7.5 (ref 5.0–8.0)

## 2015-07-05 LAB — WET PREP, GENITAL
TRICH WET PREP: NONE SEEN
YEAST WET PREP: NONE SEEN

## 2015-07-05 LAB — PREGNANCY, URINE: PREG TEST UR: NEGATIVE

## 2015-07-05 MED ORDER — IBUPROFEN 800 MG PO TABS: 800.0000 mg | ORAL_TABLET | Freq: Three times a day (TID) | ORAL | Status: DC

## 2015-07-05 MED ORDER — METRONIDAZOLE 500 MG PO TABS: 500.0000 mg | ORAL_TABLET | Freq: Two times a day (BID) | ORAL | Status: DC

## 2015-07-05 MED ORDER — HYDROCODONE-ACETAMINOPHEN 5-325 MG PO TABS: 2.0000 | ORAL_TABLET | ORAL | Status: DC | PRN

## 2015-07-05 NOTE — Discharge Instructions (Signed)
Bacterial Vaginosis °Bacterial vaginosis is a vaginal infection that occurs when the normal balance of bacteria in the vagina is disrupted. It results from an overgrowth of certain bacteria. This is the most common vaginal infection in women of childbearing age. Treatment is important to prevent complications, especially in pregnant women, as it can cause a premature delivery. °CAUSES  °Bacterial vaginosis is caused by an increase in harmful bacteria that are normally present in smaller amounts in the vagina. Several different kinds of bacteria can cause bacterial vaginosis. However, the reason that the condition develops is not fully understood. °RISK FACTORS °Certain activities or behaviors can put you at an increased risk of developing bacterial vaginosis, including: °· Having a new sex partner or multiple sex partners. °· Douching. °· Using an intrauterine device (IUD) for contraception. °Women do not get bacterial vaginosis from toilet seats, bedding, swimming pools, or contact with objects around them. °SIGNS AND SYMPTOMS  °Some women with bacterial vaginosis have no signs or symptoms. Common symptoms include: °· Grey vaginal discharge. °· A fishlike odor with discharge, especially after sexual intercourse. °· Itching or burning of the vagina and vulva. °· Burning or pain with urination. °DIAGNOSIS  °Your health care provider will take a medical history and examine the vagina for signs of bacterial vaginosis. A sample of vaginal fluid may be taken. Your health care provider will look at this sample under a microscope to check for bacteria and abnormal cells. A vaginal pH test may also be done.  °TREATMENT  °Bacterial vaginosis may be treated with antibiotic medicines. These may be given in the form of a pill or a vaginal cream. A second round of antibiotics may be prescribed if the condition comes back after treatment.  °HOME CARE INSTRUCTIONS  °· Only take over-the-counter or prescription medicines as  directed by your health care provider. °· If antibiotic medicine was prescribed, take it as directed. Make sure you finish it even if you start to feel better. °· Do not have sex until treatment is completed. °· Tell all sexual partners that you have a vaginal infection. They should see their health care provider and be treated if they have problems, such as a mild rash or itching. °· Practice safe sex by using condoms and only having one sex partner. °SEEK MEDICAL CARE IF:  °· Your symptoms are not improving after 3 days of treatment. °· You have increased discharge or pain. °· You have a fever. °MAKE SURE YOU:  °· Understand these instructions. °· Will watch your condition. °· Will get help right away if you are not doing well or get worse. °FOR MORE INFORMATION  °Centers for Disease Control and Prevention, Division of STD Prevention: www.cdc.gov/std °American Sexual Health Association (ASHA): www.ashastd.org  °Document Released: 11/05/2005 Document Revised: 08/26/2013 Document Reviewed: 06/17/2013 °ExitCare® Patient Information ©2015 [employer]. This information is not intended to replace advice given to you by your health care provider. Make sure you discuss any questions you have with your health care provider. ° °Pelvic Pain °Female pelvic pain can be caused by many different things and start from a variety of places. Pelvic pain refers to pain that is located in the lower half of the abdomen and between your hips. The pain may occur over a short period of time (acute) or may be reoccurring (chronic). The cause of pelvic pain may be related to disorders affecting the female reproductive organs (gynecologic), but it may also be related to the bladder, kidney stones, an intestinal complication,   or muscle or skeletal problems. Getting help right away for pelvic pain is important, especially if there has been severe, sharp, or a sudden onset of unusual pain. It is also important to get help right away because  some types of pelvic pain can be life threatening.  °CAUSES  °Below are only some of the causes of pelvic pain. The causes of pelvic pain can be in one of several categories.  °· Gynecologic. °¨ Pelvic inflammatory disease. °¨ Sexually transmitted infection. °¨ Ovarian cyst or a twisted ovarian ligament (ovarian torsion). °¨ Uterine lining that grows outside the uterus (endometriosis). °¨ Fibroids, cysts, or tumors. °¨ Ovulation. °· Pregnancy. °¨ Pregnancy that occurs outside the uterus (ectopic pregnancy). °¨ Miscarriage. °¨ Labor. °¨ Abruption of the placenta or ruptured uterus. °· Infection. °¨ Uterine infection (endometritis). °¨ Bladder infection. °¨ Diverticulitis. °¨ Miscarriage related to a uterine infection (septic abortion). °· Bladder. °¨ Inflammation of the bladder (cystitis). °¨ Kidney stone(s). °· Gastrointestinal. °¨ Constipation. °¨ Diverticulitis. °· Neurologic. °¨ Trauma. °¨ Feeling pelvic pain because of mental or emotional causes (psychosomatic). °· Cancers of the bowel or pelvis. °EVALUATION  °Your caregiver will want to take a careful history of your concerns. This includes recent changes in your health, a careful gynecologic history of your periods (menses), and a sexual history. Obtaining your family history and medical history is also important. Your caregiver may suggest a pelvic exam. A pelvic exam will help identify the location and severity of the pain. It also helps in the evaluation of which organ system may be involved. In order to identify the cause of the pelvic pain and be properly treated, your caregiver may order tests. These tests may include:  °· A pregnancy test. °· Pelvic ultrasonography. °· An X-ray exam of the abdomen. °· A urinalysis or evaluation of vaginal discharge. °· Blood tests. °HOME CARE INSTRUCTIONS  °· Only take over-the-counter or prescription medicines for pain, discomfort, or fever as directed by your caregiver.   °· Rest as directed by your caregiver.    °· Eat a balanced diet.   °· Drink enough fluids to make your urine clear or pale yellow, or as directed.   °· Avoid sexual intercourse if it causes pain.   °· Apply warm or cold compresses to the lower abdomen depending on which one helps the pain.   °· Avoid stressful situations.   °· Keep a journal of your pelvic pain. Write down when it started, where the pain is located, and if there are things that seem to be associated with the pain, such as food or your menstrual cycle. °· Follow up with your caregiver as directed.   °SEEK MEDICAL CARE IF: °· Your medicine does not help your pain. °· You have abnormal vaginal discharge. °SEEK IMMEDIATE MEDICAL CARE IF:  °· You have heavy bleeding from the vagina.   °· Your pelvic pain increases.   °· You feel light-headed or faint.   °· You have chills.   °· You have pain with urination or blood in your urine.   °· You have uncontrolled diarrhea or vomiting.   °· You have a fever or persistent symptoms for more than 3 days. °· You have a fever and your symptoms suddenly get worse.   °· You are being physically or sexually abused.   °MAKE SURE YOU: °· Understand these instructions. °· Will watch your condition. °· Will get help if you are not doing well or get worse. °Document Released: 10/02/2004 Document Revised: 03/22/2014 Document Reviewed: 02/25/2012 °ExitCare® Patient Information ©2015 [employer]. This information is not intended to   replace advice given to you by your health care provider. Make sure you discuss any questions you have with your health care provider. ° °

## 2015-07-05 NOTE — ED Notes (Signed)
Having pelvic pain for last 3 months, rates pain 7/10 pain greater on left.  Pt says last menstrual cycle was May.

## 2015-07-05 NOTE — ED Notes (Signed)
Gave drink to family member in the room

## 2015-07-05 NOTE — ED Provider Notes (Signed)
CSN: [id number]     Arrival date & time 07/05/15  1847 History   First MD Initiated Contact with Patient 07/05/15 1855     Chief Complaint  Patient presents with  . Pelvic Pain     (Consider location/radiation/quality/duration/timing/severity/associated sxs/prior Treatment) HPI Comments: Patient presents to the emergency department for evaluation of pelvic pain. Patient has been expressing pain on the left side of her low pelvic region for several months. She has been diagnosed with an ovarian cyst in the past. Patient has seen OB/GYN for these problems in the past and the cyst has been watched. Patient reports that she has not had a menstrual cycle for the last 3 or 4 months. Pain is worsening. She also has developed vaginal irritation and discharge similar to when she has had bacterial vaginosis in the past.  Patient is a 24 y.o. female presenting with pelvic pain.  Pelvic Pain    Past Medical History  Diagnosis Date  . Anxiety   . Vaginal discharge 10/21/2014  . BV (bacterial vaginosis) 10/21/2014  . Pelvic pain in female 11/18/2014  . Pneumonia 2016,january    left side  . Screening for STD (sexually transmitted disease) 01/20/2015  . LLQ pain 04/19/2015  . Missed period 04/19/2015   Past Surgical History  Procedure Laterality Date  . Tonsillectomy    . Wisdom tooth extraction    . Wisdom tooth extraction     Family History  Problem Relation Age of Onset  . Anesthesia problems Neg Hx   . Hypotension Neg Hx   . Malignant hyperthermia Neg Hx   . Pseudochol deficiency Neg Hx   . Heart attack Maternal Grandmother   . Other Paternal Grandfather     brain aneursym  . Cancer Maternal Grandfather     bone,chest,liver,brain,pancreatic,throat   Social History  Substance Use Topics  . Smoking status: Never Smoker   . Smokeless tobacco: Never Used  . Alcohol Use: No   OB History    Gravida Para Term Preterm AB TAB SAB Ectopic Multiple Living   1 1 1  0 0 0 0 0 0 1      Review of Systems  Genitourinary: Positive for vaginal discharge and pelvic pain.  All other systems reviewed and are negative.     Allergies  Review of patient's allergies indicates no known allergies.  Home Medications   Prior to Admission medications   Medication Sig Start Date End Date Taking? Authorizing Provider  HYDROcodone-acetaminophen (NORCO/VICODIN) 5-325 MG per tablet Take 2 tablets by mouth every 4 (four) hours as needed for moderate pain. 07/05/15   Gilda Crease, MD  ibuprofen (ADVIL,MOTRIN) 800 MG tablet Take 1 tablet (800 mg total) by mouth 3 (three) times daily. 07/05/15   Gilda Crease, MD  metroNIDAZOLE (FLAGYL) 500 MG tablet Take 1 tablet (500 mg total) by mouth 2 (two) times daily. One po bid x 7 days 07/05/15   Gilda Crease, MD  traMADol (ULTRAM) 50 MG tablet Take 1 tablet (50 mg total) by mouth every 6 (six) hours as needed. Patient not taking: Reported on 07/05/2015 05/24/15   Bethann Berkshire, MD   BP 132/82 mmHg  Pulse 76  Temp(Src) 98.3 F (36.8 C) (Oral)  Resp 16  Ht 5\' 7"  (1.702 m)  Wt 150 lb (68.04 kg)  BMI 23.49 kg/m2  SpO2 99%  LMP 03/20/2015 Physical Exam  Constitutional: She is oriented to person, place, and time. She appears well-developed and well-nourished. No distress.  HENT:  Head: Normocephalic and atraumatic.  Right Ear: Hearing normal.  Left Ear: Hearing normal.  Nose: Nose normal.  Mouth/Throat: Oropharynx is clear and moist and mucous membranes are normal.  Eyes: Conjunctivae and EOM are normal. Pupils are equal, round, and reactive to light.  Neck: Normal range of motion. Neck supple.  Cardiovascular: Regular rhythm, S1 normal and S2 normal.  Exam reveals no gallop and no friction rub.   No murmur heard. Pulmonary/Chest: Effort normal and breath sounds normal. No respiratory distress. She exhibits no tenderness.  Abdominal: Soft. Normal appearance and bowel sounds are normal. There is no hepatosplenomegaly.  There is no tenderness. There is no rebound, no guarding, no tenderness at McBurney's point and negative Murphy's sign. No hernia.  Musculoskeletal: Normal range of motion.  Neurological: She is alert and oriented to person, place, and time. She has normal strength. No cranial nerve deficit or sensory deficit. Coordination normal. GCS eye subscore is 4. GCS verbal subscore is 5. GCS motor subscore is 6.  Skin: Skin is warm, dry and intact. No rash noted. No cyanosis.  Psychiatric: She has a normal mood and affect. Her speech is normal and behavior is normal. Thought content normal.  Nursing note and vitals reviewed.   ED Course  Procedures (including critical care time) Labs Review Labs Reviewed  WET PREP, GENITAL - Abnormal; Notable for the following:    Clue Cells Wet Prep HPF POC MANY (*)    WBC, Wet Prep HPF POC FEW (*)    All other components within normal limits  URINALYSIS, ROUTINE W REFLEX MICROSCOPIC (NOT AT [facility]) - Abnormal; Notable for the following:    APPearance HAZY (*)    All other components within normal limits  PREGNANCY, URINE  GC/CHLAMYDIA PROBE AMP ([place]) NOT AT [facility]    Imaging Review No results found. I have personally reviewed and evaluated these images and lab results as part of my medical decision-making.   EKG Interpretation None      MDM   Final diagnoses:  Pelvic pain in female  BV (bacterial vaginosis)    Presents to the ER for evaluation of pelvic pain and vaginal irritation. Workup consistent with bacterial vaginosis, no other acute abnormalities noted.    Gilda Crease, MD 07/06/15 [phone]

## 2015-07-06 LAB — GC/CHLAMYDIA PROBE AMP (~~LOC~~) NOT AT ARMC
Chlamydia: NEGATIVE
Neisseria Gonorrhea: NEGATIVE

## 2015-07-18 ENCOUNTER — Ambulatory Visit (INDEPENDENT_AMBULATORY_CARE_PROVIDER_SITE_OTHER): Payer: Self-pay | Admitting: Adult Health

## 2015-07-18 ENCOUNTER — Encounter: Payer: Self-pay | Admitting: Adult Health

## 2015-07-18 VITALS — BP 104/70 | HR 68 | Ht 67.0 in | Wt 141.5 lb

## 2015-07-18 DIAGNOSIS — R1032 Left lower quadrant pain: Secondary | ICD-10-CM

## 2015-07-18 DIAGNOSIS — N926 Irregular menstruation, unspecified: Secondary | ICD-10-CM

## 2015-07-18 DIAGNOSIS — Z3202 Encounter for pregnancy test, result negative: Secondary | ICD-10-CM

## 2015-07-18 LAB — POCT URINE PREGNANCY: PREG TEST UR: NEGATIVE

## 2015-07-18 MED ORDER — MEDROXYPROGESTERONE ACETATE 10 MG PO TABS: 10.0000 mg | ORAL_TABLET | Freq: Every day | ORAL | Status: DC

## 2015-07-18 NOTE — Progress Notes (Signed)
Subjective:     Patient ID: Tiffany Goodwin, female   DOB: May 15, 1991, 24 y.o.   MRN: [id number]  HPI [place]yer is a 24 year old white female in complaining of LLQ pain and no period for about 5 months now, she wants to get on birth control to regulate her cycle.She was seen 8/16 for these same complaints and was treated for BV, GC/CHL was negative.   Review of Systems Patient denies any headaches, hearing loss, fatigue, blurred vision, shortness of breath, chest pain,  problems with bowel movements, urination, or intercourse. No joint pain or mood swings.She  HPI for positives. Reviewed past medical,surgical, social and family history. Reviewed medications and allergies.     Objective:   Physical Exam BP 104/70 mmHg  Pulse 68  Ht  (1.702 m)  Wt 141 lb 8 oz (64.184 kg)  BMI 22.16 kg/m2  LMP 03/20/2015 UPT negative, Skin warm and dry.Pelvic: external genitalia is normal in appearance no lesions, vagina: white discharge without odor,urethra has no lesions or masses noted, cervix:smooth and bulbous, uterus: normal size, shape and contour, non tender, no masses felt, adnexa: no masses LLQ tenderness noted. Bladder is non tender and no masses felt. She had normal pelvic US in [place].     Assessment:    Missed periods LLQ pain     Plan:     Rx provera 10 mg take 1 daily x 10 days Call when bleeding starts, will start OCs after that, will give sample, of lo loestrin, her insurance not in effect yet. Follow up prn

## 2015-07-18 NOTE — Patient Instructions (Signed)
Take provera daily for 10 days Call me when you start bleeding Will start birth control

## 2015-08-01 ENCOUNTER — Telehealth: Payer: Self-pay | Admitting: Adult Health

## 2015-08-01 NOTE — Telephone Encounter (Signed)
Left message, can get get lo loestrin to start now to regulate cycle

## 2015-08-01 NOTE — Telephone Encounter (Signed)
Spoke with pt. Period started yesterday. JSY

## 2015-09-06 ENCOUNTER — Encounter: Payer: Self-pay | Admitting: Adult Health

## 2015-09-06 ENCOUNTER — Ambulatory Visit (INDEPENDENT_AMBULATORY_CARE_PROVIDER_SITE_OTHER): Payer: 59 | Admitting: Adult Health

## 2015-09-06 VITALS — BP 120/62 | HR 60 | Ht 67.0 in | Wt 144.0 lb

## 2015-09-06 DIAGNOSIS — N926 Irregular menstruation, unspecified: Secondary | ICD-10-CM

## 2015-09-06 DIAGNOSIS — Z3202 Encounter for pregnancy test, result negative: Secondary | ICD-10-CM

## 2015-09-06 DIAGNOSIS — Z319 Encounter for procreative management, unspecified: Secondary | ICD-10-CM

## 2015-09-06 LAB — POCT URINE PREGNANCY: Preg Test, Ur: NEGATIVE

## 2015-09-06 MED ORDER — NORETHIN-ETH ESTRAD-FE BIPHAS 1 MG-10 MCG / 10 MCG PO TABS: 1.0000 | ORAL_TABLET | Freq: Every day | ORAL | Status: DC

## 2015-09-06 NOTE — Progress Notes (Signed)
Subjective:     Patient ID: Tiffany Goodwin, female   DOB: 07/24/1991, 24 y.o.   MRN: [id number]  HPI Tiffany Goodwin is a 24 year old white female in for UPT has missed a period.  Review of Systems Missed period,Patient denies any headaches, hearing loss, fatigue, blurred vision, shortness of breath, chest pain, abdominal pain, problems with bowel movements, urination, or intercourse. No joint pain or mood swings. Reviewed past medical,surgical, social and family history. Reviewed medications and allergies.     Objective:   Physical Exam BP 120/62 mmHg  Pulse 60  Ht 5\' 7"  (1.702 m)  Wt 144 lb (65.318 kg)  BMI 22.55 kg/m2  LMP 07/31/2015 UPT negative, Skin warm and dry. Lungs: clear to ausculation bilaterally. Cardiovascular: regular rate and rhythm.Discussed trying lo loestrin to see if period returns and is regular, since she did not start OCs after 9/11 period that  Was started with provera.    Assessment:     Missed period Desires pregnancy     Plan:    Rx lo loestrin take 1 daily dispp 1 pack with 11 refills, start today 1 pack given lot [id number] A,exp 6/17 Follow up in 3 months Call me with period Take OTC prenatal vitamin

## 2015-09-06 NOTE — Patient Instructions (Signed)
Start lo loestrin today Take prenatal vitamins Call me with period Follow up in 3 months

## 2015-09-15 ENCOUNTER — Emergency Department (HOSPITAL_COMMUNITY)
Admission: EM | Admit: 2015-09-15 | Discharge: 2015-09-15 | Disposition: A | Discharge status: Home / Self Care | Payer: 59 | Attending: Emergency Medicine | Admitting: Emergency Medicine

## 2015-09-15 ENCOUNTER — Encounter (HOSPITAL_COMMUNITY): Payer: Self-pay | Admitting: Emergency Medicine

## 2015-09-15 DIAGNOSIS — Z8742 Personal history of other diseases of the female genital tract: Secondary | ICD-10-CM | POA: Insufficient documentation

## 2015-09-15 DIAGNOSIS — Z793 Long term (current) use of hormonal contraceptives: Secondary | ICD-10-CM | POA: Diagnosis not present

## 2015-09-15 DIAGNOSIS — Z8619 Personal history of other infectious and parasitic diseases: Secondary | ICD-10-CM | POA: Diagnosis not present

## 2015-09-15 DIAGNOSIS — J069 Acute upper respiratory infection, unspecified: Secondary | ICD-10-CM | POA: Insufficient documentation

## 2015-09-15 DIAGNOSIS — Z8701 Personal history of pneumonia (recurrent): Secondary | ICD-10-CM | POA: Diagnosis not present

## 2015-09-15 DIAGNOSIS — F419 Anxiety disorder, unspecified: Secondary | ICD-10-CM | POA: Diagnosis not present

## 2015-09-15 DIAGNOSIS — Z791 Long term (current) use of non-steroidal anti-inflammatories (NSAID): Secondary | ICD-10-CM | POA: Diagnosis not present

## 2015-09-15 DIAGNOSIS — J029 Acute pharyngitis, unspecified: Secondary | ICD-10-CM | POA: Diagnosis present

## 2015-09-15 MED ORDER — LORATADINE-PSEUDOEPHEDRINE ER 5-120 MG PO TB12: 1.0000 | ORAL_TABLET | Freq: Two times a day (BID) | ORAL | Status: DC

## 2015-09-15 MED ORDER — IBUPROFEN 600 MG PO TABS: 600.0000 mg | ORAL_TABLET | Freq: Four times a day (QID) | ORAL | Status: DC | PRN

## 2015-09-15 MED ORDER — IBUPROFEN 800 MG PO TABS
800.0000 mg | ORAL_TABLET | Freq: Once | ORAL | Status: AC
  Administered 2015-09-15: 800 mg via ORAL
  Filled 2015-09-15: qty 1

## 2015-09-15 NOTE — ED Provider Notes (Signed)
CSN: [id number]     Arrival date & time 09/15/15  1630 History   First MD Initiated Contact with Patient 09/15/15 1652     Chief Complaint  Patient presents with  . Sore Throat     (Consider location/radiation/quality/duration/timing/severity/associated sxs/prior Treatment) Patient is a 24 y.o. female presenting with pharyngitis. The history is provided by the patient.  Sore Throat This is a new problem. The current episode started yesterday. The problem occurs intermittently. The problem has been gradually worsening. Associated symptoms include chills, congestion, myalgias and a sore throat. Nothing aggravates the symptoms. She has tried NSAIDs for the symptoms. The treatment provided no relief.    Past Medical History  Diagnosis Date  . Anxiety   . Vaginal discharge 10/21/2014  . BV (bacterial vaginosis) 10/21/2014  . Pelvic pain in female 11/18/2014  . Pneumonia 2016,january    left side  . Screening for STD (sexually transmitted disease) 01/20/2015  . LLQ pain 04/19/2015  . Missed period 04/19/2015   Past Surgical History  Procedure Laterality Date  . Tonsillectomy    . Wisdom tooth extraction    . Wisdom tooth extraction     Family History  Problem Relation Age of Onset  . Anesthesia problems Neg Hx   . Hypotension Neg Hx   . Malignant hyperthermia Neg Hx   . Pseudochol deficiency Neg Hx   . Heart attack Maternal Grandmother   . Other Paternal Grandfather     brain aneursym  . Cancer Maternal Grandfather     bone,chest,liver,brain,pancreatic,throat   Social History  Substance Use Topics  . Smoking status: Never Smoker   . Smokeless tobacco: Never Used  . Alcohol Use: No   OB History    Gravida Para Term Preterm AB TAB SAB Ectopic Multiple Living   0 0 0 0 0 0 1     Review of Systems  Constitutional: Positive for chills.  HENT: Positive for congestion and sore throat.   Musculoskeletal: Positive for myalgias.  Psychiatric/Behavioral: The patient is  nervous/anxious.   All other systems reviewed and are negative.     Allergies  Review of patient's allergies indicates no known allergies.  Home Medications   Prior to Admission medications   Medication Sig Start Date End Date Taking? Authorizing Provider  HYDROcodone-acetaminophen (NORCO/VICODIN) 5-325 MG per tablet Take 2 tablets by mouth every 4 (four) hours as needed for moderate pain. 07/05/15   Gilda Crease, MD  ibuprofen (ADVIL,MOTRIN) 800 MG tablet Take 1 tablet (800 mg total) by mouth 3 (three) times daily. 07/05/15   Gilda Crease, MD  Norethindrone-Ethinyl Estradiol-Fe Biphas (LO LOESTRIN FE) 1 MG-10 MCG / 10 MCG tablet Take 1 tablet by mouth daily. Take 1 daily by mouth 09/06/15   Adline Potter, NP   BP 123/67 mmHg  Pulse 93  Temp(Src) 97.7 F (36.5 C) (Oral)  Resp 16  Ht  (1.702 m)  Wt 145 lb (65.772 kg)  BMI 22.71 kg/m2  SpO2 100%  LMP 07/31/2015 Physical Exam  Constitutional: She is oriented to person, place, and time. She appears well-developed and well-nourished.  Non-toxic appearance.  HENT:  Head: Normocephalic.  Right Ear: Tympanic membrane and external ear normal.  Left Ear: Tympanic membrane and external ear normal.  Nasal congestion present.  There is mild increased redness of the posterior pharynx. The uvula is in the midline. The airway is patent.  Eyes: EOM and lids are normal. Pupils are equal, round, and reactive to  light.  Neck: Normal range of motion. Neck supple. Carotid bruit is not present.  Cardiovascular: Normal rate, regular rhythm, normal heart sounds, intact distal pulses and normal pulses.   Pulmonary/Chest: Breath sounds normal. No respiratory distress.  Abdominal: Soft. Bowel sounds are normal. There is no tenderness. There is no guarding.  Musculoskeletal: Normal range of motion. She exhibits no edema or tenderness.  Lymphadenopathy:       Head (right side): No submandibular adenopathy present.       Head  (left side): No submandibular adenopathy present.    She has no cervical adenopathy.  Neurological: She is alert and oriented to person, place, and time. She has normal strength. No cranial nerve deficit or sensory deficit. She exhibits normal muscle tone. Coordination normal.  Gait is steady.  Skin: Skin is warm and dry.  Psychiatric: She has a normal mood and affect. Her speech is normal.  Nursing note and vitals reviewed.   ED Course  Procedures (including critical care time) Labs Review Labs Reviewed - No data to display  Imaging Review No results found. I have personally reviewed and evaluated these images and lab results as part of my medical decision-making.   EKG Interpretation None      MDM  Vital signs are well within normal limits. The pulse oximetry is 100% on room air. The examination favors upper respiratory infection. The patient is given a prescription for ibuprofen and Claritin-D. She is asked to increase fluids, wash hands frequently. Mask is provided for protection and for the protections of those around her.    Final diagnoses:  None    **I have reviewed nursing notes, vital signs, and all appropriate lab and imaging results for this patient.Tiffany Goodwin*    Tiffany Colvard, PA-C 09/15/15 1708  Linwood DibblesJon Knapp, MD 09/15/15 [phone]

## 2015-09-15 NOTE — Discharge Instructions (Signed)
Please wash hands frequently. Please use a mask until symptoms have resolved. Claritin-D 2 times daily, ibuprofen every 6 hours with food. Please increase fluids. Upper Respiratory Infection, Adult Most upper respiratory infections (URIs) are caused by a virus. A URI affects the nose, throat, and upper air passages. The most common type of URI is often called "the common cold." HOME CARE   Take medicines only as told by your doctor.  Gargle warm saltwater or take cough drops to comfort your throat as told by your doctor.  Use a warm mist humidifier or inhale steam from a shower to increase air moisture. This may make it easier to breathe.  Drink enough fluid to keep your pee (urine) clear or pale yellow.  Eat soups and other clear broths.  Have a healthy diet.  Rest as needed.  Go back to work when your fever is gone or your doctor says it is okay.  You may need to stay home longer to avoid giving your URI to others.  You can also wear a face mask and wash your hands often to prevent spread of the virus.  Use your inhaler more if you have asthma.  Do not use any tobacco products, including cigarettes, chewing tobacco, or electronic cigarettes. If you need help quitting, ask your doctor. GET HELP IF:  You are getting worse, not better.  Your symptoms are not helped by medicine.  You have chills.  You are getting more short of breath.  You have brown or red mucus.  You have yellow or brown discharge from your nose.  You have pain in your face, especially when you bend forward.  You have a fever.  You have puffy (swollen) neck glands.  You have pain while swallowing.  You have white areas in the back of your throat. GET HELP RIGHT AWAY IF:   You have very bad or constant:  Headache.  Ear pain.  Pain in your forehead, behind your eyes, and over your cheekbones (sinus pain).  Chest pain.  You have long-lasting (chronic) lung disease and any of the  following:  Wheezing.  Long-lasting cough.  Coughing up blood.  A change in your usual mucus.  You have a stiff neck.  You have changes in your:  Vision.  Hearing.  Thinking.  Mood. MAKE SURE YOU:   Understand these instructions.  Will watch your condition.  Will get help right away if you are not doing well or get worse.   This information is not intended to replace advice given to you by your health care provider. Make sure you discuss any questions you have with your health care provider.   Document Released: 04/23/2008 Document Revised: 03/22/2015 Document Reviewed: 02/10/2014 Elsevier Interactive Patient Education [employer]2016 Elsevier Inc.

## 2015-09-15 NOTE — ED Notes (Signed)
Pt c/o of sore throat, congestion, and general malaise since yesterday. Denies n/v/d.

## 2015-09-28 ENCOUNTER — Telehealth: Payer: Self-pay | Admitting: Adult Health

## 2015-09-28 NOTE — Telephone Encounter (Signed)
Left message x 1. JSY 

## 2015-09-28 NOTE — Telephone Encounter (Signed)
No answer @ 2:37 pm. JSY

## 2015-09-29 ENCOUNTER — Telehealth: Payer: Self-pay | Admitting: Adult Health

## 2015-09-29 NOTE — Telephone Encounter (Signed)
She has started her period,Keep taking lo loestrin til appt in January will talk then about stopping and trying to get pregnant

## 2015-09-29 NOTE — Telephone Encounter (Signed)
Spoke with pt. Pt started period on 09/27/15. Note routed to JAG. JSY

## 2015-09-29 NOTE — Telephone Encounter (Signed)
Spoke with pt. Pt started period on 09/27/15, really light. Also, does she need to continue birth control? If she needs to continue, needs more birth control. Please advise. Thanks!! JSY

## 2015-11-21 ENCOUNTER — Emergency Department (HOSPITAL_COMMUNITY)
Admission: EM | Admit: 2015-11-21 | Discharge: 2015-11-22 | Disposition: A | Discharge status: Home / Self Care | Payer: 59 | Attending: Emergency Medicine | Admitting: Emergency Medicine

## 2015-11-21 ENCOUNTER — Encounter (HOSPITAL_COMMUNITY): Payer: Self-pay | Admitting: Emergency Medicine

## 2015-11-21 DIAGNOSIS — Z3202 Encounter for pregnancy test, result negative: Secondary | ICD-10-CM | POA: Diagnosis not present

## 2015-11-21 DIAGNOSIS — B9689 Other specified bacterial agents as the cause of diseases classified elsewhere: Secondary | ICD-10-CM

## 2015-11-21 DIAGNOSIS — N898 Other specified noninflammatory disorders of vagina: Secondary | ICD-10-CM | POA: Diagnosis present

## 2015-11-21 DIAGNOSIS — Z8659 Personal history of other mental and behavioral disorders: Secondary | ICD-10-CM | POA: Insufficient documentation

## 2015-11-21 DIAGNOSIS — Z79899 Other long term (current) drug therapy: Secondary | ICD-10-CM | POA: Insufficient documentation

## 2015-11-21 DIAGNOSIS — Z793 Long term (current) use of hormonal contraceptives: Secondary | ICD-10-CM | POA: Diagnosis not present

## 2015-11-21 DIAGNOSIS — N760 Acute vaginitis: Secondary | ICD-10-CM | POA: Diagnosis not present

## 2015-11-21 DIAGNOSIS — Z8701 Personal history of pneumonia (recurrent): Secondary | ICD-10-CM | POA: Insufficient documentation

## 2015-11-21 LAB — WET PREP, GENITAL
Sperm: NONE SEEN
Trich, Wet Prep: NONE SEEN
Yeast Wet Prep HPF POC: NONE SEEN

## 2015-11-21 NOTE — ED Notes (Addendum)
Patient complaining of vaginal discharge and odor x 1 week. States she has history of Bacterial Vaginosis and "I think I have BV again."

## 2015-11-22 LAB — URINALYSIS, ROUTINE W REFLEX MICROSCOPIC
Bilirubin Urine: NEGATIVE
GLUCOSE, UA: NEGATIVE mg/dL
Hgb urine dipstick: NEGATIVE
KETONES UR: NEGATIVE mg/dL
Leukocytes, UA: NEGATIVE
Nitrite: NEGATIVE
PROTEIN: NEGATIVE mg/dL
Specific Gravity, Urine: 1.025 (ref 1.005–1.030)
pH: 7 (ref 5.0–8.0)

## 2015-11-22 LAB — POC URINE PREG, ED: Preg Test, Ur: NEGATIVE

## 2015-11-22 MED ORDER — METRONIDAZOLE 500 MG PO TABS
500.0000 mg | ORAL_TABLET | Freq: Once | ORAL | Status: AC
  Administered 2015-11-22: 500 mg via ORAL
  Filled 2015-11-22: qty 1

## 2015-11-22 MED ORDER — METRONIDAZOLE 500 MG PO TABS: 500.0000 mg | ORAL_TABLET | Freq: Two times a day (BID) | ORAL | Status: DC

## 2015-11-22 NOTE — Discharge Instructions (Signed)

## 2015-11-22 NOTE — ED Provider Notes (Signed)
CSN: [id number]     Arrival date & time 11/21/15  2114 History   First MD Initiated Contact with Patient 11/21/15 2331     Chief Complaint  Patient presents with  . Vaginal Discharge     (Consider location/radiation/quality/duration/timing/severity/associated sxs/prior Treatment) HPI  Tiffany Goodwin is a 25 y.o. female who presents to the Emergency Department complaining of vaginal discharge and odor for one week.  She states that she frequently get "BV" and believes that it has reoccurred.  States she was last treated in August for same and reports improvement after taking Flagyl.  She denies dysuria, abd or pelvic pain, fever, chills, or vaginal bleeding.  She states she has been with the same sexual partner but does not use protection.  Nothing makes her symptoms better or worse   Past Medical History  Diagnosis Date  . Anxiety   . Vaginal discharge 10/21/2014  . BV (bacterial vaginosis) 10/21/2014  . Pelvic pain in female 11/18/2014  . Pneumonia 2016,january    left side  . Screening for STD (sexually transmitted disease) 01/20/2015  . LLQ pain 04/19/2015  . Missed period 04/19/2015   Past Surgical History  Procedure Laterality Date  . Tonsillectomy    . Wisdom tooth extraction    . Wisdom tooth extraction     Family History  Problem Relation Age of Onset  . Anesthesia problems Neg Hx   . Hypotension Neg Hx   . Malignant hyperthermia Neg Hx   . Pseudochol deficiency Neg Hx   . Heart attack Maternal Grandmother   . Other Paternal Grandfather     brain aneursym  . Cancer Maternal Grandfather     bone,chest,liver,brain,pancreatic,throat   Social History  Substance Use Topics  . Smoking status: Never Smoker   . Smokeless tobacco: Never Used  . Alcohol Use: No   OB History    Gravida Para Term Preterm AB TAB SAB Ectopic Multiple Living   1 1 1  0 0 0 0 0 0 1     Review of Systems  Constitutional: Negative for fever, chills, activity change and appetite change.   Respiratory: Negative for shortness of breath.   Gastrointestinal: Negative for nausea, vomiting, abdominal pain and diarrhea.  Genitourinary: Positive for vaginal discharge. Negative for dysuria, frequency, decreased urine volume, vaginal bleeding, genital sores, menstrual problem and pelvic pain.       Vaginal odor  Musculoskeletal: Negative for back pain.  Skin: Negative for rash.  Neurological: Negative for headaches.  All other systems reviewed and are negative.     Allergies  Review of patient's allergies indicates no known allergies.  Home Medications   Prior to Admission medications   Medication Sig Start Date End Date Taking? Authorizing Provider  HYDROcodone-acetaminophen (NORCO/VICODIN) 5-325 MG per tablet Take 2 tablets by mouth every 4 (four) hours as needed for moderate pain. 07/05/15   Gilda Crease, MD  ibuprofen (ADVIL,MOTRIN) 600 MG tablet Take 1 tablet (600 mg total) by mouth every 6 (six) hours as needed. 09/15/15   Ivery Quale, PA-C  loratadine-pseudoephedrine (CLARITIN-D 12 HOUR) 5-120 MG tablet Take 1 tablet by mouth 2 (two) times daily. 09/15/15   Ivery Quale, PA-C  Norethindrone-Ethinyl Estradiol-Fe Biphas (LO LOESTRIN FE) 1 MG-10 MCG / 10 MCG tablet Take 1 tablet by mouth daily. Take 1 daily by mouth 09/06/15   Adline Potter, NP   BP 131/80 mmHg  Pulse 79  Temp(Src) 99.3 F (37.4 C) (Temporal)  Resp 20  Ht 5\' 7"  (  1.702 m)  Wt 68.04 kg  BMI 23.49 kg/m2  SpO2 100%  LMP 11/07/2015 Physical Exam  Constitutional: She is oriented to person, place, and time. She appears well-developed and well-nourished. No distress.  HENT:  Head: Normocephalic and atraumatic.  Mouth/Throat: Oropharynx is clear and moist.  Cardiovascular: Normal rate and regular rhythm.   Pulmonary/Chest: Effort normal and breath sounds normal. No respiratory distress.  Abdominal: Soft. She exhibits no distension. There is no tenderness. There is no rebound and no  guarding.  Genitourinary: Uterus normal. There is no rash or tenderness on the right labia. There is no tenderness on the left labia. Cervix exhibits no motion tenderness. Right adnexum displays no mass and no tenderness. Left adnexum displays no mass and no tenderness. No bleeding in the vagina. No foreign body around the vagina. Vaginal discharge found.  Pelvic exam was chaperoned by nurse.  Thin white, malodorous vaginal discharge without adnexal masses or tenderness, no CMT.  No vaginal bleeding  Musculoskeletal: Normal range of motion.  Neurological: She is oriented to person, place, and time. She exhibits normal muscle tone. Coordination normal.  Skin: Skin is warm and dry.  Nursing note and vitals reviewed.   ED Course  Procedures (including critical care time) Labs Review Labs Reviewed  WET PREP, GENITAL - Abnormal; Notable for the following:    Clue Cells Wet Prep HPF POC PRESENT (*)    WBC, Wet Prep HPF POC FEW (*)    All other components within normal limits  URINALYSIS, ROUTINE W REFLEX MICROSCOPIC ([facility])  POC URINE PREG, ED  GC/CHLAMYDIA PROBE AMP ([place]) [facility]    Imaging Review No results found. I have personally reviewed and evaluated these images and lab results as part of my medical decision-making.   EKG Interpretation None      MDM   Final diagnoses:  Bacterial vaginosis     Pt well appearing.  vitals stable.  Sx's c/w bacterial vaginosis.  Recurrent problem.  No abd or pelvic pain.  Pt given rx for flagyl.  Advised to f/u with health dept of [facility].  Stable for d/c   Pauline Ausammy Tuff Clabo, PA-C 11/22/15 0100  Rolland PorterMark James, MD 11/26/15 1219

## 2015-11-23 LAB — GC/CHLAMYDIA PROBE AMP (~~LOC~~) NOT AT ARMC
CHLAMYDIA, DNA PROBE: NEGATIVE
NEISSERIA GONORRHEA: NEGATIVE

## 2015-12-07 ENCOUNTER — Ambulatory Visit (INDEPENDENT_AMBULATORY_CARE_PROVIDER_SITE_OTHER): Payer: 59 | Admitting: Adult Health

## 2015-12-07 ENCOUNTER — Encounter: Payer: Self-pay | Admitting: Adult Health

## 2015-12-07 VITALS — BP 130/80 | HR 76 | Ht 67.0 in | Wt 141.5 lb

## 2015-12-07 DIAGNOSIS — L298 Other pruritus: Secondary | ICD-10-CM | POA: Diagnosis not present

## 2015-12-07 DIAGNOSIS — N898 Other specified noninflammatory disorders of vagina: Secondary | ICD-10-CM

## 2015-12-07 DIAGNOSIS — Z319 Encounter for procreative management, unspecified: Secondary | ICD-10-CM

## 2015-12-07 HISTORY — DX: Other specified noninflammatory disorders of vagina: N89.8

## 2015-12-07 HISTORY — DX: Encounter for procreative management, unspecified: Z31.9

## 2015-12-07 LAB — POCT WET PREP (WET MOUNT)

## 2015-12-07 MED ORDER — PRENATAL PLUS 27-1 MG PO TABS: 1.0000 | ORAL_TABLET | Freq: Every day | ORAL | Status: DC

## 2015-12-07 NOTE — Progress Notes (Signed)
Subjective:     Patient ID: Tiffany Goodwin, female   DOB: 1991-04-27, 25 y.o.   MRN: [id number]  HPI Tiffany Goodwin is a 25 year old white female, has vaginal discharge and itch.She stopped OCs 12/04/15 and wants to try to get pregnant.  Review of Systems Patient denies any headaches, hearing loss, fatigue, blurred vision, shortness of breath, chest pain, abdominal pain, problems with bowel movements, urination, or intercourse. No joint pain or mood swings.See HPI for positives. Reviewed past medical,surgical, social and family history. Reviewed medications and allergies.     Objective:   Physical Exam BP 130/80 mmHg  Pulse 76  Ht  (1.702 m)  Wt 141 lb 8 oz (64.184 kg)  BMI 22.16 kg/m2  LMP 12/01/2015 (Approximate) Skin warm and dry.Pelvic: external genitalia is normal in appearance no lesions, dry, vagina:scant white discharge without odor,urethra has no lesions or masses noted, cervix:smooth and bulbous, uterus: normal size, shape and contour, non tender, no masses felt, adnexa: no masses or tenderness noted. Bladder is non tender and no masses felt. Wet prep: few +WBCs, don't shave, as often, clip.Discussed timing of sex and ovulation and could take 6-18 months of active trying, pee before sex and lay there after sex.  Face time 15 minutes with 50% counseling.     Assessment:    Vaginal discharge Vaginal itching  Desires pregnancy    Plan:     Rx prenatal plus #30 take 1 daily with 11 refills Call with period, will check progesterone level day 21 and have sex every other day, day 7-24 of cycle Follow up prn

## 2015-12-07 NOTE — Patient Instructions (Signed)
Take prenatals  Call with me with period Remember timing of sex, every other day day 7-24 of cycle Will check progesterone level day 21 of cycle

## 2016-01-09 ENCOUNTER — Telehealth: Payer: Self-pay | Admitting: *Deleted

## 2016-01-09 NOTE — Telephone Encounter (Signed)
Left message I called 

## 2016-01-09 NOTE — Telephone Encounter (Signed)
Has missed a period, take HPT if negative be patient, may start, since having some cramps

## 2016-01-16 ENCOUNTER — Telehealth: Payer: Self-pay | Admitting: Adult Health

## 2016-01-16 DIAGNOSIS — Z319 Encounter for procreative management, unspecified: Secondary | ICD-10-CM

## 2016-01-16 NOTE — Telephone Encounter (Signed)
She called to say started period 2/25 will check progesterone level 3/17, and discussed timing of sex

## 2016-01-30 ENCOUNTER — Emergency Department (HOSPITAL_COMMUNITY)
Admission: EM | Admit: 2016-01-30 | Discharge: 2016-01-30 | Disposition: A | Discharge status: Home / Self Care | Payer: 59 | Attending: Dermatology | Admitting: Dermatology

## 2016-01-30 ENCOUNTER — Encounter (HOSPITAL_COMMUNITY): Payer: Self-pay | Admitting: *Deleted

## 2016-01-30 DIAGNOSIS — R102 Pelvic and perineal pain: Secondary | ICD-10-CM | POA: Diagnosis present

## 2016-01-30 DIAGNOSIS — Z5321 Procedure and treatment not carried out due to patient leaving prior to being seen by health care provider: Secondary | ICD-10-CM | POA: Insufficient documentation

## 2016-01-30 NOTE — ED Notes (Signed)
Pt c/o pelvic pain x 1 day; pt denies any urinary symptoms, states she has some discharge

## 2016-01-30 NOTE — ED Notes (Signed)
Pt left facility per registration. 

## 2016-03-07 ENCOUNTER — Encounter: Payer: Self-pay | Admitting: Adult Health

## 2016-03-07 ENCOUNTER — Ambulatory Visit (INDEPENDENT_AMBULATORY_CARE_PROVIDER_SITE_OTHER): Payer: 59 | Admitting: Adult Health

## 2016-03-07 VITALS — BP 120/62 | HR 74 | Ht 67.0 in | Wt 147.5 lb

## 2016-03-07 DIAGNOSIS — N926 Irregular menstruation, unspecified: Secondary | ICD-10-CM | POA: Diagnosis not present

## 2016-03-07 DIAGNOSIS — Z3202 Encounter for pregnancy test, result negative: Secondary | ICD-10-CM

## 2016-03-07 DIAGNOSIS — N898 Other specified noninflammatory disorders of vagina: Secondary | ICD-10-CM

## 2016-03-07 DIAGNOSIS — Z319 Encounter for procreative management, unspecified: Secondary | ICD-10-CM

## 2016-03-07 LAB — POCT WET PREP (WET MOUNT)
Clue Cells Wet Prep Whiff POC: NEGATIVE
WBC, Wet Prep HPF POC: POSITIVE

## 2016-03-07 LAB — POCT URINE PREGNANCY: PREG TEST UR: NEGATIVE

## 2016-03-07 NOTE — Progress Notes (Signed)
Subjective:     Patient ID: June LeapAmber N Lucchesi, female   DOB: 08/17/1991, 25 y.o.   MRN: [id number]  HPI Joice Loftsmber is a 25 year old white female, who has missed a period and has vaginal discharge with odor at times.She would like to be pregnant.  Review of Systems Patient denies any headaches, hearing loss, fatigue, blurred vision, shortness of breath, chest pain, abdominal pain, problems with bowel movements, urination, or intercourse. No joint pain or mood swings.See HPI for positives Reviewed past medical,surgical, social and family history. Reviewed medications and allergies.     Objective:   Physical Exam BP 120/62 mmHg  Pulse 74  Ht 5\' 7"  (1.702 m)  Wt 147 lb 8 oz (66.906 kg)  BMI 23.10 kg/m2  LMP 01/15/2016 UPT negative, Skin warm and dry.Pelvic: external genitalia is normal in appearance no lesions, vagina: white discharge without odor,urethra has no lesions or masses noted, cervix:smooth and bulbous, uterus: normal size, shape and contour, non tender, no masses felt, adnexa: no masses or tenderness noted. Bladder is non tender and no masses felt. Wet prep: +WBCs, negative,BV,yeast and trich. Discussed could try clomid if desires, [facility], and see if gets period this month, may not be ovulating.Face time 15 minutes with 50% counseling.    Assessment:     Missed period UPT Negative Vaginal discharge Desires pregnancy    Plan:     [facility] or not Follow up prn

## 2016-03-07 NOTE — Patient Instructions (Signed)
Call with next period

## 2016-05-18 ENCOUNTER — Emergency Department (HOSPITAL_COMMUNITY)
Admission: EM | Admit: 2016-05-18 | Discharge: 2016-05-18 | Disposition: A | Discharge status: Home / Self Care | Payer: 59 | Attending: Emergency Medicine | Admitting: Emergency Medicine

## 2016-05-18 ENCOUNTER — Encounter (HOSPITAL_COMMUNITY): Payer: Self-pay | Admitting: Emergency Medicine

## 2016-05-18 DIAGNOSIS — N760 Acute vaginitis: Secondary | ICD-10-CM | POA: Diagnosis not present

## 2016-05-18 DIAGNOSIS — N898 Other specified noninflammatory disorders of vagina: Secondary | ICD-10-CM | POA: Diagnosis present

## 2016-05-18 DIAGNOSIS — B379 Candidiasis, unspecified: Secondary | ICD-10-CM | POA: Diagnosis not present

## 2016-05-18 DIAGNOSIS — B9689 Other specified bacterial agents as the cause of diseases classified elsewhere: Secondary | ICD-10-CM

## 2016-05-18 LAB — URINALYSIS, ROUTINE W REFLEX MICROSCOPIC
Bilirubin Urine: NEGATIVE
Glucose, UA: NEGATIVE mg/dL
Hgb urine dipstick: NEGATIVE
Ketones, ur: NEGATIVE mg/dL
NITRITE: NEGATIVE
PH: 7 (ref 5.0–8.0)
SPECIFIC GRAVITY, URINE: 1.01 (ref 1.005–1.030)

## 2016-05-18 LAB — URINE MICROSCOPIC-ADD ON: RBC / HPF: NONE SEEN RBC/hpf (ref 0–5)

## 2016-05-18 LAB — WET PREP, GENITAL
Sperm: NONE SEEN
Trich, Wet Prep: NONE SEEN

## 2016-05-18 MED ORDER — FLUCONAZOLE 100 MG PO TABS
100.0000 mg | ORAL_TABLET | Freq: Once | ORAL | Status: AC
  Administered 2016-05-18: 100 mg via ORAL
  Filled 2016-05-18: qty 1

## 2016-05-18 MED ORDER — FLUCONAZOLE 200 MG PO TABS: 200.0000 mg | ORAL_TABLET | Freq: Once | ORAL | Status: AC

## 2016-05-18 MED ORDER — METRONIDAZOLE 500 MG PO TABS
500.0000 mg | ORAL_TABLET | Freq: Two times a day (BID) | ORAL | Status: DC
Start: 2016-05-18 — End: 2016-10-18

## 2016-05-18 MED ORDER — METRONIDAZOLE 500 MG PO TABS
500.0000 mg | ORAL_TABLET | Freq: Once | ORAL | Status: AC
  Administered 2016-05-18: 500 mg via ORAL
  Filled 2016-05-18: qty 1

## 2016-05-18 NOTE — ED Notes (Signed)
Vaginal discharge X2 days. Denies NV, denies abdominal pain.

## 2016-05-18 NOTE — ED Provider Notes (Signed)
CSN: [id number]     Arrival date & time 05/18/16  1309 History   First MD Initiated Contact with Patient 05/18/16 1324     Chief Complaint  Patient presents with  . Vaginal Discharge     (Consider location/radiation/quality/duration/timing/severity/associated sxs/prior Treatment) Patient is a 25 y.o. female presenting with vaginal discharge.  Vaginal Discharge Quality:  Thick and white Severity:  Mild Onset quality:  Gradual Duration:  3 days Timing:  Constant Chronicity:  Recurrent Context: not after intercourse   Relieved by:  Nothing Associated symptoms: no abdominal pain, no dysuria and no fever     Past Medical History  Diagnosis Date  . Anxiety   . Vaginal discharge 10/21/2014  . BV (bacterial vaginosis) 10/21/2014  . Pelvic pain in female 11/18/2014  . Pneumonia 2016,january    left side  . Screening for STD (sexually transmitted disease) 01/20/2015  . LLQ pain 04/19/2015  . Missed period 04/19/2015  . Itching in the vaginal area 12/07/2015  . Patient desires pregnancy 12/07/2015   Past Surgical History  Procedure Laterality Date  . Tonsillectomy    . Wisdom tooth extraction    . Wisdom tooth extraction     Family History  Problem Relation Age of Onset  . Anesthesia problems Neg Hx   . Hypotension Neg Hx   . Malignant hyperthermia Neg Hx   . Pseudochol deficiency Neg Hx   . Heart attack Maternal Grandmother   . Other Paternal Grandfather     brain aneursym  . Cancer Maternal Grandfather     bone,chest,liver,brain,pancreatic,throat   Social History  Substance Use Topics  . Smoking status: Never Smoker   . Smokeless tobacco: Never Used  . Alcohol Use: No   OB History    Gravida Para Term Preterm AB TAB SAB Ectopic Multiple Living   1 1 1  0 0 0 0 0 0 1     Review of Systems  Constitutional: Negative for fever.  HENT: Negative for congestion and facial swelling.   Eyes: Negative for discharge and redness.  Respiratory: Negative for cough and shortness  of breath.   Cardiovascular: Negative for chest pain.  Gastrointestinal: Negative for abdominal pain and abdominal distention.  Endocrine: Negative for polydipsia.  Genitourinary: Positive for vaginal discharge. Negative for dysuria, decreased urine volume and vaginal bleeding.  Musculoskeletal: Negative for back pain.  Skin: Negative for wound.  Neurological: Negative for headaches.      Allergies  Review of patient's allergies indicates no known allergies.  Home Medications   Prior to Admission medications   Medication Sig Start Date End Date Taking? Authorizing Provider  fluconazole (DIFLUCAN) 200 MG tablet Take 1 tablet (200 mg total) by mouth once. If symptoms persist. 05/22/16 05/29/16  Marily MemosJason Jarrett Chicoine, MD  metroNIDAZOLE (FLAGYL) 500 MG tablet Take 1 tablet (500 mg total) by mouth 2 (two) times daily. One po bid x 7 days 05/18/16   Marily MemosJason Maira Christon, MD   BP 116/72 mmHg  Pulse 74  Temp(Src) 98 F (36.7 C) (Oral)  Resp 16  Ht 5\' 7"  (1.702 m)  Wt 141 lb (63.957 kg)  BMI 22.08 kg/m2  SpO2 100%  LMP 04/26/2016 Physical Exam  Constitutional: She appears well-developed and well-nourished.  HENT:  Head: Normocephalic and atraumatic.  Neck: Normal range of motion.  Cardiovascular: Normal rate and regular rhythm.   Pulmonary/Chest: No stridor. No respiratory distress.  Abdominal: She exhibits no distension. Hernia confirmed negative in the right inguinal area and confirmed negative in the left inguinal  area.  Genitourinary: There is breast discharge. There is no rash or tenderness on the right labia. There is no rash or tenderness on the left labia.  Neurological: She is alert.  Skin: Skin is warm and dry.  Nursing note and vitals reviewed.   ED Course  Procedures (including critical care time) Labs Review Labs Reviewed  WET PREP, GENITAL - Abnormal; Notable for the following:    Yeast Wet Prep HPF POC PRESENT (*)    Clue Cells Wet Prep HPF POC PRESENT (*)    WBC, Wet Prep HPF  POC MANY (*)    All other components within normal limits  URINALYSIS, ROUTINE W REFLEX MICROSCOPIC (NOT AT [facility]) - Abnormal; Notable for the following:    Protein, ur TRACE (*)    Leukocytes, UA SMALL (*)    All other components within normal limits  URINE MICROSCOPIC-ADD ON - Abnormal; Notable for the following:    Squamous Epithelial / LPF 6-30 (*)    Bacteria, UA FEW (*)    All other components within normal limits  RPR  HIV ANTIBODY (ROUTINE TESTING)  GC/CHLAMYDIA PROBE AMP ([place]) NOT AT [facility]    Imaging Review No results found. I have personally reviewed and evaluated these images and lab results as part of my medical decision-making.   EKG Interpretation None      MDM   Final diagnoses:  Bacterial vaginosis  Yeast infection    Yeast and bv. Will tx for both.   New Prescriptions: Discharge Medication List as of 05/18/2016  2:55 PM    START taking these medications   Details  fluconazole (DIFLUCAN) 200 MG tablet Take 1 tablet (200 mg total) by mouth once. If symptoms persist., Starting 05/22/2016, Print    metroNIDAZOLE (FLAGYL) 500 MG tablet Take 1 tablet (500 mg total) by mouth 2 (two) times daily. One po bid x 7 days, Starting 05/18/2016, Until Discontinued, Print         I have personally and contemperaneously reviewed labs and imaging and used in my decision making as above.   A medical screening exam was performed and I feel the patient has had an appropriate workup for their chief complaint at this time and likelihood of emergent condition existing is low and thus workup can continue on an outpatient basis.. Their vital signs are stable. They have been counseled on decision, discharge, follow up and which symptoms necessitate immediate return to the emergency department.  They verbally stated understanding and agreement with plan and discharged in stable condition.   Marily MemosJason Kagan Mutchler, MD 05/18/16 1520

## 2016-05-19 LAB — RPR: RPR: NONREACTIVE

## 2016-05-19 LAB — HIV ANTIBODY (ROUTINE TESTING W REFLEX): HIV Screen 4th Generation wRfx: NONREACTIVE

## 2016-05-21 LAB — GC/CHLAMYDIA PROBE AMP (~~LOC~~) NOT AT ARMC
Chlamydia: NEGATIVE
Neisseria Gonorrhea: NEGATIVE

## 2016-07-29 ENCOUNTER — Encounter (HOSPITAL_COMMUNITY): Payer: Self-pay | Admitting: Emergency Medicine

## 2016-07-29 ENCOUNTER — Emergency Department (HOSPITAL_COMMUNITY)
Admission: EM | Admit: 2016-07-29 | Discharge: 2016-07-29 | Disposition: A | Discharge status: Home / Self Care | Payer: 59 | Attending: Emergency Medicine | Admitting: Emergency Medicine

## 2016-07-29 DIAGNOSIS — Y92410 Unspecified street and highway as the place of occurrence of the external cause: Secondary | ICD-10-CM | POA: Diagnosis not present

## 2016-07-29 DIAGNOSIS — Y999 Unspecified external cause status: Secondary | ICD-10-CM | POA: Insufficient documentation

## 2016-07-29 DIAGNOSIS — Y939 Activity, unspecified: Secondary | ICD-10-CM | POA: Insufficient documentation

## 2016-07-29 DIAGNOSIS — S39012A Strain of muscle, fascia and tendon of lower back, initial encounter: Secondary | ICD-10-CM | POA: Diagnosis not present

## 2016-07-29 DIAGNOSIS — R51 Headache: Secondary | ICD-10-CM | POA: Diagnosis not present

## 2016-07-29 DIAGNOSIS — Z791 Long term (current) use of non-steroidal anti-inflammatories (NSAID): Secondary | ICD-10-CM | POA: Insufficient documentation

## 2016-07-29 DIAGNOSIS — Z79899 Other long term (current) drug therapy: Secondary | ICD-10-CM | POA: Insufficient documentation

## 2016-07-29 DIAGNOSIS — S3992XA Unspecified injury of lower back, initial encounter: Secondary | ICD-10-CM | POA: Diagnosis present

## 2016-07-29 MED ORDER — IBUPROFEN 800 MG PO TABS
800.0000 mg | ORAL_TABLET | Freq: Once | ORAL | Status: AC
  Administered 2016-07-29: 800 mg via ORAL
  Filled 2016-07-29: qty 1

## 2016-07-29 MED ORDER — IBUPROFEN 800 MG PO TABS: 800.0000 mg | ORAL_TABLET | Freq: Three times a day (TID) | ORAL | 0 refills | Status: DC

## 2016-07-29 MED ORDER — CYCLOBENZAPRINE HCL 10 MG PO TABS
10.0000 mg | ORAL_TABLET | Freq: Once | ORAL | Status: AC
  Administered 2016-07-29: 10 mg via ORAL
  Filled 2016-07-29: qty 1

## 2016-07-29 MED ORDER — CYCLOBENZAPRINE HCL 5 MG PO TABS: 5.0000 mg | ORAL_TABLET | Freq: Three times a day (TID) | ORAL | 0 refills | Status: DC | PRN

## 2016-07-29 MED ORDER — HYDROCODONE-ACETAMINOPHEN 5-325 MG PO TABS: 1.0000 | ORAL_TABLET | Freq: Once | ORAL | Status: DC
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
## 2016-10-17 ENCOUNTER — Ambulatory Visit: Payer: 59 | Admitting: Obstetrics and Gynecology

## 2016-10-18 ENCOUNTER — Ambulatory Visit (INDEPENDENT_AMBULATORY_CARE_PROVIDER_SITE_OTHER): Payer: 59 | Admitting: Obstetrics and Gynecology

## 2016-10-18 ENCOUNTER — Encounter: Payer: Self-pay | Admitting: Obstetrics and Gynecology

## 2016-10-18 VITALS — BP 110/64 | HR 73 | Ht 66.0 in | Wt 139.8 lb

## 2016-10-18 DIAGNOSIS — N760 Acute vaginitis: Secondary | ICD-10-CM

## 2016-10-18 DIAGNOSIS — N898 Other specified noninflammatory disorders of vagina: Secondary | ICD-10-CM

## 2016-10-18 DIAGNOSIS — B9689 Other specified bacterial agents as the cause of diseases classified elsewhere: Secondary | ICD-10-CM | POA: Diagnosis not present

## 2016-10-18 MED ORDER — METRONIDAZOLE 500 MG PO TABS: 500.0000 mg | ORAL_TABLET | Freq: Two times a day (BID) | ORAL | 0 refills | Status: DC

## 2016-10-18 MED ORDER — METRONIDAZOLE 0.75 % VA GEL: 1.0000 | Freq: Every day | VAGINAL | 1 refills | Status: DC

## 2016-10-18 NOTE — Patient Instructions (Signed)
Please use the metronidazole pills x 7 days to CORRECT THE CURRENT INFECTION Please used the METROGEL VAGINAL GEL ONE TIME EACH MONTH AFTER YOUR MENSTRUAL PERIOD TO reduce re-occurrence.

## 2016-10-18 NOTE — Addendum Note (Signed)
Addended by: Criss AlvinePULLIAM, Daschel Roughton G on: 10/18/2016 03:24 PM   Modules accepted: Orders

## 2016-10-18 NOTE — Progress Notes (Signed)
   [facility] Visit  10/18/16            Patient name: Tiffany Leapmber N Piontek MRN [id number]  Date of birth: 03/26/1991  CC & HPI:  Tiffany Goodwin is a 25 y.o. female presenting today for persistent vaginal discharge with associated vaginal odor and vaginal/vular pain. She reports using condoms for birth control. She states intercourse does not change her discharge. She states her last period was on 10/11/16; states she missed her period the month prior. She reports having irregular periods after first pregnancy 5 years ago.   ROS:  ROS +vaginal discharge +vaginal odor +vaginal pain Otherwise negative Pertinent History Reviewed:   Reviewed: Significant for  Medical         Past Medical History:  Diagnosis Date  . Anxiety   . BV (bacterial vaginosis) 10/21/2014  . Itching in the vaginal area 12/07/2015  . LLQ pain 04/19/2015  . Missed period 04/19/2015  . Patient desires pregnancy 12/07/2015  . Pelvic pain in female 11/18/2014  . Pneumonia 2016,january   left side  . Screening for STD (sexually transmitted disease) 01/20/2015  . Vaginal discharge 10/21/2014                              Surgical Hx:    Past Surgical History:  Procedure Laterality Date  . TONSILLECTOMY    . WISDOM TOOTH EXTRACTION    . WISDOM TOOTH EXTRACTION     Medications: Reviewed & Updated - see associated section                      No current outpatient prescriptions on file.   Social History: Reviewed -  reports that she has never smoked. She has never used smokeless tobacco.  Objective Findings:  Vitals: Blood pressure 110/64, pulse 73, height 5\' 6"  (1.676 m), weight 139 lb 12.8 oz (63.4 kg), last menstrual period 10/11/2016.  Physical Examination: Pelvic - normal external genitalia, vulva, vagina, cervix  KOH +whiff  Wet Prep: +clue cells, +white cells, -trich  Assessment & Plan:   A:  1. BV  P:  1. Start metronidazole oral x 7 days 2. Metronidazole gel once after period for suppression    By signing my name below, I, Sonum Patel, attest that this documentation has been prepared under the direction and in the presence of Tilda BurrowJohn V Desirai Traxler, MD. Electronically Signed: Sonum Patel, Neurosurgeoncribe. 10/18/16. 2:51 PM.  I personally performed the services described in this documentation, which was SCRIBED in my presence. The recorded information has been reviewed and considered accurate. It has been edited as necessary during review. Tilda BurrowFERGUSON,Abraham Margulies V, MD

## 2016-10-22 LAB — GC/CHLAMYDIA PROBE AMP
Chlamydia trachomatis, NAA: NEGATIVE
Neisseria gonorrhoeae by PCR: NEGATIVE

## 2016-10-26 ENCOUNTER — Emergency Department (HOSPITAL_COMMUNITY): Payer: 59

## 2016-10-26 ENCOUNTER — Encounter (HOSPITAL_COMMUNITY): Payer: Self-pay | Admitting: Emergency Medicine

## 2016-10-26 ENCOUNTER — Emergency Department (HOSPITAL_COMMUNITY)
Admission: EM | Admit: 2016-10-26 | Discharge: 2016-10-26 | Disposition: A | Discharge status: Home / Self Care | Payer: 59 | Attending: Emergency Medicine | Admitting: Emergency Medicine

## 2016-10-26 DIAGNOSIS — G8929 Other chronic pain: Secondary | ICD-10-CM | POA: Diagnosis not present

## 2016-10-26 DIAGNOSIS — R102 Pelvic and perineal pain: Secondary | ICD-10-CM | POA: Diagnosis present

## 2016-10-26 LAB — URINALYSIS, ROUTINE W REFLEX MICROSCOPIC
GLUCOSE, UA: NEGATIVE mg/dL
Hgb urine dipstick: NEGATIVE
KETONES UR: NEGATIVE mg/dL
LEUKOCYTES UA: NEGATIVE
Nitrite: NEGATIVE
PROTEIN: NEGATIVE mg/dL
Specific Gravity, Urine: 1.02 (ref 1.005–1.030)
pH: 7.5 (ref 5.0–8.0)

## 2016-10-26 LAB — WET PREP, GENITAL
Clue Cells Wet Prep HPF POC: NONE SEEN
Sperm: NONE SEEN
TRICH WET PREP: NONE SEEN
Yeast Wet Prep HPF POC: NONE SEEN

## 2016-10-26 LAB — PREGNANCY, URINE: PREG TEST UR: NEGATIVE

## 2016-10-26 MED ORDER — KETOROLAC TROMETHAMINE 60 MG/2ML IM SOLN
60.0000 mg | Freq: Once | INTRAMUSCULAR | Status: AC
  Administered 2016-10-26: 60 mg via INTRAMUSCULAR
  Filled 2016-10-26: qty 2

## 2016-10-26 MED ORDER — NAPROXEN 250 MG PO TABS
250.0000 mg | ORAL_TABLET | Freq: Two times a day (BID) | ORAL | 0 refills | Status: DC | PRN
Start: 2016-10-26 — End: 2016-11-22

## 2016-10-26 NOTE — ED Provider Notes (Signed)
[facility]-EMER[facility]ENCY DEPT Provider Note   CSN: [id number] Arrival date & time: 10/26/16  [id number]     History   Chief Complaint Chief Complaint  Patient presents with  . Pelvic Pain    HPI Tiffany Goodwin is a 25 y.o. female.  HPI  Pt was seen at 1940. Per pt, c/o gradual onset and persistence of constant acute flair of her chronic pelvic "pain" that began earlier today. States she took motrin earlier without relief. Denies any change in her usual chronic pain pattern. Denies dysuria/hematuria, no vaginal bleeding/discharge, no back/flank pain, no N/V/D, no CP/SOB, no rash, no fevers.    Past Medical History:  Diagnosis Date  . Anxiety   . BV (bacterial vaginosis) 10/21/2014  . Itching in the vaginal area 12/07/2015  . LLQ pain 04/19/2015  . Missed period 04/19/2015  . Patient desires pregnancy 12/07/2015  . Pelvic pain in female 11/18/2014  . Pneumonia 2016,january   left side  . Screening for STD (sexually transmitted disease) 01/20/2015  . Vaginal discharge 10/21/2014    Patient Active Problem List   Diagnosis Date Noted  . Itching in the vaginal area 12/07/2015  . Patient desires pregnancy 12/07/2015  . LLQ pain 04/19/2015  . Missed period 04/19/2015  . Screening for STD (sexually transmitted disease) 01/20/2015  . Pelvic pain in female 11/18/2014  . Vaginal discharge 10/21/2014  . BV (bacterial vaginosis) 10/21/2014    Past Surgical History:  Procedure Laterality Date  . TONSILLECTOMY    . WISDOM TOOTH EXTRACTION    . WISDOM TOOTH EXTRACTION      OB History    Gravida Para Term Preterm AB Living   1 1 1  0 0 1   SAB TAB Ectopic Multiple Live Births   0 0 0 0 1       Home Medications    Prior to Admission medications   Medication Sig Start Date End Date Taking? Authorizing Provider  metroNIDAZOLE (FLAGYL) 500 MG tablet Take 1 tablet (500 mg total) by mouth 2 (two) times daily. 10/18/16  Yes Tilda BurrowJohn V Ferguson, MD  metroNIDAZOLE (METROGEL) 0.75 % vaginal gel Place 1  Applicatorful vaginally at bedtime. Apply one applicatorful to vagina at bedtime for 5 days Patient not taking: Reported on 10/26/2016 10/18/16   Tilda BurrowJohn V Ferguson, MD    Family History Family History  Problem Relation Age of Onset  . Heart attack Maternal Grandmother   . Other Paternal Grandfather     brain aneursym  . Cancer Maternal Grandfather     bone,chest,liver,brain,pancreatic,throat  . Anesthesia problems Neg Hx   . Hypotension Neg Hx   . Malignant hyperthermia Neg Hx   . Pseudochol deficiency Neg Hx     Social History Social History  Substance Use Topics  . Smoking status: Never Smoker  . Smokeless tobacco: Never Used  . Alcohol use No     Allergies   Patient has no known allergies.   Review of Systems Review of Systems ROS: Statement: All systems negative except as marked or noted in the HPI; Constitutional: Negative for fever and chills. ; ; Eyes: Negative for eye pain, redness and discharge. ; ; ENMT: Negative for ear pain, hoarseness, nasal congestion, sinus pressure and sore throat. ; ; Cardiovascular: Negative for chest pain, palpitations, diaphoresis, dyspnea and peripheral edema. ; ; Respiratory: Negative for cough, wheezing and stridor. ; ; Gastrointestinal: Negative for nausea, vomiting, diarrhea, abdominal pain, blood in stool, hematemesis, jaundice and rectal bleeding. . ; ; Genitourinary: Negative  for dysuria, flank pain and hematuria. ; ; GYN:  +pelvic pain, no vaginal bleeding, no vaginal discharge, no vulvar pain. ;;Musculoskeletal: Negative for back pain and neck pain. Negative for swelling and trauma.; ; Skin: Negative for pruritus, rash, abrasions, blisters, bruising and skin lesion.; ; Neuro: Negative for headache, lightheadedness and neck stiffness. Negative for weakness, altered level of consciousness, altered mental status, extremity weakness, paresthesias, involuntary movement, seizure and syncope.       Physical Exam Updated Vital Signs BP  136/81 (BP Location: Left Arm)   Pulse 76   Temp 98.4 F (36.9 C) (Oral)   Resp 16   Ht 5\' 6"  (1.676 m)   Wt 140 lb (63.5 kg)   LMP 10/11/2016   SpO2 100%   BMI 22.60 kg/m   Physical Exam 1945: Physical examination:  Nursing notes reviewed; Vital signs and O2 SAT reviewed;  Constitutional: Well developed, Well nourished, Well hydrated, In no acute distress; Head:  Normocephalic, atraumatic; Eyes: EOMI, PERRL, No scleral icterus; ENMT: Mouth and pharynx normal, Mucous membranes moist; Neck: Supple, Full range of motion, No lymphadenopathy; Cardiovascular: Regular rate and rhythm, No murmur, rub, or gallop; Respiratory: Breath sounds clear & equal bilaterally, No rales, rhonchi, wheezes.  Speaking full sentences with ease, Normal respiratory effort/excursion; Chest: Nontender, Movement normal; Abdomen: Soft, +mild suprapubic tenderness to palp. No rebound or guarding. Nondistended, Normal bowel sounds; Genitourinary: No CVA tenderness. Pelvic exam performed with permission of pt and female ED tech assist during exam.  External genitalia w/o lesions. Vaginal vault without discharge.  Cervix w/o lesions, not friable, GC/chlam and wet prep obtained and sent to lab.  Bimanual exam w/o CMT or adnexal tenderness, +suprapubic tenderness.;; Extremities: Pulses normal, No tenderness, No edema, No calf edema or asymmetry.; Neuro: AA&Ox3, Major CN grossly intact.  Speech clear. No gross focal motor or sensory deficits in extremities. Climbs on and off stretcher easily by herself. Gait upright and steady.; Skin: Color normal, Warm, Dry.   ED Treatments / Results  Labs (all labs ordered are listed, but only abnormal results are displayed)   EKG  EKG Interpretation None       Radiology   Procedures Procedures (including critical care time)  Medications Ordered in ED Medications  ketorolac (TORADOL) injection 60 mg (60 mg Intramuscular Given 10/26/16 1955)     Initial Impression / Assessment  and Plan / ED Course  I have reviewed the triage vital signs and the nursing notes.  Pertinent labs & imaging results that were available during my care of the patient were reviewed by me and considered in my medical decision making (see chart for details).  MDM Reviewed: previous chart, nursing note and vitals Reviewed previous: labs and ultrasound Interpretation: labs and x-ray   Results for orders placed or performed during the hospital encounter of 10/26/16  Wet prep, genital  Result Value Ref Range   Yeast Wet Prep HPF POC NONE SEEN NONE SEEN   Trich, Wet Prep NONE SEEN NONE SEEN   Clue Cells Wet Prep HPF POC NONE SEEN NONE SEEN   WBC, Wet Prep HPF POC FEW (A) NONE SEEN   Sperm NONE SEEN   Urinalysis, Routine w reflex microscopic  Result Value Ref Range   Color, Urine Lorian (A) YELLOW   APPearance CLEAR CLEAR   Specific Gravity, Urine 1.020 1.005 - 1.030   pH 7.5 5.0 - 8.0   Glucose, UA NEGATIVE NEGATIVE mg/dL   Hgb urine dipstick NEGATIVE NEGATIVE   Bilirubin Urine SMALL (  A) NEGATIVE   Ketones, ur NEGATIVE NEGATIVE mg/dL   Protein, ur NEGATIVE NEGATIVE mg/dL   Nitrite NEGATIVE NEGATIVE   Leukocytes, UA NEGATIVE NEGATIVE  Pregnancy, urine  Result Value Ref Range   Preg Test, Ur NEGATIVE NEGATIVE   Dg Abd Acute W/chest Result Date: 10/26/2016 CLINICAL DATA:  CB pubic pain, onset today. EXAM: DG ABDOMEN ACUTE W/ 1V CHEST COMPARISON:  None. FINDINGS: The cardiomediastinal contours are normal. The lungs are clear. There is no free intra-abdominal air. No dilated bowel loops to suggest obstruction. Moderate volume of stool throughout the entire colon. No radiopaque calculi. Phleboliths in the pelvis. No acute osseous abnormalities are seen. IMPRESSION: Negative abdominal radiographs.  No acute cardiopulmonary disease. Electronically Signed   By: Rubye Oaks M.D.   On: 10/26/2016 21:57    2210:  Workup reassuring. Tx symptomatically at this time. Long hx of chronic pain  with multiple ED and OB/GYN office visits for same.  Pt endorses acute flair of her usual long standing chronic pain today, no change from her usual chronic pain pattern.  Pt encouraged to f/u with her OB/GYN MD for good continuity of care and control of her chronic pain.  Pt verb understanding. Dx and testing d/w pt.  Questions answered.  Verb understanding, agreeable to d/c home with outpt f/u.    Final Clinical Impressions(s) / ED Diagnoses   Final diagnoses:  None    New Prescriptions New Prescriptions   No medications on file     Samuel Jester, DO 10/29/16 [id number]

## 2016-10-26 NOTE — ED Notes (Signed)
Dropped off urine to lab for Nurse YW

## 2016-10-26 NOTE — Discharge Instructions (Signed)
Take the prescription as directed.  Also take over the counter tylenol, as directed on packaging, as needed for discomfort. Apply moist heat to the area(s) of discomfort, for 15 minutes at a time, several times per day for the next few days.  Do not fall asleep on a heating pack.  Call your regular OB/GYN doctor on Monday to schedule a follow up appointment this week.  Return to the Emergency Department immediately if worsening.

## 2016-10-26 NOTE — ED Triage Notes (Signed)
Pt reports lower pelvic pain that started today. No known injury.

## 2016-10-26 NOTE — ED Notes (Signed)
Dropped spec off to lab for nurse YW

## 2016-10-29 LAB — GC/CHLAMYDIA PROBE AMP (~~LOC~~) NOT AT ARMC
CHLAMYDIA, DNA PROBE: NEGATIVE
NEISSERIA GONORRHEA: NEGATIVE

## 2016-11-15 ENCOUNTER — Telehealth: Payer: Self-pay | Admitting: *Deleted

## 2016-11-15 MED ORDER — FLUCONAZOLE 150 MG PO TABS: ORAL_TABLET | ORAL | 0 refills | Status: DC

## 2016-11-15 NOTE — Telephone Encounter (Signed)
Patient thinks she has a yeast infection. She is experiencing itching with no discharge. She has taken diflucan in the past but has not tried anything over the counter. She does not have the copay to be seen. Please advise.

## 2016-11-15 NOTE — Telephone Encounter (Signed)
No voice mail, I sent rx for diflucan in to [facility]

## 2016-11-19 NOTE — L&D Delivery Note (Addendum)
Patient is 26 y.o. G2P1001 1672w2d admitted for SROM. S/p augmentation with cytotec, followed by Pitocin intrapartum.  Prenatal course also complicated by: None.  Delivery Note At 8:33 PM a viable female was delivered via  (Presentation: ROA).  APGAR: 6, 9; weight pending.  Placenta status: Intact.  Cord: 3V with the following complications: None.  Cord pH: N/A  Anesthesia: Epidural Episiotomy: None  Lacerations:  None Est. Blood Loss (mL):  350  Head delivered ROA with compound hand presentation. No nuchal cord present; however right arm cord. Shoulder and body delivered in usual fashion. Infant with spontaneous cry, placed on mother's abdomen, dried and bulb suctioned. Cord clamped x 2 after 1-minute delay, and cut. Cord blood drawn. Placenta delivered spontaneously with gentle cord traction. Fundus firm with massage and Pitocin. Perineum inspected and found to have no laceration with good hemostasis achieved.   Mom to postpartum.  Baby to Couplet care / Skin to Skin.  Caryl AdaJazma Phelps, DO OB Fellow

## 2016-11-22 ENCOUNTER — Ambulatory Visit (INDEPENDENT_AMBULATORY_CARE_PROVIDER_SITE_OTHER): Payer: 59 | Admitting: Advanced Practice Midwife

## 2016-11-22 ENCOUNTER — Encounter: Payer: Self-pay | Admitting: Advanced Practice Midwife

## 2016-11-22 VITALS — BP 94/53 | HR 72 | Wt 138.0 lb

## 2016-11-22 DIAGNOSIS — N898 Other specified noninflammatory disorders of vagina: Secondary | ICD-10-CM

## 2016-11-22 DIAGNOSIS — Z789 Other specified health status: Secondary | ICD-10-CM

## 2016-11-22 DIAGNOSIS — Z30011 Encounter for initial prescription of contraceptive pills: Secondary | ICD-10-CM | POA: Diagnosis not present

## 2016-11-22 MED ORDER — METRONIDAZOLE 500 MG PO TABS: 500.0000 mg | ORAL_TABLET | Freq: Two times a day (BID) | ORAL | 1 refills | Status: DC

## 2016-11-22 MED ORDER — NORGESTIMATE-ETH ESTRADIOL 0.25-35 MG-MCG PO TABS: 1.0000 | ORAL_TABLET | Freq: Every day | ORAL | 11 refills | Status: DC

## 2016-11-22 NOTE — Progress Notes (Signed)
CHIEF COMPLAINT/HPI:  26 y.o. female complains of white vaginal discharge for 1 week(s). Also says there is a slight "odor". Denies abnormal vaginal bleeding, significant pelvic pain or fever. No UTI symptoms.   Denies history of known exposure to STD or symptoms in partner.  Patient's last menstrual period was 11/12/2016.  Has not tried and OTC med. Also wants to get on COCs.   Review of Systems  Constitutional: Negative for fever and chills Eyes: Negative for visual disturbances Respiratory: Negative for shortness of breath, dyspnea Cardiovascular: Negative for chest pain or palpitations  Gastrointestinal: Negative for vomiting, diarrhea and constipation Genitourinary: Negative for dysuria and urgency Musculoskeletal: Negative for back pain, joint pain, myalgias  Neurological: Negative for dizziness and headaches    Past Medical History: Past Medical History:  Diagnosis Date  . Anxiety   . BV (bacterial vaginosis) 10/21/2014  . Itching in the vaginal area 12/07/2015  . LLQ pain 04/19/2015  . Missed period 04/19/2015  . Patient desires pregnancy 12/07/2015  . Pelvic pain in female 11/18/2014  . Pneumonia 2016,january   left side  . Screening for STD (sexually transmitted disease) 01/20/2015  . Vaginal discharge 10/21/2014    Past Surgical History: Past Surgical History:  Procedure Laterality Date  . TONSILLECTOMY    . WISDOM TOOTH EXTRACTION    . WISDOM TOOTH EXTRACTION      Obstetrical History: OB History    Gravida Para Term Preterm AB Living   1 1 1  0 0 1   SAB TAB Ectopic Multiple Live Births   0 0 0 0 1       Social History: Social History   Social History  . Marital status: Single    Spouse name: N/A  . Number of children: N/A  . Years of education: N/A   Social History Main Topics  . Smoking status: Never Smoker  . Smokeless tobacco: Never Used  . Alcohol use No  . Drug use: No  . Sexual activity: Yes    Birth control/ protection: None   Other Topics  Concern  . None   Social History Narrative  . None    Family History: Family History  Problem Relation Age of Onset  . Heart attack Maternal Grandmother   . Other Paternal Grandfather     brain aneursym  . Cancer Maternal Grandfather     bone,chest,liver,brain,pancreatic,throat  . Anesthesia problems Neg Hx   . Hypotension Neg Hx   . Malignant hyperthermia Neg Hx   . Pseudochol deficiency Neg Hx     Allergies: No Known Allergies      PHYSICAL EXAM: Physical Examination: General appearance - well appearing, and in no distress Mental status - alert, oriented to person, place, and time Chest:  Normal respiratory effort Heart - normal rate and regular rhythm Abdomen:  Soft, nontender Pelvic: SSE:  Normal appearing discharge, wet prep - trich, - clue, - wbc, - yeast, no odor.  Musculoskeletal:  Normal range of motion without pain Extremities:  No edema    Labs: No results found for this or any previous visit (from the past 24 hour(s)).   Assessment: Normal discharge.  Perhaps just hormonal fluctuations.    Plan:  No orders of the defined types were placed in this encounter.  Rx sprintec.  Start now. BU for 3 weeks.   CRESENZO-DISHMAN,Kynslie Ringle 11/22/16 9:27 AM

## 2016-12-26 ENCOUNTER — Telehealth: Payer: Self-pay | Admitting: *Deleted

## 2016-12-26 NOTE — Telephone Encounter (Signed)
Patient called stating she is having vaginal itching with discharge, no odor. She suspects she may have a yeast infection. She has taken 1 dose of Diflucan. I advised patient to take the other tablet and to call back in a couple of days and make an appointment if her symptoms are not better. Pt verbalized understanding.

## 2017-02-14 ENCOUNTER — Telehealth: Payer: Self-pay | Admitting: Adult Health

## 2017-02-14 NOTE — Telephone Encounter (Signed)
LMOVM that early pregnancy bleeding can be normal along with cramping, however if she is saturating more than a couple of pads an hour, she needed to be seen.

## 2017-02-14 NOTE — Telephone Encounter (Signed)
Pt called stating that she has been having pain with bleeding today, pt states she took a preg test at home. Please contact pt

## 2017-02-19 ENCOUNTER — Telehealth: Payer: Self-pay | Admitting: *Deleted

## 2017-02-19 NOTE — Telephone Encounter (Signed)
Patient called stating she has not had a BM in over a week and wanted to know what she could take. Advised patient to try Colace and to also increase fluids. Pt verbalized understanding.

## 2017-02-25 ENCOUNTER — Encounter: Payer: Self-pay | Admitting: Adult Health

## 2017-02-25 ENCOUNTER — Ambulatory Visit (INDEPENDENT_AMBULATORY_CARE_PROVIDER_SITE_OTHER): Payer: Medicaid Other | Admitting: Adult Health

## 2017-02-25 VITALS — BP 112/58 | HR 64 | Ht 67.0 in | Wt 152.0 lb

## 2017-02-25 DIAGNOSIS — Z3201 Encounter for pregnancy test, result positive: Secondary | ICD-10-CM

## 2017-02-25 DIAGNOSIS — Z3490 Encounter for supervision of normal pregnancy, unspecified, unspecified trimester: Secondary | ICD-10-CM

## 2017-02-25 DIAGNOSIS — O3680X0 Pregnancy with inconclusive fetal viability, not applicable or unspecified: Secondary | ICD-10-CM

## 2017-02-25 DIAGNOSIS — N926 Irregular menstruation, unspecified: Secondary | ICD-10-CM

## 2017-02-25 LAB — POCT URINE PREGNANCY: PREG TEST UR: POSITIVE — AB

## 2017-02-25 NOTE — Patient Instructions (Signed)
First Trimester of Pregnancy The first trimester of pregnancy is from week 1 until the end of week 13 (months 1 through 3). A week after a sperm fertilizes an egg, the egg will implant on the wall of the uterus. This embryo will begin to develop into a baby. Genes from you and your partner will form the baby. The female genes will determine whether the baby will be a boy or a girl. At 6-8 weeks, the eyes and face will be formed, and the heartbeat can be seen on ultrasound. At the end of 12 weeks, all the baby's organs will be formed. Now that you are pregnant, you will want to do everything you can to have a healthy baby. Two of the most important things are to get good prenatal care and to follow your health care provider's instructions. Prenatal care is all the medical care you receive before the baby's birth. This care will help prevent, find, and treat any problems during the pregnancy and childbirth. Body changes during your first trimester Your body goes through many changes during pregnancy. The changes vary from woman to woman.  You may gain or lose a couple of pounds at first.  You may feel sick to your stomach (nauseous) and you may throw up (vomit). If the vomiting is uncontrollable, call your health care provider.  You may tire easily.  You may develop headaches that can be relieved by medicines. All medicines should be approved by your health care provider.  You may urinate more often. Painful urination may mean you have a bladder infection.  You may develop heartburn as a result of your pregnancy.  You may develop constipation because certain hormones are causing the muscles that push stool through your intestines to slow down.  You may develop hemorrhoids or swollen veins (varicose veins).  Your breasts may begin to grow larger and become tender. Your nipples may stick out more, and the tissue that surrounds them (areola) may become darker.  Your gums may bleed and may be  sensitive to brushing and flossing.  Dark spots or blotches (chloasma, mask of pregnancy) may develop on your face. This will likely fade after the baby is born.  Your menstrual periods will stop.  You may have a loss of appetite.  You may develop cravings for certain kinds of food.  You may have changes in your emotions from day to day, such as being excited to be pregnant or being concerned that something may go wrong with the pregnancy and baby.  You may have more vivid and strange dreams.  You may have changes in your hair. These can include thickening of your hair, rapid growth, and changes in texture. Some women also have hair loss during or after pregnancy, or hair that feels dry or thin. Your hair will most likely return to normal after your baby is born.  What to expect at prenatal visits During a routine prenatal visit:  You will be weighed to make sure you and the baby are growing normally.  Your blood pressure will be taken.  Your abdomen will be measured to track your baby's growth.  The fetal heartbeat will be listened to between weeks 10 and 14 of your pregnancy.  Test results from any previous visits will be discussed.  Your health care provider may ask you:  How you are feeling.  If you are feeling the baby move.  If you have had any abnormal symptoms, such as leaking fluid, bleeding, severe headaches,   or abdominal cramping.  If you are using any tobacco products, including cigarettes, chewing tobacco, and electronic cigarettes.  If you have any questions.  Other tests that may be performed during your first trimester include:  Blood tests to find your blood type and to check for the presence of any previous infections. The tests will also be used to check for low iron levels (anemia) and protein on red blood cells (Rh antibodies). Depending on your risk factors, or if you previously had diabetes during pregnancy, you may have tests to check for high blood  sugar that affects pregnant women (gestational diabetes).  Urine tests to check for infections, diabetes, or protein in the urine.  An ultrasound to confirm the proper growth and development of the baby.  Fetal screens for spinal cord problems (spina bifida) and Down syndrome.  HIV (human immunodeficiency virus) testing. Routine prenatal testing includes screening for HIV, unless you choose not to have this test.  You may need other tests to make sure you and the baby are doing well.  Follow these instructions at home: Medicines  Follow your health care provider's instructions regarding medicine use. Specific medicines may be either safe or unsafe to take during pregnancy.  Take a prenatal vitamin that contains at least 600 micrograms (mcg) of folic acid.  If you develop constipation, try taking a stool softener if your health care provider approves. Eating and drinking  Eat a balanced diet that includes fresh fruits and vegetables, whole grains, good sources of protein such as meat, eggs, or tofu, and low-fat dairy. Your health care provider will help you determine the amount of weight gain that is right for you.  Avoid raw meat and uncooked cheese. These carry germs that can cause birth defects in the baby.  Eating four or five small meals rather than three large meals a day may help relieve nausea and vomiting. If you start to feel nauseous, eating a few soda crackers can be helpful. Drinking liquids between meals, instead of during meals, also seems to help ease nausea and vomiting.  Limit foods that are high in fat and processed sugars, such as fried and sweet foods.  To prevent constipation: ? Eat foods that are high in fiber, such as fresh fruits and vegetables, whole grains, and beans. ? Drink enough fluid to keep your urine clear or pale yellow. Activity  Exercise only as directed by your health care provider. Most women can continue their usual exercise routine during  pregnancy. Try to exercise for 30 minutes at least 5 days a week. Exercising will help you: ? Control your weight. ? Stay in shape. ? Be prepared for labor and delivery.  Experiencing pain or cramping in the lower abdomen or lower back is a good sign that you should stop exercising. Check with your health care provider before continuing with normal exercises.  Try to avoid standing for long periods of time. Move your legs often if you must stand in one place for a long time.  Avoid heavy lifting.  Wear low-heeled shoes and practice good posture.  You may continue to have sex unless your health care provider tells you not to. Relieving pain and discomfort  Wear a good support bra to relieve breast tenderness.  Take warm sitz baths to soothe any pain or discomfort caused by hemorrhoids. Use hemorrhoid cream if your health care provider approves.  Rest with your legs elevated if you have leg cramps or low back pain.  If you develop   varicose veins in your legs, wear support hose. Elevate your feet for 15 minutes, 3-4 times a day. Limit salt in your diet. Prenatal care  Schedule your prenatal visits by the twelfth week of pregnancy. They are usually scheduled monthly at first, then more often in the last 2 months before delivery.  Write down your questions. Take them to your prenatal visits.  Keep all your prenatal visits as told by your health care provider. This is important. Safety  Wear your seat belt at all times when driving.  Make a list of emergency phone numbers, including numbers for family, friends, the hospital, and police and fire departments. General instructions  Ask your health care provider for a referral to a local prenatal education class. Begin classes no later than the beginning of month 6 of your pregnancy.  Ask for help if you have counseling or nutritional needs during pregnancy. Your health care provider can offer advice or refer you to specialists for help  with various needs.  Do not use hot tubs, steam rooms, or saunas.  Do not douche or use tampons or scented sanitary pads.  Do not cross your legs for long periods of time.  Avoid cat litter boxes and soil used by cats. These carry germs that can cause birth defects in the baby and possibly loss of the fetus by miscarriage or stillbirth.  Avoid all smoking, herbs, alcohol, and medicines not prescribed by your health care provider. Chemicals in these products affect the formation and growth of the baby.  Do not use any products that contain nicotine or tobacco, such as cigarettes and e-cigarettes. If you need help quitting, ask your health care provider. You may receive counseling support and other resources to help you quit.  Schedule a dentist appointment. At home, brush your teeth with a soft toothbrush and be gentle when you floss. Contact a health care provider if:  You have dizziness.  You have mild pelvic cramps, pelvic pressure, or nagging pain in the abdominal area.  You have persistent nausea, vomiting, or diarrhea.  You have a bad smelling vaginal discharge.  You have pain when you urinate.  You notice increased swelling in your face, hands, legs, or ankles.  You are exposed to fifth disease or chickenpox.  You are exposed to German measles (rubella) and have never had it. Get help right away if:  You have a fever.  You are leaking fluid from your vagina.  You have spotting or bleeding from your vagina.  You have severe abdominal cramping or pain.  You have rapid weight gain or loss.  You vomit blood or material that looks like coffee grounds.  You develop a severe headache.  You have shortness of breath.  You have any kind of trauma, such as from a fall or a car accident. Summary  The first trimester of pregnancy is from week 1 until the end of week 13 (months 1 through 3).  Your body goes through many changes during pregnancy. The changes vary from  woman to woman.  You will have routine prenatal visits. During those visits, your health care provider will examine you, discuss any test results you may have, and talk with you about how you are feeling. This information is not intended to replace advice given to you by your health care provider. Make sure you discuss any questions you have with your health care provider. Document Released: 10/30/2001 Document Revised: 10/17/2016 Document Reviewed: 10/17/2016 Elsevier Interactive Patient Education  2017 [employer]  

## 2017-02-25 NOTE — Progress Notes (Signed)
Subjective:     Patient ID: Tiffany Goodwin, female   DOB: [birth year], 26 y.o.   MRN: [id number]  HPI Tiffany Goodwin is a 26 year old white female in for UPT, has missed a period and had 2+HPT. She has had nausea and heartburn at times and constipation and is on stool softener.She works at [employer] and has forms, for work, will leave for completion.   Review of Systems +missed period with 2+HPT +nausea at times +heartburn at times  +constipation on stool softener  Reviewed past medical,surgical, social and family history. Reviewed medications and allergies.     Objective:   Physical Exam BP (!) 112/58 (BP Location: Right Arm, Patient Position: Sitting, Cuff Size: Normal)   Pulse 64   Ht  (1.702 m)   Wt 152 lb (68.9 kg)   LMP 01/04/2017   BMI 23.81 kg/m UPT +, about 7+3 weeks by LMP with EDD 10/11/17. Skin warm and dry. Neck: mid line trachea, normal thyroid, good ROM, no lymphadenopathy noted. Lungs: clear to ausculation bilaterally. Cardiovascular: regular rate and rhythm.Abdomen is soft and non tender.    Assessment:     1. Positive urine pregnancy test   2. Pregnancy, unspecified gestational age   63. Encounter to determine fetal viability of pregnancy, single or unspecified fetus       Plan:   Eat often  Return in 1 week for dating [place] Review handout on first trimester

## 2017-03-04 ENCOUNTER — Telehealth: Payer: Self-pay | Admitting: Adult Health

## 2017-03-04 ENCOUNTER — Ambulatory Visit (INDEPENDENT_AMBULATORY_CARE_PROVIDER_SITE_OTHER): Payer: Medicaid Other

## 2017-03-04 DIAGNOSIS — O3680X0 Pregnancy with inconclusive fetal viability, not applicable or unspecified: Secondary | ICD-10-CM

## 2017-03-04 MED ORDER — CALCIUM CARBONATE ANTACID 500 MG PO CHEW: CHEWABLE_TABLET | ORAL | Status: DC

## 2017-03-04 MED ORDER — PROMETHAZINE HCL 25 MG PO TABS: 25.0000 mg | ORAL_TABLET | Freq: Four times a day (QID) | ORAL | 1 refills | Status: DC | PRN

## 2017-03-04 NOTE — Telephone Encounter (Signed)
Pt complains of nausea and indigestion,try TUMS and will rx phenergan

## 2017-03-04 NOTE — Progress Notes (Signed)
[place] 7+4 wks,single IUP w/ys,pos.fht 153 bpm,normal ovaries bilat,crl 13.07 mm,EDD 10/17/2017

## 2017-03-11 ENCOUNTER — Encounter: Payer: Self-pay | Admitting: Women's Health

## 2017-03-11 ENCOUNTER — Ambulatory Visit: Payer: Medicaid Other | Admitting: *Deleted

## 2017-03-11 ENCOUNTER — Ambulatory Visit (INDEPENDENT_AMBULATORY_CARE_PROVIDER_SITE_OTHER): Payer: Medicaid Other | Admitting: Adult Health

## 2017-03-11 VITALS — BP 117/80 | HR 70 | Wt 153.0 lb

## 2017-03-11 DIAGNOSIS — Z3A08 8 weeks gestation of pregnancy: Secondary | ICD-10-CM

## 2017-03-11 DIAGNOSIS — Z3401 Encounter for supervision of normal first pregnancy, first trimester: Secondary | ICD-10-CM

## 2017-03-11 DIAGNOSIS — Z1389 Encounter for screening for other disorder: Secondary | ICD-10-CM

## 2017-03-11 DIAGNOSIS — Z331 Pregnant state, incidental: Secondary | ICD-10-CM

## 2017-03-11 DIAGNOSIS — Z3490 Encounter for supervision of normal pregnancy, unspecified, unspecified trimester: Secondary | ICD-10-CM | POA: Insufficient documentation

## 2017-03-11 LAB — POCT URINALYSIS DIPSTICK
Blood, UA: NEGATIVE
GLUCOSE UA: NEGATIVE
Ketones, UA: NEGATIVE
LEUKOCYTES UA: NEGATIVE
NITRITE UA: NEGATIVE
Protein, UA: NEGATIVE

## 2017-03-11 NOTE — Progress Notes (Signed)
Subjective:  Tiffany Goodwin is a 26 y.o. G8P1001 Caucasian female at [redacted]w[redacted]d by LMP and [place] being seen today for her first obstetrical visit.  Her obstetrical history is significant for totally normal she says .  Pregnancy history fully reviewed.  Patient reports heartburn.And nausea, has phenergan. Denies vb, cramping, uti s/s, abnormal/malodorous vag d/c, or vulvovaginal itching/irritation.  BP 117/80   Pulse 70   Wt 153 lb (69.4 kg)   LMP 01/04/2017   BMI 23.96 kg/m   HISTORY: OB History  Gravida Para Term Preterm AB Living  0 0 1  SAB TAB Ectopic Multiple Live Births  0 0 0 0 1    # Outcome Date GA Lbr Len/2nd Weight Sex Delivery Anes PTL Lv  2 Current           1 Term 11/26/11 [redacted]w[redacted]d 09:06 / 01:00 6 lb 13.8 oz (3.113 kg) F Vag-Vacuum EPI  LIV     Birth Comments: caput, moulding     Past Medical History:  Diagnosis Date  . Anxiety   . BV (bacterial vaginosis) 10/21/2014  . Itching in the vaginal area 12/07/2015  . LLQ pain 04/19/2015  . Missed period 04/19/2015  . Patient desires pregnancy 12/07/2015  . Pelvic pain in female 11/18/2014  . Pneumonia 2016,january   left side  . Screening for STD (sexually transmitted disease) 01/20/2015  . Vaginal discharge 10/21/2014   Past Surgical History:  Procedure Laterality Date  . TONSILLECTOMY    . WISDOM TOOTH EXTRACTION    . WISDOM TOOTH EXTRACTION     Family History  Problem Relation Age of Onset  . Heart attack Maternal Grandmother   . Stroke Maternal Grandmother   . Other Paternal Grandfather     brain aneursym  . Cancer Maternal Grandfather     bone,chest,liver,brain,pancreatic,throat  . Anesthesia problems Neg Hx   . Hypotension Neg Hx   . Malignant hyperthermia Neg Hx   . Pseudochol deficiency Neg Hx     Exam   System:     General: Well developed & nourished, no acute distress   Skin: Warm & dry, normal coloration and turgor, no rashes   Neurologic: Alert & oriented, normal mood   Cardiovascular: Regular  rate & rhythm   Respiratory: Effort & rate normal, LCTAB, acyanotic   Abdomen: Soft, non tender   Extremities: normal strength, tone   Pelvic Exam:    Perineum: deferred   Vulva: deferred   Vagina:  deferred   Cervix: deferred   Uterus: deferred   FHR +US   Assessment:   Pregnancy: G2P1001 Patient Active Problem List   Diagnosis Date Noted  . Supervision of normal first pregnancy 03/11/2017  . Pregnancy 02/25/2017  . Positive urine pregnancy test 02/25/2017  . Missed period 04/19/2015  . Pelvic pain in female 11/18/2014  . Vaginal discharge 10/21/2014  . BV (bacterial vaginosis) 10/21/2014    [redacted]w[redacted]d G2P1001 New OB visit     Plan:  Initial labs drawn Continue prenatal vitamins Problem list reviewed and updated Reviewed n/v relief measures and warning s/s to report Reviewed recommended weight gain based on pre-gravid BMI Encouraged well-balanced diet Genetic Screening discussed Integrated Screen: declined Cystic fibrosis screening discussed declined Ultrasound discussed; fetal survey: requested Follow up in 4 weeks for OB visit   Adline Potter, NP 03/11/2017 2:51 PM

## 2017-03-12 ENCOUNTER — Telehealth: Payer: Self-pay | Admitting: *Deleted

## 2017-03-12 LAB — PMP SCREEN PROFILE (10S), URINE
Amphetamine Screen, Ur: NEGATIVE ng/mL
BENZODIAZEPINE SCREEN, URINE: NEGATIVE ng/mL
Barbiturate Screen, Ur: NEGATIVE ng/mL
CREATININE(CRT), U: 83.4 mg/dL (ref 20.0–300.0)
Cannabinoids Ur Ql Scn: NEGATIVE ng/mL
Cocaine(Metab.)Screen, Urine: NEGATIVE ng/mL
METHADONE SCREEN, URINE: NEGATIVE ng/mL
OXYCODONE+OXYMORPHONE UR QL SCN: NEGATIVE ng/mL
Opiate Scrn, Ur: NEGATIVE ng/mL
PCP Scrn, Ur: NEGATIVE ng/mL
PH UR, DRUG SCRN: 5.9 (ref 4.5–8.9)
PROPOXYPHENE SCREEN: NEGATIVE ng/mL

## 2017-03-12 LAB — CBC
HEMOGLOBIN: 13 g/dL (ref 11.1–15.9)
Hematocrit: 38.9 % (ref 34.0–46.6)
MCH: 31.3 pg (ref 26.6–33.0)
MCHC: 33.4 g/dL (ref 31.5–35.7)
MCV: 94 fL (ref 79–97)
Platelets: 369 10*3/uL (ref 150–379)
RBC: 4.16 x10E6/uL (ref 3.77–5.28)
RDW: 12.7 % (ref 12.3–15.4)
WBC: 10.6 10*3/uL (ref 3.4–10.8)

## 2017-03-12 LAB — URINALYSIS, ROUTINE W REFLEX MICROSCOPIC
BILIRUBIN UA: NEGATIVE
Glucose, UA: NEGATIVE
KETONES UA: NEGATIVE
LEUKOCYTES UA: NEGATIVE
NITRITE UA: NEGATIVE
Protein, UA: NEGATIVE
RBC UA: NEGATIVE
SPEC GRAV UA: 1.018 (ref 1.005–1.030)
Urobilinogen, Ur: 0.2 mg/dL (ref 0.2–1.0)
pH, UA: 6 (ref 5.0–7.5)

## 2017-03-12 LAB — ABO/RH: Rh Factor: POSITIVE

## 2017-03-12 LAB — VARICELLA ZOSTER ANTIBODY, IGG: Varicella zoster IgG: 971 index (ref >165)

## 2017-03-12 LAB — HIV ANTIBODY (ROUTINE TESTING W REFLEX): HIV Screen 4th Generation wRfx: NONREACTIVE

## 2017-03-12 LAB — RUBELLA SCREEN: RUBELLA: 10.1 {index} (ref >0.99)

## 2017-03-12 LAB — HEPATITIS B SURFACE ANTIGEN: Hepatitis B Surface Ag: NEGATIVE

## 2017-03-12 LAB — RPR: RPR: NONREACTIVE

## 2017-03-12 LAB — ANTIBODY SCREEN: Antibody Screen: NEGATIVE

## 2017-03-12 NOTE — Telephone Encounter (Signed)
Patient called with complaints of light pink spotting this am when she wiped after using the bathroom. States she has not had sex in a week. Informed patient that light spotting can be normal in the early part of pregnancy. Encouraged pelvic rest until the spotting stops, push fluids and continue to monitor bleeding. If it becomes heavier, to give [place] a call. Pt verbalized understanding.

## 2017-03-13 LAB — GC/CHLAMYDIA PROBE AMP
CHLAMYDIA, DNA PROBE: NEGATIVE
NEISSERIA GONORRHOEAE BY PCR: NEGATIVE

## 2017-03-13 LAB — URINE CULTURE: Organism ID, Bacteria: NO GROWTH

## 2017-03-19 ENCOUNTER — Encounter: Payer: Self-pay | Admitting: Obstetrics & Gynecology

## 2017-03-19 ENCOUNTER — Ambulatory Visit (INDEPENDENT_AMBULATORY_CARE_PROVIDER_SITE_OTHER): Payer: Medicaid Other | Admitting: Obstetrics & Gynecology

## 2017-03-19 VITALS — BP 102/70 | HR 74 | Wt 150.0 lb

## 2017-03-19 DIAGNOSIS — Z331 Pregnant state, incidental: Secondary | ICD-10-CM | POA: Diagnosis not present

## 2017-03-19 DIAGNOSIS — N760 Acute vaginitis: Secondary | ICD-10-CM

## 2017-03-19 DIAGNOSIS — B9689 Other specified bacterial agents as the cause of diseases classified elsewhere: Secondary | ICD-10-CM | POA: Diagnosis not present

## 2017-03-19 DIAGNOSIS — Z1389 Encounter for screening for other disorder: Secondary | ICD-10-CM | POA: Diagnosis not present

## 2017-03-19 LAB — POCT URINALYSIS DIPSTICK
Glucose, UA: NEGATIVE
KETONES UA: NEGATIVE
Leukocytes, UA: NEGATIVE
Nitrite, UA: NEGATIVE
PROTEIN UA: NEGATIVE
RBC UA: NEGATIVE

## 2017-03-19 NOTE — Progress Notes (Signed)
       Chief Complaint  Patient presents with  . Routine Prenatal Visit    vaginal discharge/ odor/ room# 12    Blood pressure 102/70, pulse 74, weight 150 lb (68 kg), last menstrual period 01/04/2017.  25 y.o. G2P1001 Patient's last menstrual period was 01/04/2017. The current method of family planning is pregnant.  Subjective Vaginal discharge for 1weeks Itching no Irritation yes Odor yes Similar to previous yes  Previous treatment metronidazole  Objective Vulva:  normal appearing vulva with no masses, tenderness or lesions Vagina:  normal mucosa, thin grey discharge Cervix:  no cervical motion tenderness and no lesions Uterus:  normal size, contour, position, consistency, mobility, non-tender Adnexa: ovaries:,    FHR 172   Pertinent ROS No burning with urination, frequency or urgency No nausea, vomiting or diarrhea Nor fever chills or other constitutional symptoms   Labs or studies Wet Prep:   A sample of vaginal discharge was obtained from the posterior fornix using a cotton swab. 2 drops of saline were placed on a slide and the cotton swab was immersed in the saline. Microscopic evaluation was performed and results were as follows:  Negative  for yeast Positive for clue cells , consistent with Bacterial vaginosis Negative for trichomonas  Normal WBC population   Whiff test: Negative     Impression Diagnoses this Encounter::   ICD-9-CM ICD-10-CM   1. BV (bacterial vaginosis) 616.10 N76.0    041.9 B96.89   2. Pregnant state, incidental V22.2 Z33.1 POCT urinalysis dipstick  3. Screening for genitourinary condition V81.6 Z13.89 POCT urinalysis dipstick    Established relevant diagnosis(es):   Plan/Recommendations: No orders of the defined types were placed in this encounter.   Labs or Scans Ordered: Orders Placed This Encounter  Procedures  . POCT urinalysis dipstick    Management:: No flagyl until 14 weeks  Follow up Return for keep  scheduled.          All questions were answered.

## 2017-04-08 ENCOUNTER — Encounter: Payer: Self-pay | Admitting: Women's Health

## 2017-04-08 ENCOUNTER — Ambulatory Visit (INDEPENDENT_AMBULATORY_CARE_PROVIDER_SITE_OTHER): Payer: Medicaid Other | Admitting: Women's Health

## 2017-04-08 VITALS — BP 106/64 | HR 79 | Wt 150.0 lb

## 2017-04-08 DIAGNOSIS — Z331 Pregnant state, incidental: Secondary | ICD-10-CM

## 2017-04-08 DIAGNOSIS — Z1389 Encounter for screening for other disorder: Secondary | ICD-10-CM

## 2017-04-08 DIAGNOSIS — Z3481 Encounter for supervision of other normal pregnancy, first trimester: Secondary | ICD-10-CM

## 2017-04-08 LAB — POCT URINALYSIS DIPSTICK
Blood, UA: NEGATIVE
Glucose, UA: NEGATIVE
KETONES UA: NEGATIVE
Leukocytes, UA: NEGATIVE
Nitrite, UA: NEGATIVE
PROTEIN UA: NEGATIVE

## 2017-04-08 MED ORDER — METRONIDAZOLE 500 MG PO TABS: 500.0000 mg | ORAL_TABLET | Freq: Two times a day (BID) | ORAL | 0 refills | Status: DC

## 2017-04-08 NOTE — Patient Instructions (Addendum)
Take metronidazole for BV at 14 weeks (5/31) Second Trimester of Pregnancy The second trimester is from week 14 through week 27 (months 4 through 6). The second trimester is often a time when you feel your best. Your body has adjusted to being pregnant, and you begin to feel better physically. Usually, morning sickness has lessened or quit completely, you may have more energy, and you may have an increase in appetite. The second trimester is also a time when the fetus is growing rapidly. At the end of the sixth month, the fetus is about 9 inches long and weighs about 1 pounds. You will likely begin to feel the baby move (quickening) between 16 and 20 weeks of pregnancy. Body changes during your second trimester Your body continues to go through many changes during your second trimester. The changes vary from woman to woman.  Your weight will continue to increase. You will notice your lower abdomen bulging out.  You may begin to get stretch marks on your hips, abdomen, and breasts.  You may develop headaches that can be relieved by medicines. The medicines should be approved by your health care provider.  You may urinate more often because the fetus is pressing on your bladder.  You may develop or continue to have heartburn as a result of your pregnancy.  You may develop constipation because certain hormones are causing the muscles that push waste through your intestines to slow down.  You may develop hemorrhoids or swollen, bulging veins (varicose veins).  You may have back pain. This is caused by:  Weight gain.  Pregnancy hormones that are relaxing the joints in your pelvis.  A shift in weight and the muscles that support your balance.  Your breasts will continue to grow and they will continue to become tender.  Your gums may bleed and may be sensitive to brushing and flossing.  Dark spots or blotches (chloasma, mask of pregnancy) may develop on your face. This will likely fade  after the baby is born.  A dark line from your belly button to the pubic area (linea nigra) may appear. This will likely fade after the baby is born.  You may have changes in your hair. These can include thickening of your hair, rapid growth, and changes in texture. Some women also have hair loss during or after pregnancy, or hair that feels dry or thin. Your hair will most likely return to normal after your baby is born. What to expect at prenatal visits During a routine prenatal visit:  You will be weighed to make sure you and the fetus are growing normally.  Your blood pressure will be taken.  Your abdomen will be measured to track your baby's growth.  The fetal heartbeat will be listened to.  Any test results from the previous visit will be discussed. Your health care provider may ask you:  How you are feeling.  If you are feeling the baby move.  If you have had any abnormal symptoms, such as leaking fluid, bleeding, severe headaches, or abdominal cramping.  If you are using any tobacco products, including cigarettes, chewing tobacco, and electronic cigarettes.  If you have any questions. Other tests that may be performed during your second trimester include:  Blood tests that check for:  Low iron levels (anemia).  High blood sugar that affects pregnant women (gestational diabetes) between 8824 and 28 weeks.  Rh antibodies. This is to check for a protein on red blood cells (Rh factor).  Urine tests to  check for infections, diabetes, or protein in the urine.  An ultrasound to confirm the proper growth and development of the baby.  An amniocentesis to check for possible genetic problems.  Fetal screens for spina bifida and Down syndrome.  HIV (human immunodeficiency virus) testing. Routine prenatal testing includes screening for HIV, unless you choose not to have this test. Follow these instructions at home: Medicines   Follow your health care provider's instructions  regarding medicine use. Specific medicines may be either safe or unsafe to take during pregnancy.  Take a prenatal vitamin that contains at least 600 micrograms (mcg) of folic acid.  If you develop constipation, try taking a stool softener if your health care provider approves. Eating and drinking   Eat a balanced diet that includes fresh fruits and vegetables, whole grains, good sources of protein such as meat, eggs, or tofu, and low-fat dairy. Your health care provider will help you determine the amount of weight gain that is right for you.  Avoid raw meat and uncooked cheese. These carry germs that can cause birth defects in the baby.  If you have low calcium intake from food, talk to your health care provider about whether you should take a daily calcium supplement.  Limit foods that are high in fat and processed sugars, such as fried and sweet foods.  To prevent constipation:  Drink enough fluid to keep your urine clear or pale yellow.  Eat foods that are high in fiber, such as fresh fruits and vegetables, whole grains, and beans. Activity   Exercise only as directed by your health care provider. Most women can continue their usual exercise routine during pregnancy. Try to exercise for 30 minutes at least 5 days a week. Stop exercising if you experience uterine contractions.  Avoid heavy lifting, wear low heel shoes, and practice good posture.  A sexual relationship may be continued unless your health care provider directs you otherwise. Relieving pain and discomfort   Wear a good support bra to prevent discomfort from breast tenderness.  Take warm sitz baths to soothe any pain or discomfort caused by hemorrhoids. Use hemorrhoid cream if your health care provider approves.  Rest with your legs elevated if you have leg cramps or low back pain.  If you develop varicose veins, wear support hose. Elevate your feet for 15 minutes, 3-4 times a day. Limit salt in your diet. Prenatal  Care   Write down your questions. Take them to your prenatal visits.  Keep all your prenatal visits as told by your health care provider. This is important. Safety   Wear your seat belt at all times when driving.  Make a list of emergency phone numbers, including numbers for family, friends, the hospital, and police and fire departments. General instructions   Ask your health care provider for a referral to a local prenatal education class. Begin classes no later than the beginning of month 6 of your pregnancy.  Ask for help if you have counseling or nutritional needs during pregnancy. Your health care provider can offer advice or refer you to specialists for help with various needs.  Do not use hot tubs, steam rooms, or saunas.  Do not douche or use tampons or scented sanitary pads.  Do not cross your legs for long periods of time.  Avoid cat litter boxes and soil used by cats. These carry germs that can cause birth defects in the baby and possibly loss of the fetus by miscarriage or stillbirth.  Avoid all  smoking, herbs, alcohol, and unprescribed drugs. Chemicals in these products can affect the formation and growth of the baby.  Do not use any products that contain nicotine or tobacco, such as cigarettes and e-cigarettes. If you need help quitting, ask your health care provider.  Visit your dentist if you have not gone yet during your pregnancy. Use a soft toothbrush to brush your teeth and be gentle when you floss. Contact a health care provider if:  You have dizziness.  You have mild pelvic cramps, pelvic pressure, or nagging pain in the abdominal area.  You have persistent nausea, vomiting, or diarrhea.  You have a bad smelling vaginal discharge.  You have pain when you urinate. Get help right away if:  You have a fever.  You are leaking fluid from your vagina.  You have spotting or bleeding from your vagina.  You have severe abdominal cramping or pain.  You  have rapid weight gain or weight loss.  You have shortness of breath with chest pain.  You notice sudden or extreme swelling of your face, hands, ankles, feet, or legs.  You have not felt your baby move in over an hour.  You have severe headaches that do not go away when you take medicine.  You have vision changes. Summary  The second trimester is from week 14 through week 27 (months 4 through 6). It is also a time when the fetus is growing rapidly.  Your body goes through many changes during pregnancy. The changes vary from woman to woman.  Avoid all smoking, herbs, alcohol, and unprescribed drugs. These chemicals affect the formation and growth your baby.  Do not use any tobacco products, such as cigarettes, chewing tobacco, and e-cigarettes. If you need help quitting, ask your health care provider.  Contact your health care provider if you have any questions. Keep all prenatal visits as told by your health care provider. This is important. This information is not intended to replace advice given to you by your health care provider. Make sure you discuss any questions you have with your health care provider. Document Released: 10/30/2001 Document Revised: 04/12/2016 Document Reviewed: 01/06/2013 Elsevier Interactive Patient Education  2017 [employer].

## 2017-04-08 NOTE — Progress Notes (Signed)
Low-risk OB appointment G2P1001 5122w4d Estimated Date of Delivery: 10/17/17 BP 106/64   Pulse 79   Wt 150 lb (68 kg)   LMP 01/04/2017   BMI 23.49 kg/m   BP, weight, and urine reviewed.  Refer to obstetrical flow sheet for FH & FHR.  No fm yet. Denies cramping, lof, vb, or uti s/s. Still has BV that was dx 5/1, was told to wait until 14wks for metronidazole, doesn't have rx. Rx sent today, to begin at 14wks if sx still present.  Back of both calves hurt- look normal, neg Homan's, no varicose veins. Works 40hr/wk, to elevate/rest legs prn. Dizzy in shower- make sure water not too hot.  Reviewed warning s/s to report. Plan:  Continue routine obstetrical care  F/U in 3wks for OB appointment  Declines genetic screening

## 2017-04-29 ENCOUNTER — Ambulatory Visit (INDEPENDENT_AMBULATORY_CARE_PROVIDER_SITE_OTHER): Payer: Medicaid Other | Admitting: Obstetrics & Gynecology

## 2017-04-29 ENCOUNTER — Encounter: Payer: Self-pay | Admitting: Obstetrics & Gynecology

## 2017-04-29 VITALS — BP 126/82 | HR 75 | Wt 152.0 lb

## 2017-04-29 DIAGNOSIS — Z1389 Encounter for screening for other disorder: Secondary | ICD-10-CM

## 2017-04-29 DIAGNOSIS — Z331 Pregnant state, incidental: Secondary | ICD-10-CM

## 2017-04-29 DIAGNOSIS — Z3482 Encounter for supervision of other normal pregnancy, second trimester: Secondary | ICD-10-CM

## 2017-04-29 LAB — POCT URINALYSIS DIPSTICK
Blood, UA: NEGATIVE
GLUCOSE UA: NEGATIVE
KETONES UA: NEGATIVE
LEUKOCYTES UA: NEGATIVE
NITRITE UA: NEGATIVE
Protein, UA: NEGATIVE

## 2017-04-29 MED ORDER — OMEPRAZOLE 20 MG PO CPDR: 20.0000 mg | DELAYED_RELEASE_CAPSULE | Freq: Every day | ORAL | 6 refills | Status: DC

## 2017-04-29 NOTE — Progress Notes (Signed)
G2P1001 5029w4d Estimated Date of Delivery: 10/17/17  Blood pressure 126/82, pulse 75, weight 152 lb (68.9 kg), last menstrual period 01/04/2017.   BP weight and urine results all reviewed and noted.  Please refer to the obstetrical flow sheet for the fundal height and fetal heart rate documentation:  Patient reports good fetal movement, denies any bleeding and no rupture of membranes symptoms or regular contractions. Patient is without complaints. All questions were answered.  Orders Placed This Encounter  Procedures  . US OB Comp + 14 Wk  . POCT urinalysis dipstick   Meds ordered this encounter  Medications  . omeprazole (PRILOSEC) 20 MG capsule    Sig: Take 1 capsule (20 mg total) by mouth daily. 1 tablet a day    Dispense:  30 capsule    Refill:  6     Plan:  Continued routine obstetrical care, declined integrated screening  Return in about 4 weeks (around 05/27/2017) for 20 week sono, LROB.

## 2017-05-01 ENCOUNTER — Telehealth: Payer: Self-pay | Admitting: Obstetrics & Gynecology

## 2017-05-01 NOTE — Telephone Encounter (Signed)
Pt called back and I made appt for pt

## 2017-05-01 NOTE — Telephone Encounter (Signed)
Patient states she is having vaginal itching, no discharge or odor. Hasn't started Flagyl for BV but is pretty sure it is yeast. Spoke to [employer] who states she can take the Flagyl or get OTC Monistat 7 or keep her w/i appt. States she will come in to be seen.

## 2017-05-01 NOTE — Telephone Encounter (Signed)
Patient would like an rx for a yeast infection sent to [facility], [place]

## 2017-05-02 ENCOUNTER — Encounter: Payer: Self-pay | Admitting: Obstetrics and Gynecology

## 2017-05-02 ENCOUNTER — Ambulatory Visit (INDEPENDENT_AMBULATORY_CARE_PROVIDER_SITE_OTHER): Payer: Medicaid Other | Admitting: Obstetrics and Gynecology

## 2017-05-02 VITALS — BP 100/52 | HR 75 | Wt 152.0 lb

## 2017-05-02 DIAGNOSIS — B3731 Acute candidiasis of vulva and vagina: Secondary | ICD-10-CM | POA: Insufficient documentation

## 2017-05-02 DIAGNOSIS — Z3482 Encounter for supervision of other normal pregnancy, second trimester: Secondary | ICD-10-CM

## 2017-05-02 DIAGNOSIS — Z1389 Encounter for screening for other disorder: Secondary | ICD-10-CM

## 2017-05-02 DIAGNOSIS — B373 Candidiasis of vulva and vagina: Secondary | ICD-10-CM

## 2017-05-02 DIAGNOSIS — Z331 Pregnant state, incidental: Secondary | ICD-10-CM

## 2017-05-02 LAB — POCT WET PREP WITH KOH: Trichomonas, UA: NEGATIVE

## 2017-05-02 LAB — POCT URINALYSIS DIPSTICK
Blood, UA: NEGATIVE
Glucose, UA: NEGATIVE
KETONES UA: NEGATIVE
NITRITE UA: NEGATIVE
PROTEIN UA: NEGATIVE

## 2017-05-02 MED ORDER — MICONAZOLE NITRATE 2 % VA CREA: 1.0000 | TOPICAL_CREAM | Freq: Every day | VAGINAL | Status: DC

## 2017-05-02 MED ORDER — FLUCONAZOLE 150 MG PO TABS: 150.0000 mg | ORAL_TABLET | Freq: Once | ORAL | 1 refills | Status: AC

## 2017-05-02 NOTE — Progress Notes (Signed)
Patient ID: Tiffany Goodwin, female   DOB: 02/26/1991, 26 y.o.   MRN: [id number]  [id number]  Estimated Date of Delivery: 10/17/17 LROB 319w0d  Chief Complaint  Patient presents with  . Vaginitis  ____  Patient complaints: Vaginal discharge and itching. She states she started medication for BV yesterday, but now feels that she has a yeast infection.   Patient reports good fetal movement. She denies any bleeding, rupture of membranes,or regular contractions.  Blood pressure (!) 100/52, pulse 75, weight 152 lb (68.9 kg), last menstrual period 01/04/2017.   Urine results:notable for +2 leukocytes  refer to the ob flow sheet for FH and FHR, ,                          Physical Examination: General appearance - alert, well appearing, and in no distress                                      Abdomen -FHR 150 bpm                                                         soft, nontender, nondistended, no masses or organomegaly                                      Pelvic - VULVA: multiple punctate lesions around the vulva      VAGINA: normal appearing vagina with normal color, no lesions, small amt of white clumpy discharge   KOH positive   Wet prep: mod WBC and yeast. No clue or tric                                             Questions were answered. Assessment: LROB G2P1001 @ 3919w0d  Monilial vulvovaginitis   Plan:  Continued routine obstetrical care Rx Diflucan. Recommended Monistat OTC  F/u in 4 weeks for routine prenatal care   By signing my name below, I, Doreatha MartinEva Mathews, attest that this documentation has been prepared under the direction and in the presence of Tilda BurrowFerguson, Ellenore Roscoe V, MD. Electronically Signed: Doreatha MartinEva Mathews, ED Scribe. 05/02/17. 12:16 PM.  I personally performed the services described in this documentation, which was SCRIBED in my presence. The recorded information has been reviewed and considered accurate. It has been edited as necessary during review. Tilda BurrowFERGUSON,Sherwin Hollingshed V, MD

## 2017-05-24 ENCOUNTER — Other Ambulatory Visit: Payer: Self-pay | Admitting: Obstetrics & Gynecology

## 2017-05-24 DIAGNOSIS — Z363 Encounter for antenatal screening for malformations: Secondary | ICD-10-CM

## 2017-05-27 ENCOUNTER — Encounter: Payer: Self-pay | Admitting: Women's Health

## 2017-05-27 ENCOUNTER — Ambulatory Visit (INDEPENDENT_AMBULATORY_CARE_PROVIDER_SITE_OTHER): Payer: Medicaid Other

## 2017-05-27 ENCOUNTER — Ambulatory Visit (INDEPENDENT_AMBULATORY_CARE_PROVIDER_SITE_OTHER): Payer: Medicaid Other | Admitting: Women's Health

## 2017-05-27 VITALS — BP 92/50 | HR 70 | Wt 153.5 lb

## 2017-05-27 DIAGNOSIS — Z363 Encounter for antenatal screening for malformations: Secondary | ICD-10-CM | POA: Diagnosis not present

## 2017-05-27 DIAGNOSIS — Z1389 Encounter for screening for other disorder: Secondary | ICD-10-CM

## 2017-05-27 DIAGNOSIS — Z331 Pregnant state, incidental: Secondary | ICD-10-CM

## 2017-05-27 DIAGNOSIS — Z3482 Encounter for supervision of other normal pregnancy, second trimester: Secondary | ICD-10-CM

## 2017-05-27 DIAGNOSIS — Z3402 Encounter for supervision of normal first pregnancy, second trimester: Secondary | ICD-10-CM

## 2017-05-27 DIAGNOSIS — Z3A19 19 weeks gestation of pregnancy: Secondary | ICD-10-CM

## 2017-05-27 LAB — POCT URINALYSIS DIPSTICK
Blood, UA: NEGATIVE
Glucose, UA: NEGATIVE
KETONES UA: NEGATIVE
NITRITE UA: NEGATIVE
PROTEIN UA: NEGATIVE

## 2017-05-27 NOTE — Patient Instructions (Signed)

## 2017-05-27 NOTE — Progress Notes (Signed)
Low-risk OB appointment G2P1001 1659w4d Estimated Date of Delivery: 10/17/17 BP (!) 92/50   Pulse 70   Wt 153 lb 8 oz (69.6 kg)   LMP 01/04/2017   BMI 24.04 kg/m   BP, weight, and urine reviewed.  Refer to obstetrical flow sheet for FH & FHR.  Reports good fm.  Denies regular uc's, lof, vb, or uti s/s. Numbness bilateral hands c/w CTS- try wrist splints.  Reviewed today's normal anatomy u/s, warning s/s to report. Plan:  Continue routine obstetrical care  F/U in 4wks for OB appointment

## 2017-05-27 NOTE — Progress Notes (Signed)
US 19+4 wks,cephalic,post pl gr 0,normal ovaries bilat,cx 4.7 cm,fhr 136 bpm,svp of fluid 4.1 cm,efw 303 g,anatomy complete,no obvious abnormalities seen

## 2017-06-25 ENCOUNTER — Encounter: Payer: Self-pay | Admitting: Women's Health

## 2017-06-25 ENCOUNTER — Ambulatory Visit (INDEPENDENT_AMBULATORY_CARE_PROVIDER_SITE_OTHER): Payer: Medicaid Other | Admitting: Women's Health

## 2017-06-25 ENCOUNTER — Encounter (INDEPENDENT_AMBULATORY_CARE_PROVIDER_SITE_OTHER): Payer: Self-pay

## 2017-06-25 VITALS — BP 96/50 | HR 73 | Wt 158.0 lb

## 2017-06-25 DIAGNOSIS — Z331 Pregnant state, incidental: Secondary | ICD-10-CM

## 2017-06-25 DIAGNOSIS — O09892 Supervision of other high risk pregnancies, second trimester: Secondary | ICD-10-CM

## 2017-06-25 DIAGNOSIS — Z3A23 23 weeks gestation of pregnancy: Secondary | ICD-10-CM

## 2017-06-25 DIAGNOSIS — O2202 Varicose veins of lower extremity in pregnancy, second trimester: Secondary | ICD-10-CM

## 2017-06-25 DIAGNOSIS — Z3482 Encounter for supervision of other normal pregnancy, second trimester: Secondary | ICD-10-CM

## 2017-06-25 DIAGNOSIS — Z1389 Encounter for screening for other disorder: Secondary | ICD-10-CM

## 2017-06-25 LAB — POCT URINALYSIS DIPSTICK
Blood, UA: NEGATIVE
GLUCOSE UA: NEGATIVE
Ketones, UA: NEGATIVE
LEUKOCYTES UA: NEGATIVE
NITRITE UA: NEGATIVE
Protein, UA: NEGATIVE

## 2017-06-25 NOTE — Patient Instructions (Signed)
You will have your sugar test next visit.  Please do not eat or drink anything after midnight the night before you come, not even water.  You will be here for at least two hours.     Call the office ([phone]) or go to [facility] if:  You begin to have strong, frequent contractions  Your water breaks.  Sometimes it is a big gush of fluid, sometimes it is just a trickle that keeps getting your panties wet or running down your legs  You have vaginal bleeding.  It is normal to have a small amount of spotting if your cervix was checked.   You don't feel your baby moving like normal.  If you don't, get you something to eat and drink and lay down and focus on feeling your baby move.   If your baby is still not moving like normal, you should call the office or go to [facility].  Second Trimester of Pregnancy The second trimester is from week 13 through week 28, months 4 through 6. The second trimester is often a time when you feel your best. Your body has also adjusted to being pregnant, and you begin to feel better physically. Usually, morning sickness has lessened or quit completely, you may have more energy, and you may have an increase in appetite. The second trimester is also a time when the fetus is growing rapidly. At the end of the sixth month, the fetus is about 9 inches long and weighs about 1 pounds. You will likely begin to feel the baby move (quickening) between 18 and 20 weeks of the pregnancy. BODY CHANGES Your body goes through many changes during pregnancy. The changes vary from woman to woman.   Your weight will continue to increase. You will notice your lower abdomen bulging out.  You may begin to get stretch marks on your hips, abdomen, and breasts.  You may develop headaches that can be relieved by medicines approved by your health care provider.  You may urinate more often because the fetus is pressing on your bladder.  You may develop or continue to have  heartburn as a result of your pregnancy.  You may develop constipation because certain hormones are causing the muscles that push waste through your intestines to slow down.  You may develop hemorrhoids or swollen, bulging veins (varicose veins).  You may have back pain because of the weight gain and pregnancy hormones relaxing your joints between the bones in your pelvis and as a result of a shift in weight and the muscles that support your balance.  Your breasts will continue to grow and be tender.  Your gums may bleed and may be sensitive to brushing and flossing.  Dark spots or blotches (chloasma, mask of pregnancy) may develop on your face. This will likely fade after the baby is born.  A dark line from your belly button to the pubic area (linea nigra) may appear. This will likely fade after the baby is born.  You may have changes in your hair. These can include thickening of your hair, rapid growth, and changes in texture. Some women also have hair loss during or after pregnancy, or hair that feels dry or thin. Your hair will most likely return to normal after your baby is born. WHAT TO EXPECT AT YOUR PRENATAL VISITS During a routine prenatal visit:  You will be weighed to make sure you and the fetus are growing normally.  Your blood pressure will be taken.    Your abdomen will be measured to track your baby's growth.  The fetal heartbeat will be listened to.  Any test results from the previous visit will be discussed. Your health care provider may ask you:  How you are feeling.  If you are feeling the baby move.  If you have had any abnormal symptoms, such as leaking fluid, bleeding, severe headaches, or abdominal cramping.  If you have any questions. Other tests that may be performed during your second trimester include:  Blood tests that check for:  Low iron levels (anemia).  Gestational diabetes (between 24 and 28 weeks).  Rh antibodies.  Urine tests to check  for infections, diabetes, or protein in the urine.  An ultrasound to confirm the proper growth and development of the baby.  An amniocentesis to check for possible genetic problems.  Fetal screens for spina bifida and Down syndrome. HOME CARE INSTRUCTIONS   Avoid all smoking, herbs, alcohol, and unprescribed drugs. These chemicals affect the formation and growth of the baby.  Follow your health care provider's instructions regarding medicine use. There are medicines that are either safe or unsafe to take during pregnancy.  Exercise only as directed by your health care provider. Experiencing uterine cramps is a good sign to stop exercising.  Continue to eat regular, healthy meals.  Wear a good support bra for breast tenderness.  Do not use hot tubs, steam rooms, or saunas.  Wear your seat belt at all times when driving.  Avoid raw meat, uncooked cheese, cat litter boxes, and soil used by cats. These carry germs that can cause birth defects in the baby.  Take your prenatal vitamins.  Try taking a stool softener (if your health care provider approves) if you develop constipation. Eat more high-fiber foods, such as fresh vegetables or fruit and whole grains. Drink plenty of fluids to keep your urine clear or pale yellow.  Take warm sitz baths to soothe any pain or discomfort caused by hemorrhoids. Use hemorrhoid cream if your health care provider approves.  If you develop varicose veins, wear support hose. Elevate your feet for 15 minutes, 3-4 times a day. Limit salt in your diet.  Avoid heavy lifting, wear low heel shoes, and practice good posture.  Rest with your legs elevated if you have leg cramps or low back pain.  Visit your dentist if you have not gone yet during your pregnancy. Use a soft toothbrush to brush your teeth and be gentle when you floss.  A sexual relationship may be continued unless your health care provider directs you otherwise.  Continue to go to all your  prenatal visits as directed by your health care provider. SEEK MEDICAL CARE IF:   You have dizziness.  You have mild pelvic cramps, pelvic pressure, or nagging pain in the abdominal area.  You have persistent nausea, vomiting, or diarrhea.  You have a bad smelling vaginal discharge.  You have pain with urination. SEEK IMMEDIATE MEDICAL CARE IF:   You have a fever.  You are leaking fluid from your vagina.  You have spotting or bleeding from your vagina.  You have severe abdominal cramping or pain.  You have rapid weight gain or loss.  You have shortness of breath with chest pain.  You notice sudden or extreme swelling of your face, hands, ankles, feet, or legs.  You have not felt your baby move in over an hour.  You have severe headaches that do not go away with medicine.  You have vision changes.   Document Released: 10/30/2001 Document Revised: 11/10/2013 Document Reviewed: 01/06/2013 Exit[employer] Patient Information 2015 [employer]. This information is not intended to replace advice given to you by your health care provider. Make sure you discuss any questions you have with your health care provider.     

## 2017-06-25 NOTE — Progress Notes (Signed)
Low-risk OB appointment G2P1001 1732w5d Estimated Date of Delivery: 10/17/17 BP (!) 96/50   Pulse 73   Wt 158 lb (71.7 kg)   LMP 01/04/2017   BMI 24.75 kg/m   BP, weight, and urine reviewed.  Refer to obstetrical flow sheet for FH & FHR.  Reports good fm.  Denies regular uc's, lof, vb, or uti s/s. Intermittent pressure. Lt arm/wrist numb at night and some during day- try wrist splint. Varicose veins bilateral lower legs- get bilateral thigh-high compression stockings form [facility]. Still getting on ladder at work to 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 Let us know if needs note.  Reviewed ptl s/s, fm. Plan:  Continue routine obstetrical care  F/U in 4wks for OB appointment and pn2

## 2017-07-23 ENCOUNTER — Encounter: Payer: Self-pay | Admitting: Women's Health

## 2017-07-23 ENCOUNTER — Ambulatory Visit (INDEPENDENT_AMBULATORY_CARE_PROVIDER_SITE_OTHER): Payer: Medicaid Other | Admitting: Women's Health

## 2017-07-23 ENCOUNTER — Other Ambulatory Visit: Payer: Medicaid Other

## 2017-07-23 VITALS — BP 106/52 | HR 76 | Wt 160.5 lb

## 2017-07-23 DIAGNOSIS — Z131 Encounter for screening for diabetes mellitus: Secondary | ICD-10-CM

## 2017-07-23 DIAGNOSIS — Z3482 Encounter for supervision of other normal pregnancy, second trimester: Secondary | ICD-10-CM

## 2017-07-23 DIAGNOSIS — Z1389 Encounter for screening for other disorder: Secondary | ICD-10-CM

## 2017-07-23 DIAGNOSIS — Z3A27 27 weeks gestation of pregnancy: Secondary | ICD-10-CM

## 2017-07-23 DIAGNOSIS — Z331 Pregnant state, incidental: Secondary | ICD-10-CM

## 2017-07-23 LAB — POCT URINALYSIS DIPSTICK
Blood, UA: NEGATIVE
Glucose, UA: NEGATIVE
KETONES UA: NEGATIVE
Leukocytes, UA: NEGATIVE
Nitrite, UA: NEGATIVE
PROTEIN UA: NEGATIVE

## 2017-07-23 NOTE — Progress Notes (Signed)
Low-risk OB appointment G2P1001 4469w5d Estimated Date of Delivery: 10/17/17 BP (!) 106/52   Pulse 76   Wt 160 lb 8 oz (72.8 kg)   LMP 01/04/2017   BMI 25.14 kg/m   BP, weight, and urine reviewed.  Refer to obstetrical flow sheet for FH & FHR.  Reports good fm.  Denies regular uc's, lof, vb, or uti s/s. No complaints. Reviewed ptl s/s, fkc. Recommended Tdap at HD/PCP per CDC guidelines.  Plan:  Continue routine obstetrical care  F/U in 4wks for OB appointment  PN2 today

## 2017-07-23 NOTE — Patient Instructions (Signed)

## 2017-07-24 ENCOUNTER — Telehealth: Payer: Self-pay | Admitting: Advanced Practice Midwife

## 2017-07-24 LAB — CBC
HEMOGLOBIN: 11.3 g/dL (ref 11.1–15.9)
Hematocrit: 35.2 % (ref 34.0–46.6)
MCH: 31.2 pg (ref 26.6–33.0)
MCHC: 32.1 g/dL (ref 31.5–35.7)
MCV: 97 fL (ref 79–97)
PLATELETS: 287 10*3/uL (ref 150–379)
RBC: 3.62 x10E6/uL — ABNORMAL LOW (ref 3.77–5.28)
RDW: 13.2 % (ref 12.3–15.4)
WBC: 11 10*3/uL — ABNORMAL HIGH (ref 3.4–10.8)

## 2017-07-24 LAB — RPR: RPR Ser Ql: NONREACTIVE

## 2017-07-24 LAB — GLUCOSE TOLERANCE, 2 HOURS W/ 1HR
GLUCOSE, 1 HOUR: 107 mg/dL (ref 65–179)
GLUCOSE, FASTING: 86 mg/dL (ref 65–91)
Glucose, 2 hour: 94 mg/dL (ref 65–152)

## 2017-07-24 LAB — ANTIBODY SCREEN: Antibody Screen: NEGATIVE

## 2017-07-24 LAB — HIV ANTIBODY (ROUTINE TESTING W REFLEX): HIV Screen 4th Generation wRfx: NONREACTIVE

## 2017-07-24 NOTE — Telephone Encounter (Signed)
Spoke with pt. Pt is requesting a note stating she can't go up and down a ladder. Advised we can get letter ready for her tomorrow. Pt voiced understanding. JSY

## 2017-07-25 NOTE — Telephone Encounter (Signed)
Left message letting pt know note she requested is ready at front desk. JSY

## 2017-08-05 ENCOUNTER — Telehealth: Payer: Self-pay | Admitting: *Deleted

## 2017-08-05 NOTE — Telephone Encounter (Signed)
Patient called with complaints of a cough for the past couple of days. Advised she could use cough drops, guaifenesin or Robitussin and to push fluids. Verbalized understanding.

## 2017-08-07 ENCOUNTER — Telehealth: Payer: Self-pay | Admitting: Women's Health

## 2017-08-07 NOTE — Telephone Encounter (Signed)
LMOVM that sugar test was normal so no further testing was needed. Advised to call back if she had further questions.

## 2017-08-07 NOTE — Telephone Encounter (Signed)
Patient called stating that she would like a call back from the nurse regarding her sugar test results. Please contact pt

## 2017-08-08 ENCOUNTER — Encounter: Payer: Self-pay | Admitting: Obstetrics and Gynecology

## 2017-08-08 ENCOUNTER — Ambulatory Visit (INDEPENDENT_AMBULATORY_CARE_PROVIDER_SITE_OTHER): Payer: Medicaid Other | Admitting: Obstetrics and Gynecology

## 2017-08-08 VITALS — BP 112/60 | HR 80 | Wt 163.6 lb

## 2017-08-08 DIAGNOSIS — Z1389 Encounter for screening for other disorder: Secondary | ICD-10-CM

## 2017-08-08 DIAGNOSIS — Z3A30 30 weeks gestation of pregnancy: Secondary | ICD-10-CM

## 2017-08-08 DIAGNOSIS — Z331 Pregnant state, incidental: Secondary | ICD-10-CM

## 2017-08-08 DIAGNOSIS — Z3482 Encounter for supervision of other normal pregnancy, second trimester: Secondary | ICD-10-CM

## 2017-08-08 LAB — POCT URINALYSIS DIPSTICK
Blood, UA: NEGATIVE
Glucose, UA: NEGATIVE
KETONES UA: NEGATIVE
LEUKOCYTES UA: NEGATIVE
NITRITE UA: NEGATIVE
Protein, UA: NEGATIVE

## 2017-08-08 MED ORDER — HYDROCODONE-HOMATROPINE 5-1.5 MG/5ML PO SYRP: 5.0000 mL | ORAL_SOLUTION | Freq: Four times a day (QID) | ORAL | 0 refills | Status: DC | PRN

## 2017-08-08 NOTE — Addendum Note (Signed)
Addended by: [name] on: 08/08/2017 03:23 PM   Modules accepted: Orders

## 2017-08-08 NOTE — Progress Notes (Addendum)
Patient ID: Tiffany Goodwin, female   DOB: December 19, 1990, 26 y.o.   MRN: [id number]  Estimated Date of Delivery: 10/17/17 LROB 30w0 rworkin appt  Chief Complaint  Patient presents with  . Routine Prenatal Visit    bright red bleeding last night x1   ____Noted after wiping from urination noticed on all 3 tissues with no clots and nothing per vagina later nothing on panties  Patient complaints: Pt reports bright red bleeding last night after wiping. Endorses mild abdominal cramping, and coughing (1 week). She has tried robitussin DM with mild relief. Her cough is worse at night. Doesn't smoke. Denies fever, chills and dysuria.    Patient reports good fetal movement, denies any bleeding, rupture of membranes,or regular contractions.  Blood pressure 112/60, pulse 80, weight 163 lb 9.6 oz (74.2 kg), last menstrual period 01/04/2017.   Urine results:notable for negative refer to the ob flow sheet for FH and FHR, ,                          Physical Examination: General appearance - alert, well appearing, and in no distress and oriented to person, place, and time                                      Abdomen - FH 32 cm,                                                         -FHR 153                                       Pelvic - normal external genitalia, vulva, vagina, cervix, uterus and adnexa,  VAGINA: no blood,  CERVIX: long, closed high                                            Questions were answered. Assessment: LROB G2P1001 @ [redacted]w[redacted]d  1. URI 2. Periurethral bleeding  Plan:  Continued routine obstetrical care, reassured regarding urethra 1. Rx Hycodan  5 ml q 8  F/u in 2 weeks for LROB  By signing my name below, I, Diona Browner, attest that this documentation has been prepared under the direction and in the presence of [name], MD. Electronically Signed: Diona Browner, Medical Scribe. 08/08/17. 3:00 PM.  I personally performed the services described in this  documentation, which was SCRIBED in my presence. The recorded information has been reviewed and considered accurate. It has been edited as necessary during review. [name], MD

## 2017-08-20 ENCOUNTER — Encounter: Payer: Medicaid Other | Admitting: Women's Health

## 2017-08-21 ENCOUNTER — Encounter: Payer: Self-pay | Admitting: Women's Health

## 2017-08-21 ENCOUNTER — Ambulatory Visit (INDEPENDENT_AMBULATORY_CARE_PROVIDER_SITE_OTHER): Payer: Medicaid Other | Admitting: Women's Health

## 2017-08-21 VITALS — BP 108/54 | HR 82 | Wt 162.0 lb

## 2017-08-21 DIAGNOSIS — Z3483 Encounter for supervision of other normal pregnancy, third trimester: Secondary | ICD-10-CM

## 2017-08-21 DIAGNOSIS — Z3A31 31 weeks gestation of pregnancy: Secondary | ICD-10-CM

## 2017-08-21 DIAGNOSIS — Z331 Pregnant state, incidental: Secondary | ICD-10-CM

## 2017-08-21 DIAGNOSIS — Z1389 Encounter for screening for other disorder: Secondary | ICD-10-CM

## 2017-08-21 LAB — POCT URINALYSIS DIPSTICK
Glucose, UA: NEGATIVE
LEUKOCYTES UA: NEGATIVE
NITRITE UA: NEGATIVE
Protein, UA: NEGATIVE
RBC UA: NEGATIVE

## 2017-08-21 NOTE — Progress Notes (Signed)
   [facility] Low-Risk Pregnancy Visit  Patient name: Tiffany Goodwin MRN [id number]  Date of birth: 12/09/1990 CC & HPI:  Tiffany Goodwin is a 26 y.o. G2P1001 female at [redacted]w[redacted]d with an Estimated Date of Delivery: 10/17/17 being seen today for ongoing management of a low-risk pregnancy.  Today she reports cough x 1 mth, non-productive, no fever/chills or any other sx, doesn't smoke. No cp, sob. Has tried hycodan which doesn't help Review of Systems:   Reports good fm.   Denies regular contractions, leakage of fluid, vaginal bleeding, abnormal vaginal discharge w/ itching/odor/irritation, headaches, visual changes, shortness of breath, chest pain, abdominal pain, severe nausea/vomiting, or problems with urination or bowel movements unless otherwise stated above. Pertinent History Reviewed:  Reviewed past medical,surgical, social and family history.  Reviewed problem list, medications and allergies. Objective Findings:   Vitals:   08/21/17 1435  BP: (!) 108/54  Pulse: 82  Weight: 162 lb (73.5 kg)    Body mass index is 25.37 kg/m.  Fundal height: 33 Fetal heart rate: 135 SVE: n/a  Results for orders placed or performed in visit on 08/21/17 (from the past 24 hour(s))  POCT Urinalysis Dipstick   Collection Time: 08/21/17  2:44 PM  Result Value Ref Range   Color, UA     Clarity, UA     Glucose, UA neg    Bilirubin, UA     Ketones, UA large    Spec Grav, UA  1.010 - 1.025   Blood, UA neg    pH, UA  5.0 - 8.0   Protein, UA neg    Urobilinogen, UA  0.2 or 1.0 E.U./dL   Nitrite, UA neg    Leukocytes, UA Negative Negative    Assessment & Plan:  1) Low-risk pregnancy G2P1001 at [redacted]w[redacted]d with an Estimated Date of Delivery: 10/17/17  2) Non-productive cough> discussed and gave printed relief measures  Declined flu shot today Reviewed: normal pn2 results, ptl s/s, fkc. All questions were answered Plan:  Continue routine obstetrical care   Return in about 2 weeks (around 09/04/2017)  for LROB.  Orders Placed This Encounter  Procedures  . POCT Urinalysis Dipstick   Marge Duncans CNM, [facility] 08/21/2017 3:07 PM

## 2017-08-21 NOTE — Patient Instructions (Addendum)
Humidifier and saline nasal spray for nasal congestion  Regular robitussin, cough drops for cough  Warm salt water gargles for sore throat  Mucinex with lots of water to help you cough up the mucous in your chest if needed  Drink plenty of fluids and stay hydrated!  Wash your hands frequently.    Call the office [phone]) or go to [facility] if:  You begin to have strong, frequent contractions  Your water breaks.  Sometimes it is a big gush of fluid, sometimes it is just a trickle that keeps getting your panties wet or running down your legs  You have vaginal bleeding.  It is normal to have a small amount of spotting if your cervix was checked.   You don't feel your baby moving like normal.  If you don't, get you something to eat and drink and lay down and focus on feeling your baby move.  You should feel at least 10 movements in 2 hours.  If you don't, you should call the office or go to [facility].    Tdap Vaccine  It is recommended that you get the Tdap vaccine during the third trimester of EACH pregnancy to help protect your baby from getting pertussis (whooping cough)  27-36 weeks is the BEST time to do this so that you can pass the protection on to your baby. During pregnancy is better than after pregnancy, but if you are unable to get it during pregnancy it will be offered at the hospital.   You can get this vaccine at the health department or your family doctor  Everyone who will be around your baby should also be up-to-date on their vaccines. Adults (who are not pregnant) only need 1 dose of Tdap during adulthood.     Preterm Labor and Birth Information The normal length of a pregnancy is 39-41 weeks. Preterm labor is when labor starts before 37 completed weeks of pregnancy. What are the risk factors for preterm labor? Preterm labor is more likely to occur in women who:  Have certain infections during pregnancy such as a bladder infection, sexually  transmitted infection, or infection inside the uterus (chorioamnionitis).  Have a shorter-than-normal cervix.  Have gone into preterm labor before.  Have had surgery on their cervix.  Are younger than age 36 or older than age 10.  Are African American.  Are pregnant with twins or multiple babies (multiple gestation).  Take street drugs or smoke while pregnant.  Do not gain enough weight while pregnant.  Became pregnant shortly after having been pregnant.  What are the symptoms of preterm labor? Symptoms of preterm labor include:  Cramps similar to those that can happen during a menstrual period. The cramps may happen with diarrhea.  Pain in the abdomen or lower back.  Regular uterine contractions that may feel like tightening of the abdomen.  A feeling of increased pressure in the pelvis.  Increased watery or bloody mucus discharge from the vagina.  Water breaking (ruptured amniotic sac).  Why is it important to recognize signs of preterm labor? It is important to recognize signs of preterm labor because babies who are born prematurely may not be fully developed. This can put them at an increased risk for:  Long-term (chronic) heart and lung problems.  Difficulty immediately after birth with regulating body systems, including blood sugar, body temperature, heart rate, and breathing rate.  Bleeding in the brain.  Cerebral palsy.  Learning difficulties.  Death.  These risks are highest  for babies who are born before 34 weeks of pregnancy. How is preterm labor treated? Treatment depends on the length of your pregnancy, your condition, and the health of your baby. It may involve:  Having a stitch (suture) placed in your cervix to prevent your cervix from opening too early (cerclage).  Taking or being given medicines, such as: ? Hormone medicines. These may be given early in pregnancy to help support the pregnancy. ? Medicine to stop contractions. ? Medicines to  help mature the baby's lungs. These may be prescribed if the risk of delivery is high. ? Medicines to prevent your baby from developing cerebral palsy.  If the labor happens before 34 weeks of pregnancy, you may need to stay in the hospital. What should I do if I think I am in preterm labor? If you think that you are going into preterm labor, call your health care provider right away. How can I prevent preterm labor in future pregnancies? To increase your chance of having a full-term pregnancy:  Do not use any tobacco products, such as cigarettes, chewing tobacco, and e-cigarettes. If you need help quitting, ask your health care provider.  Do not use street drugs or medicines that have not been prescribed to you during your pregnancy.  Talk with your health care provider before taking any herbal supplements, even if you have been taking them regularly.  Make sure you gain a healthy amount of weight during your pregnancy.  Watch for infection. If you think that you might have an infection, get it checked right away.  Make sure to tell your health care provider if you have gone into preterm labor before.  This information is not intended to replace advice given to you by your health care provider. Make sure you discuss any questions you have with your health care provider. Document Released: 01/26/2004 Document Revised: 04/17/2016 Document Reviewed: 03/28/2016 Elsevier Interactive Patient Education  2018 [employer].

## 2017-09-04 ENCOUNTER — Ambulatory Visit (INDEPENDENT_AMBULATORY_CARE_PROVIDER_SITE_OTHER): Payer: Medicaid Other | Admitting: Women's Health

## 2017-09-04 ENCOUNTER — Encounter: Payer: Self-pay | Admitting: Women's Health

## 2017-09-04 VITALS — BP 104/52 | HR 93 | Wt 165.0 lb

## 2017-09-04 DIAGNOSIS — Z3A33 33 weeks gestation of pregnancy: Secondary | ICD-10-CM

## 2017-09-04 DIAGNOSIS — I8391 Asymptomatic varicose veins of right lower extremity: Secondary | ICD-10-CM

## 2017-09-04 DIAGNOSIS — Z1389 Encounter for screening for other disorder: Secondary | ICD-10-CM

## 2017-09-04 DIAGNOSIS — Z331 Pregnant state, incidental: Secondary | ICD-10-CM

## 2017-09-04 DIAGNOSIS — Z3483 Encounter for supervision of other normal pregnancy, third trimester: Secondary | ICD-10-CM

## 2017-09-04 HISTORY — DX: Asymptomatic varicose veins of right lower extremity: I83.91

## 2017-09-04 LAB — POCT URINALYSIS DIPSTICK
Blood, UA: NEGATIVE
GLUCOSE UA: NEGATIVE
KETONES UA: NEGATIVE
Leukocytes, UA: NEGATIVE
Nitrite, UA: NEGATIVE
PROTEIN UA: NEGATIVE

## 2017-09-04 NOTE — Progress Notes (Signed)
   LOW-RISK PREGNANCY VISIT Patient name: Tiffany Goodwin MRN [id number]  Date of birth: 08/13/1991 Chief Complaint:   Routine Prenatal Visit  History of Present Illness:   Tiffany Leapmber N Loyd is a 26 y.o. 302P1001 female at 453w6d with an Estimated Date of Delivery: 10/17/17 being seen today for ongoing management of a low-risk pregnancy.  Today she reports tailbone pain since last visit, denies fall/injury. Also spider veins Rt upper thigh worsening- has thigh-high compression stockings but doesn't cover this area. Contractions: Irregular. Vag. Bleeding: None.  Movement: Present. denies leaking of fluid. Review of Systems:   Pertinent items are noted in HPI Denies abnormal vaginal discharge w/ itching/odor/irritation, headaches, visual changes, shortness of breath, chest pain, abdominal pain, severe nausea/vomiting, or problems with urination or bowel movements unless otherwise stated above. Pertinent History Reviewed:  Reviewed past medical,surgical, social, obstetrical and family history.  Reviewed problem list, medications and allergies. Physical Assessment:   Vitals:   09/04/17 1415  BP: (!) 104/52  Pulse: 93  Weight: 165 lb (74.8 kg)  Body mass index is 25.84 kg/m.        Physical Examination:   General appearance: Well appearing, and in no distress  Mental status: Alert, oriented to person, place, and time  Skin: Warm & dry  Cardiovascular: Normal heart rate noted  Respiratory: Normal respiratory effort, no distress  Abdomen: Soft, gravid, nontender  Pelvic: Cervical exam deferred    Buttocks: No evidence of pilonidal cyst, etc in area of pain, mild tenderness to palpation  Extremities: Edema: None, cluster of spider veins Rt upper inner/posterior thigh, no evidence of thrombosis  Fetal Status: Fetal Heart Rate (bpm): 140 Fundal Height: 34 cm Movement: Present    Results for orders placed or performed in visit on 09/04/17 (from the past 24 hour(s))  POCT urinalysis dipstick   Collection Time: 09/04/17  2:16 PM  Result Value Ref Range   Color, UA     Clarity, UA     Glucose, UA neg    Bilirubin, UA     Ketones, UA neg    Spec Grav, UA  1.010 - 1.025   Blood, UA neg    pH, UA  5.0 - 8.0   Protein, UA neg    Urobilinogen, UA  0.2 or 1.0 E.U./dL   Nitrite, UA neg    Leukocytes, UA Negative Negative    Assessment & Plan:  1) Low-risk pregnancy G2P1001 at [redacted]w[redacted]d with an Estimated Date of Delivery: 10/17/17   2) Tailbone pain, can use donut pillow, apap prn  3) Spider veins> see if [facility] has maternity compression stockings that go up to/cover belly, if not check online   Labs/procedures today: none  Plan:  Continue routine obstetrical care   Reviewed: Preterm labor symptoms and general obstetric precautions including but not limited to vaginal bleeding, contractions, leaking of fluid and fetal movement were reviewed in detail with the patient.  All questions were answered  Follow-up: Return in about 2 weeks (around 09/18/2017) for LROB.  Orders Placed This Encounter  Procedures  . POCT urinalysis dipstick   Marge DuncansBooker, Kimberly Randall CNM, [facility] 09/04/2017 2:39 PM

## 2017-09-04 NOTE — Patient Instructions (Signed)
Call the office ([phone]) or go to [facility] if:  You begin to have strong, frequent contractions  Your water breaks.  Sometimes it is a big gush of fluid, sometimes it is just a trickle that keeps getting your panties wet or running down your legs  You have vaginal bleeding.  It is normal to have a small amount of spotting if your cervix was checked.   You don't feel your baby moving like normal.  If you don't, get you something to eat and drink and lay down and focus on feeling your baby move.  You should feel at least 10 movements in 2 hours.  If you don't, you should call the office or go to [facility].     Preterm Labor and Birth Information The normal length of a pregnancy is 39-41 weeks. Preterm labor is when labor starts before 37 completed weeks of pregnancy. What are the risk factors for preterm labor? Preterm labor is more likely to occur in women who:  Have certain infections during pregnancy such as a bladder infection, sexually transmitted infection, or infection inside the uterus (chorioamnionitis).  Have a shorter-than-normal cervix.  Have gone into preterm labor before.  Have had surgery on their cervix.  Are younger than age 17 or older than age 35.  Are African American.  Are pregnant with twins or multiple babies (multiple gestation).  Take street drugs or smoke while pregnant.  Do not gain enough weight while pregnant.  Became pregnant shortly after having been pregnant.  What are the symptoms of preterm labor? Symptoms of preterm labor include:  Cramps similar to those that can happen during a menstrual period. The cramps may happen with diarrhea.  Pain in the abdomen or lower back.  Regular uterine contractions that may feel like tightening of the abdomen.  A feeling of increased pressure in the pelvis.  Increased watery or bloody mucus discharge from the vagina.  Water breaking (ruptured amniotic sac).  Why is it important to  recognize signs of preterm labor? It is important to recognize signs of preterm labor because babies who are born prematurely may not be fully developed. This can put them at an increased risk for:  Long-term (chronic) heart and lung problems.  Difficulty immediately after birth with regulating body systems, including blood sugar, body temperature, heart rate, and breathing rate.  Bleeding in the brain.  Cerebral palsy.  Learning difficulties.  Death.  These risks are highest for babies who are born before 34 weeks of pregnancy. How is preterm labor treated? Treatment depends on the length of your pregnancy, your condition, and the health of your baby. It may involve:  Having a stitch (suture) placed in your cervix to prevent your cervix from opening too early (cerclage).  Taking or being given medicines, such as: ? Hormone medicines. These may be given early in pregnancy to help support the pregnancy. ? Medicine to stop contractions. ? Medicines to help mature the baby's lungs. These may be prescribed if the risk of delivery is high. ? Medicines to prevent your baby from developing cerebral palsy.  If the labor happens before 34 weeks of pregnancy, you may need to stay in the hospital. What should I do if I think I am in preterm labor? If you think that you are going into preterm labor, call your health care provider right away. How can I prevent preterm labor in future pregnancies? To increase your chance of having a full-term pregnancy:  Do not use   any tobacco products, such as cigarettes, chewing tobacco, and e-cigarettes. If you need help quitting, ask your health care provider.  Do not use street drugs or medicines that have not been prescribed to you during your pregnancy.  Talk with your health care provider before taking any herbal supplements, even if you have been taking them regularly.  Make sure you gain a healthy amount of weight during your pregnancy.  Watch  for infection. If you think that you might have an infection, get it checked right away.  Make sure to tell your health care provider if you have gone into preterm labor before.  This information is not intended to replace advice given to you by your health care provider. Make sure you discuss any questions you have with your health care provider. Document Released: 01/26/2004 Document Revised: 04/17/2016 Document Reviewed: 03/28/2016 Elsevier Interactive Patient Education  2018 Elsevier Inc.  

## 2017-09-05 ENCOUNTER — Telehealth: Payer: Self-pay | Admitting: *Deleted

## 2017-09-05 NOTE — Telephone Encounter (Signed)
Spoke with pt. Pt has a white discharge. She has recently shaved and has some bumps. Some itching. I advised white discharge is normal during pregnancy. Discharge has no odor. Itching could be from recent shave. If pt feels like she has a yeast infection, can try OTC Monistat. Pt voiced understanding. Next scheduled appt is 10/31. If anything changes and pt feels like she needs to be seen sooner, call office. JSY

## 2017-09-14 ENCOUNTER — Encounter (HOSPITAL_COMMUNITY): Payer: Self-pay | Admitting: *Deleted

## 2017-09-14 ENCOUNTER — Inpatient Hospital Stay (HOSPITAL_COMMUNITY)
Admission: AD | Admit: 2017-09-14 | Discharge: 2017-09-14 | Disposition: A | Discharge status: Home / Self Care | Payer: Medicaid Other | Source: Ambulatory Visit | Attending: Obstetrics and Gynecology | Admitting: Obstetrics and Gynecology

## 2017-09-14 DIAGNOSIS — M545 Low back pain: Secondary | ICD-10-CM | POA: Diagnosis present

## 2017-09-14 DIAGNOSIS — R109 Unspecified abdominal pain: Secondary | ICD-10-CM | POA: Diagnosis not present

## 2017-09-14 DIAGNOSIS — Z809 Family history of malignant neoplasm, unspecified: Secondary | ICD-10-CM | POA: Insufficient documentation

## 2017-09-14 DIAGNOSIS — Z808 Family history of malignant neoplasm of other organs or systems: Secondary | ICD-10-CM | POA: Diagnosis not present

## 2017-09-14 DIAGNOSIS — Z823 Family history of stroke: Secondary | ICD-10-CM | POA: Diagnosis not present

## 2017-09-14 DIAGNOSIS — O26893 Other specified pregnancy related conditions, third trimester: Secondary | ICD-10-CM | POA: Diagnosis not present

## 2017-09-14 DIAGNOSIS — Z800 Family history of malignant neoplasm of digestive organs: Secondary | ICD-10-CM | POA: Diagnosis not present

## 2017-09-14 DIAGNOSIS — M549 Dorsalgia, unspecified: Secondary | ICD-10-CM

## 2017-09-14 DIAGNOSIS — Z8619 Personal history of other infectious and parasitic diseases: Secondary | ICD-10-CM | POA: Diagnosis not present

## 2017-09-14 DIAGNOSIS — Z9889 Other specified postprocedural states: Secondary | ICD-10-CM | POA: Diagnosis not present

## 2017-09-14 DIAGNOSIS — Z8249 Family history of ischemic heart disease and other diseases of the circulatory system: Secondary | ICD-10-CM | POA: Diagnosis not present

## 2017-09-14 DIAGNOSIS — R102 Pelvic and perineal pain: Secondary | ICD-10-CM | POA: Insufficient documentation

## 2017-09-14 DIAGNOSIS — Z3A35 35 weeks gestation of pregnancy: Secondary | ICD-10-CM | POA: Insufficient documentation

## 2017-09-14 DIAGNOSIS — O9989 Other specified diseases and conditions complicating pregnancy, childbirth and the puerperium: Secondary | ICD-10-CM

## 2017-09-14 LAB — URINALYSIS, ROUTINE W REFLEX MICROSCOPIC
Bilirubin Urine: NEGATIVE
GLUCOSE, UA: NEGATIVE mg/dL
Hgb urine dipstick: NEGATIVE
Ketones, ur: NEGATIVE mg/dL
LEUKOCYTES UA: NEGATIVE
Nitrite: NEGATIVE
PH: 7 (ref 5.0–8.0)
Protein, ur: NEGATIVE mg/dL
SPECIFIC GRAVITY, URINE: 1.011 (ref 1.005–1.030)

## 2017-09-14 NOTE — Discharge Instructions (Signed)
Abdominal Pain During Pregnancy Abdominal pain is common in pregnancy. Most of the time, it does not cause harm. There are many causes of abdominal pain. Some causes are more serious than others and sometimes the cause is not known. Abdominal pain can be a sign that something is very wrong with the pregnancy or the pain may have nothing to do with the pregnancy. Always tell your health care provider if you have any abdominal pain. Follow these instructions at home:  Do not have sex or put anything in your vagina until your symptoms go away completely.  Watch your abdominal pain for any changes.  Get plenty of rest until your pain improves.  Drink enough fluid to keep your urine clear or pale yellow.  Take over-the-counter or prescription medicines only as told by your health care provider.  Keep all follow-up visits as told by your health care provider. This is important. Contact a health care provider if:  You have a fever.  Your pain gets worse or you have cramping.  Your pain continues after resting. Get help right away if:  You are bleeding, leaking fluid, or passing tissue from the vagina.  You have vomiting or diarrhea that does not go away.  You have painful or bloody urination.  You notice a decrease in your baby's movements.  You feel very weak or faint.  You have shortness of breath.  You develop a severe headache with abdominal pain.  You have abnormal vaginal discharge with abdominal pain. This information is not intended to replace advice given to you by your health care provider. Make sure you discuss any questions you have with your health care provider. Document Released: 11/05/2005 Document Revised: 08/16/2016 Document Reviewed: 06/04/2013 Elsevier Interactive Patient Education  2018 Elsevier Inc.  Back Pain in Pregnancy Back pain during pregnancy is common. Back pain may be caused by several factors that are related to changes during your  pregnancy. Follow these instructions at home: Managing pain, stiffness, and swelling  If directed, apply ice for sudden (acute) back pain. ? Put ice in a plastic bag. ? Place a towel between your skin and the bag. ? Leave the ice on for 20 minutes, 2-3 times per day.  If directed, apply heat to the affected area before you exercise: ? Place a towel between your skin and the heat pack or heating pad. ? Leave the heat on for 20-30 minutes. ? Remove the heat if your skin turns bright red. This is especially important if you are unable to feel pain, heat, or cold. You may have a greater risk of getting burned. Activity  Exercise as told by your health care provider. Exercising is the best way to prevent or manage back pain.  Listen to your body when lifting. If lifting hurts, ask for help or bend your knees. This uses your leg muscles instead of your back muscles.  Squat down when picking up something from the floor. Do not bend over.  Only use bed rest as told by your health care provider. Bed rest should only be used for the most severe episodes of back pain. Standing, Sitting, and Lying Down  Do not stand in one place for long periods of time.  Use good posture when sitting. Make sure your head rests over your shoulders and is not hanging forward. Use a pillow on your lower back if necessary.  Try sleeping on your side, preferably the left side, with a pillow or two between your legs. If  you are sore after a night's rest, your bed may be too soft. A firm mattress may provide more support for your back during pregnancy. General instructions  Do not wear high heels.  Eat a healthy diet. Try to gain weight within your health care provider's recommendations.  Use a maternity girdle, elastic sling, or back brace as told by your health care provider.  Take over-the-counter and prescription medicines only as told by your health care provider.  Keep all follow-up visits as told by your  health care provider. This is important. This includes any visits with any specialists, such as a physical therapist. Contact a health care provider if:  Your back pain interferes with your daily activities.  You have increasing pain in other parts of your body. Get help right away if:  You develop numbness, tingling, weakness, or problems with the use of your arms or legs.  You develop severe back pain that is not controlled with medicine.  You have a sudden change in bowel or bladder control.  You develop shortness of breath, dizziness, or you faint.  You develop nausea, vomiting, or sweating.  You have back pain that is a rhythmic, cramping pain similar to labor pains. Labor pain is usually 1-2 minutes apart, lasts for about 1 minute, and involves a bearing down feeling or pressure in your pelvis.  You have back pain and your water breaks or you have vaginal bleeding.  You have back pain or numbness that travels down your leg.  Your back pain developed after you fell.  You develop pain on one side of your back.  You see blood in your urine.  You develop skin blisters in the area of your back pain. This information is not intended to replace advice given to you by your health care provider. Make sure you discuss any questions you have with your health care provider. Document Released: 02/13/2006 Document Revised: 04/12/2016 Document Reviewed: 07/20/2015 Elsevier Interactive Patient Education  [employer]2018 [employer] Inc.

## 2017-09-14 NOTE — MAU Note (Signed)
Patient states, "I've been having back pain since this morning. I also felt lightheaded this morning." Patient reports taking Tylenol last night, but no relief. Patient denies any vaginal bleeding and discharge. Patient states that she ate a bowl of cereal this morning.

## 2017-09-14 NOTE — MAU Provider Note (Signed)
History   G2P1001 @ 35.2 wkis in with c/o low abd and low back pain since this morning. Denies vag bleeding or ROM  CSN: [id number]  Arrival date & time 09/14/17  1229   None     Chief Complaint  Patient presents with  . Back Pain    HPI  Past Medical History:  Diagnosis Date  . Anxiety   . BV (bacterial vaginosis) 10/21/2014  . Itching in the vaginal area 12/07/2015  . LLQ pain 04/19/2015  . Missed period 04/19/2015  . Patient desires pregnancy 12/07/2015  . Pelvic pain in female 11/18/2014  . Pneumonia 2016,january   left side  . Screening for STD (sexually transmitted disease) 01/20/2015  . Vaginal discharge 10/21/2014    Past Surgical History:  Procedure Laterality Date  . TONSILLECTOMY    . WISDOM TOOTH EXTRACTION    . WISDOM TOOTH EXTRACTION      Family History  Problem Relation Age of Onset  . Heart attack Maternal Grandmother   . Stroke Maternal Grandmother   . Other Paternal Grandfather        brain aneursym  . Cancer Maternal Grandfather        bone,chest,liver,brain,pancreatic,throat  . Anesthesia problems Neg Hx   . Hypotension Neg Hx   . Malignant hyperthermia Neg Hx   . Pseudochol deficiency Neg Hx     Social History  Substance Use Topics  . Smoking status: Never Smoker  . Smokeless tobacco: Never Used  . Alcohol use No    OB History    Gravida Para Term Preterm AB Living   2 1 1  0 0 1   SAB TAB Ectopic Multiple Live Births   0 0 0 0 1      Review of Systems  Constitutional: Negative.   HENT: Negative.   Eyes: Negative.   Respiratory: Negative.   Cardiovascular: Negative.   Gastrointestinal: Positive for abdominal pain.  Endocrine: Negative.   Genitourinary: Negative.   Musculoskeletal: Positive for back pain.  Skin: Negative.   Allergic/Immunologic: Negative.   Neurological: Negative.   Hematological: Negative.   Psychiatric/Behavioral: Negative.     Allergies  Patient has no known allergies.  Home Medications    BP  130/73 (BP Location: Left Arm)   Pulse 88   Temp 98.1 F (36.7 C) (Oral)   Resp 16   Ht 5\' 7"  (1.702 m)   Wt 164 lb (74.4 kg)   LMP 01/04/2017   SpO2 100%   BMI 25.69 kg/m   Physical Exam  Constitutional: She is oriented to person, place, and time. She appears well-developed and well-nourished.  HENT:  Head: Normocephalic.  Neck: Normal range of motion.  Cardiovascular: Normal rate, regular rhythm, normal heart sounds and intact distal pulses.   Pulmonary/Chest: Effort normal and breath sounds normal.  Abdominal: Soft. Bowel sounds are normal.  Genitourinary: Vagina normal and uterus normal.  Musculoskeletal: Normal range of motion.  Neurological: She is alert and oriented to person, place, and time. She has normal reflexes.  Skin: Skin is warm and dry.  Psychiatric: She has a normal mood and affect. Her behavior is normal. Judgment and thought content normal.    MAU Course  Procedures (including critical care time)  Labs Reviewed  URINALYSIS, ROUTINE W REFLEX MICROSCOPIC   No results found.   1. Abdominal pain in pregnancy, third trimester   2. Back pain affecting pregnancy in third trimester       MDM  FHR pattern reactive with 15x15  accels no decels. No uc's. SVE cl/th/post/high. VSS. Discussed homes measures to help with discomfoerts of pregnancy. Will d/c home

## 2017-09-18 ENCOUNTER — Encounter: Payer: Self-pay | Admitting: Advanced Practice Midwife

## 2017-09-18 ENCOUNTER — Ambulatory Visit (INDEPENDENT_AMBULATORY_CARE_PROVIDER_SITE_OTHER): Payer: Medicaid Other | Admitting: Advanced Practice Midwife

## 2017-09-18 VITALS — BP 120/70 | HR 96 | Wt 164.0 lb

## 2017-09-18 DIAGNOSIS — Z1389 Encounter for screening for other disorder: Secondary | ICD-10-CM

## 2017-09-18 DIAGNOSIS — Z3A35 35 weeks gestation of pregnancy: Secondary | ICD-10-CM

## 2017-09-18 DIAGNOSIS — Z3483 Encounter for supervision of other normal pregnancy, third trimester: Secondary | ICD-10-CM

## 2017-09-18 DIAGNOSIS — Z331 Pregnant state, incidental: Secondary | ICD-10-CM

## 2017-09-18 LAB — POCT URINALYSIS DIPSTICK
Glucose, UA: NEGATIVE
Leukocytes, UA: NEGATIVE
Nitrite, UA: NEGATIVE
RBC UA: NEGATIVE

## 2017-09-18 NOTE — Patient Instructions (Addendum)
AM I IN LABOR? What is labor? Labor is the work that your body does to birth your baby. Your uterus (the womb) contracts. Your cervix (the mouth of the uterus) opens. You will push your baby out into the world.  What do contractions (labor pains) feel like? When they first start, contractions usually feel like cramps during your period. Sometimes you feel pain in your back. Most often, contractions feel like muscles pulling painfully in your lower belly. At first, the contractions will probably be 15 to 20 minutes apart. They will not feel too painful. As labor goes on, the contractions get stronger, closer together, and more painful.  How do I time the contractions? Time your contractions by counting the number of minutes from the start of one contraction to the start of the next contraction.  What should I do when the contractions start? If it is night and you can sleep, sleep. If it happens during the day, here are some things you can do to take care of yourself at home: ? Walk. If the pains you are having are real labor, walking will make the contractions come faster and harder. If the contractions are not going to continue and be real labor, walking will make the contractions slow down. ? Take a shower or bath. This will help you relax. ? Eat. Labor is a big event. It takes a lot of energy. ? Drink water. Not drinking enough water can cause false labor (contractions that hurt but do not open your cervix). If this is true labor, drinking water will help you have strength to get through your labor. ? Take a nap. Get all the rest you can. ? Get a massage. If your labor is in your back, a strong massage on your lower back may feel very good. Getting a foot massage is always good. ? Don't panic. You can do this. Your body was made for this. You are strong!  When should I go to the hospital or call my health care provider? ? Your contractions have been 5 minutes apart or less for at least 1  hour. ? If several contractions are so painful you cannot walk or talk during one. ? Your bag of waters breaks. (You may have a big gush of water or just water that runs down your legs when you walk.)  Are there other reasons to call my health care provider? Yes, you should call your health care provider or go to the hospital if you start to bleed like you are having a period- blood that soaks your underwear or runs down your legs, if you have sudden severe pain, if your baby has not moved for several hours, or if you are leaking green fluid. The rule is as follows: If you are very concerned about something, call.  Back Pain in Pregnancy Back pain during pregnancy is common. Back pain may be caused by several factors that are related to changes during your pregnancy. Follow these instructions at home: Managing pain, stiffness, and swelling  If directed, apply ice for sudden (acute) back pain. ? Put ice in a plastic bag. ? Place a towel between your skin and the bag. ? Leave the ice on for 20 minutes, 2-3 times per day.  If directed, apply heat to the affected area before you exercise: ? Place a towel between your skin and the heat pack or heating pad. ? Leave the heat on for 20-30 minutes. ? Remove the heat if your skin turns  bright red. This is especially important if you are unable to feel pain, heat, or cold. You may have a greater risk of getting burned. Activity  Exercise as told by your health care provider. Exercising is the best way to prevent or manage back pain.  Listen to your body when lifting. If lifting hurts, ask for help or bend your knees. This uses your leg muscles instead of your back muscles.  Squat down when picking up something from the floor. Do not bend over.  Only use bed rest as told by your health care provider. Bed rest should only be used for the most severe episodes of back pain. Standing, Sitting, and Lying Down  Do not stand in one place for long  periods of time.  Use good posture when sitting. Make sure your head rests over your shoulders and is not hanging forward. Use a pillow on your lower back if necessary.  Try sleeping on your side, preferably the left side, with a pillow or two between your legs. If you are sore after a night's rest, your bed may be too soft. A firm mattress may provide more support for your back during pregnancy. General instructions  Do not wear high heels.  Eat a healthy diet. Try to gain weight within your health care provider's recommendations.  Use a maternity girdle, elastic sling, or back brace as told by your health care provider.  Take over-the-counter and prescription medicines only as told by your health care provider.  Keep all follow-up visits as told by your health care provider. This is important. This includes any visits with any specialists, such as a physical therapist. Contact a health care provider if:  Your back pain interferes with your daily activities.  You have increasing pain in other parts of your body. Get help right away if:  You develop numbness, tingling, weakness, or problems with the use of your arms or legs.  You develop severe back pain that is not controlled with medicine.  You have a sudden change in bowel or bladder control.  You develop shortness of breath, dizziness, or you faint.  You develop nausea, vomiting, or sweating.  You have back pain that is a rhythmic, cramping pain similar to labor pains. Labor pain is usually 1-2 minutes apart, lasts for about 1 minute, and involves a bearing down feeling or pressure in your pelvis.  You have back pain and your water breaks or you have vaginal bleeding.  You have back pain or numbness that travels down your leg.  Your back pain developed after you fell.  You develop pain on one side of your back.  You see blood in your urine.  You develop skin blisters in the area of your back pain. This information is  not intended to replace advice given to you by your health care provider. Make sure you discuss any questions you have with your health care provider. Document Released: 02/13/2006 Document Revised: 04/12/2016 Document Reviewed: 07/20/2015 Elsevier Interactive Patient Education  [employer].

## 2017-09-18 NOTE — Progress Notes (Signed)
G2P1001 4275w6d Estimated Date of Delivery: 10/17/17  Blood pressure 120/70, pulse 96, weight 164 lb (74.4 kg), last menstrual period 01/04/2017.   BP weight and urine results all reviewed and noted.  Please refer to the obstetrical flow sheet for the fundal height and fetal heart rate documentation:  Patient reports good fetal movement, denies any bleeding and no rupture of membranes symptoms or regular contractions. Patient is without complaints. All questions were answered.  Orders Placed This Encounter  Procedures  . POCT urinalysis dipstick    Plan:  Continued routine obstetrical care,   Return in about 1 week (around 09/25/2017) for LROB.

## 2017-09-25 ENCOUNTER — Encounter: Payer: Self-pay | Admitting: Obstetrics and Gynecology

## 2017-09-25 ENCOUNTER — Ambulatory Visit (INDEPENDENT_AMBULATORY_CARE_PROVIDER_SITE_OTHER): Payer: Medicaid Other | Admitting: Obstetrics and Gynecology

## 2017-09-25 VITALS — BP 120/70 | HR 97 | Wt 168.0 lb

## 2017-09-25 DIAGNOSIS — Z3483 Encounter for supervision of other normal pregnancy, third trimester: Secondary | ICD-10-CM

## 2017-09-25 DIAGNOSIS — Z3A36 36 weeks gestation of pregnancy: Secondary | ICD-10-CM

## 2017-09-25 DIAGNOSIS — Z1389 Encounter for screening for other disorder: Secondary | ICD-10-CM

## 2017-09-25 DIAGNOSIS — Z331 Pregnant state, incidental: Secondary | ICD-10-CM

## 2017-09-25 LAB — POCT URINALYSIS DIPSTICK
Glucose, UA: NEGATIVE
Ketones, UA: NEGATIVE
LEUKOCYTES UA: NEGATIVE
NITRITE UA: NEGATIVE
Protein, UA: NEGATIVE
RBC UA: NEGATIVE

## 2017-09-25 LAB — OB RESULTS CONSOLE GBS: GBS: NEGATIVE

## 2017-09-25 NOTE — Progress Notes (Signed)
Patient ID: Tiffany Goodwin, female   DOB: 04/10/1991, 26 y.o.   MRN: [id number]  [facility]: [facility]  Chief Complaint  Patient presents with  . Routine Prenatal Visit    GBS- GC- CHL  ____  Patient complaints: Pt reports today for Group B Strep, repeat gonorrhea and chlamydia. She reports mild discomfort from a hemorrhoid. She adds that she has been uncomfortable for the last few weeks. Her other child is about to be 26 years old. Patient reports good fetal movement, denies any bleeding, rupture of membranes, or regular contractions.  Blood pressure 120/70, pulse 97, weight 168 lb (76.2 kg), last menstrual period 01/04/2017.   Urine results:notable for negative refer to the ob flow sheet for FH and FHR, ,                          Physical Examination: General appearance - alert, well appearing, and in no distress and oriented to person, place, and time                                      Abdomen - FH 37 cm,                                                         -FHR 141                                      Pelvic - minimal erythema from external hemorrhoid                                       Cervix: 1 cm, 20%, -2 Questions were answered. Assessment: LROB G2P1001 @ 3277w6d   Plan:  Continued routine obstetrical care,   F/u in 1 weeks for LROB  By signing my name below, I, Diona BrownerJennifer Gorman, attest that this documentation has been prepared under the direction and in the presence of Tilda BurrowFerguson, Gabbriella Presswood V, MD. Electronically Signed: Diona BrownerJennifer Gorman, Medical Scribe. 09/25/17. 3:04 PM.  I personally performed the services described in this documentation, which was SCRIBED in my presence. The recorded information has been reviewed and considered accurate. It has been edited as necessary during review. Tilda BurrowFERGUSON,Beulah Capobianco V, MD

## 2017-09-27 LAB — STREP GP B NAA: Strep Gp B NAA: NEGATIVE

## 2017-09-28 LAB — GC/CHLAMYDIA PROBE AMP
CHLAMYDIA, DNA PROBE: NEGATIVE
NEISSERIA GONORRHOEAE BY PCR: NEGATIVE

## 2017-10-02 ENCOUNTER — Encounter: Payer: Self-pay | Admitting: Women's Health

## 2017-10-02 ENCOUNTER — Ambulatory Visit (INDEPENDENT_AMBULATORY_CARE_PROVIDER_SITE_OTHER): Payer: Medicaid Other | Admitting: Women's Health

## 2017-10-02 VITALS — BP 124/66 | HR 88 | Wt 170.0 lb

## 2017-10-02 DIAGNOSIS — Z3A37 37 weeks gestation of pregnancy: Secondary | ICD-10-CM

## 2017-10-02 DIAGNOSIS — Z331 Pregnant state, incidental: Secondary | ICD-10-CM

## 2017-10-02 DIAGNOSIS — Z1389 Encounter for screening for other disorder: Secondary | ICD-10-CM

## 2017-10-02 DIAGNOSIS — Z3483 Encounter for supervision of other normal pregnancy, third trimester: Secondary | ICD-10-CM

## 2017-10-02 LAB — POCT URINALYSIS DIPSTICK
Blood, UA: NEGATIVE
Glucose, UA: NEGATIVE
Ketones, UA: NEGATIVE
LEUKOCYTES UA: NEGATIVE
NITRITE UA: NEGATIVE
PROTEIN UA: NEGATIVE

## 2017-10-02 NOTE — Progress Notes (Signed)
   LOW-RISK PREGNANCY VISIT Patient name: Tiffany Goodwin MRN [id number]  Date of birth: 03/22/1991 Chief Complaint:   Routine Prenatal Visit  History of Present Illness:   Tiffany Leapmber N Kloc is a 26 y.o. 462P1001 female at [redacted]w[redacted]d with an Estimated Date of Delivery: 10/17/17 being seen today for ongoing management of a low-risk pregnancy.  Today she reports no complaints. Contractions: Not present. Vag. Bleeding: None.  Movement: Present. denies leaking of fluid. Review of Systems:   Pertinent items are noted in HPI Denies abnormal vaginal discharge w/ itching/odor/irritation, headaches, visual changes, shortness of breath, chest pain, abdominal pain, severe nausea/vomiting, or problems with urination or bowel movements unless otherwise stated above. Pertinent History Reviewed:  Reviewed past medical,surgical, social, obstetrical and family history.  Reviewed problem list, medications and allergies. Physical Assessment:   Vitals:   10/02/17 1435  BP: 124/66  Pulse: 88  Weight: 170 lb (77.1 kg)  Body mass index is 26.63 kg/m.        Physical Examination:   General appearance: Well appearing, and in no distress  Mental status: Alert, oriented to person, place, and time  Skin: Warm & dry  Cardiovascular: Normal heart rate noted  Respiratory: Normal respiratory effort, no distress  Abdomen: Soft, gravid, nontender  Pelvic: Cervical exam performed  Dilation: 1.5 Effacement (%): Thick Station: -2  Extremities: Edema: None  Fetal Status: Fetal Heart Rate (bpm): 138 Fundal Height: 38 cm Movement: Present Presentation: Vertex  Results for orders placed or performed in visit on 10/02/17 (from the past 24 hour(s))  POCT Urinalysis Dipstick   Collection Time: 10/02/17  2:39 PM  Result Value Ref Range   Color, UA     Clarity, UA     Glucose, UA neg    Bilirubin, UA     Ketones, UA neg    Spec Grav, UA  1.010 - 1.025   Blood, UA neg    pH, UA  5.0 - 8.0   Protein, UA neg    Urobilinogen, UA   0.2 or 1.0 E.U./dL   Nitrite, UA neg    Leukocytes, UA Negative Negative    Assessment & Plan:  1) Low-risk pregnancy G2P1001 at 499w6d with an Estimated Date of Delivery: 10/17/17    Labs/procedures today: sve  Plan:  Continue routine obstetrical care   Reviewed: Term labor symptoms and general obstetric precautions including but not limited to vaginal bleeding, contractions, leaking of fluid and fetal movement were reviewed in detail with the patient.  All questions were answered  Follow-up: Return in about 1 week (around 10/09/2017) for LROB.  Orders Placed This Encounter  Procedures  . POCT Urinalysis Dipstick   Marge DuncansBooker, Lougenia Morrissey Randall CNM, [facility] PortlandWHNP-BC 10/02/2017 3:01 PM

## 2017-10-02 NOTE — Patient Instructions (Addendum)
June LeapAmber N Danowski, I greatly value your feedback.  If you receive a survey following your visit with us today, we appreciate you taking the time to fill it out.  Thanks, Joellyn HaffKim Briar Sword, CNM, WHNP-BC   Call the office [phone]) or go to [facility] if:  You begin to have strong, frequent contractions  Your water breaks.  Sometimes it is a big gush of fluid, sometimes it is just a trickle that keeps getting your panties wet or running down your legs  You have vaginal bleeding.  It is normal to have a small amount of spotting if your cervix was checked.   You don't feel your baby moving like normal.  If you don't, get you something to eat and drink and lay down and focus on feeling your baby move.  You should feel at least 10 movements in 2 hours.  If you don't, you should call the office or go to [facility].    [facility] Center[facility] Hicks Contractions Contractions of the uterus can occur throughout pregnancy, but they are not always a sign that you are in labor. You may have practice contractions called Braxton Hicks contractions. These false labor contractions are sometimes confused with true labor. What are Deberah PeltonBraxton Hicks contractions? Braxton Hicks contractions are tightening movements that occur in the muscles of the uterus before labor. Unlike true labor contractions, these contractions do not result in opening (dilation) and thinning of the cervix. Toward the end of pregnancy (32-34 weeks), Braxton Hicks contractions can happen more often and may become stronger. These contractions are sometimes difficult to tell apart from true labor because they can be very uncomfortable. You should not feel embarrassed if you go to the hospital with false labor. Sometimes, the only way to tell if you are in true labor is for your health care provider to look for changes in the cervix. The health care provider will do a physical exam and may monitor your contractions. If you are not in true labor, the exam should show  that your cervix is not dilating and your water has not broken. If there are no prenatal problems or other health problems associated with your pregnancy, it is completely safe for you to be sent home with false labor. You may continue to have Braxton Hicks contractions until you go into true labor. How can I tell the difference between true labor and false labor?  Differences ? False labor ? Contractions last 30-70 seconds.: Contractions are usually shorter and not as strong as true labor contractions. ? Contractions become very regular.: Contractions are usually irregular. ? Discomfort is usually felt in the top of the uterus, and it spreads to the lower abdomen and low back.: Contractions are often felt in the front of the lower abdomen and in the groin. ? Contractions do not go away with walking.: Contractions may go away when you walk around or change positions while lying down. ? Contractions usually become more intense and increase in frequency.: Contractions get weaker and are shorter-lasting as time goes on. ? The cervix dilates and gets thinner.: The cervix usually does not dilate or become thin. Follow these instructions at home:  Take over-the-counter and prescription medicines only as told by your health care provider.  Keep up with your usual exercises and follow other instructions from your health care provider.  Eat and drink lightly if you think you are going into labor.  If Braxton Hicks contractions are making you uncomfortable: ? Change your position from lying down  or resting to walking, or change from walking to resting. ? Sit and rest in a tub of warm water. ? Drink enough fluid to keep your urine clear or pale yellow. Dehydration may cause these contractions. ? Do slow and deep breathing several times an hour.  Keep all follow-up prenatal visits as told by your health care provider. This is important. Contact a health care provider if:  You have a fever.  You  have continuous pain in your abdomen. Get help right away if:  Your contractions become stronger, more regular, and closer together.  You have fluid leaking or gushing from your vagina.  You pass blood-tinged mucus (bloody show).  You have bleeding from your vagina.  You have low back pain that you never had before.  You feel your baby's head pushing down and causing pelvic pressure.  Your baby is not moving inside you as much as it used to. Summary  Contractions that occur before labor are called Braxton Hicks contractions, false labor, or practice contractions.  Braxton Hicks contractions are usually shorter, weaker, farther apart, and less regular than true labor contractions. True labor contractions usually become progressively stronger and regular and they become more frequent.  Manage discomfort from [facility] Hicks contractions by changing position, resting in a warm bath, drinking plenty of water, or practicing deep breathing. This information is not intended to replace advice given to you by your health care provider. Make sure you discuss any questions you have with your health care provider. Document Released: 11/05/2005 Document Revised: 09/24/2016 Document Reviewed: 09/24/2016 Elsevier Interactive Patient Education  2017 [employer]

## 2017-10-09 ENCOUNTER — Ambulatory Visit (INDEPENDENT_AMBULATORY_CARE_PROVIDER_SITE_OTHER): Payer: Medicaid Other | Admitting: Obstetrics and Gynecology

## 2017-10-09 VITALS — BP 118/66 | HR 80 | Wt 169.0 lb

## 2017-10-09 DIAGNOSIS — Z331 Pregnant state, incidental: Secondary | ICD-10-CM

## 2017-10-09 DIAGNOSIS — Z3A38 38 weeks gestation of pregnancy: Secondary | ICD-10-CM

## 2017-10-09 DIAGNOSIS — Z3483 Encounter for supervision of other normal pregnancy, third trimester: Secondary | ICD-10-CM

## 2017-10-09 DIAGNOSIS — Z3403 Encounter for supervision of normal first pregnancy, third trimester: Secondary | ICD-10-CM

## 2017-10-09 DIAGNOSIS — Z1389 Encounter for screening for other disorder: Secondary | ICD-10-CM

## 2017-10-09 LAB — POCT URINALYSIS DIPSTICK
Glucose, UA: NEGATIVE
Ketones, UA: NEGATIVE
Leukocytes, UA: NEGATIVE
NITRITE UA: NEGATIVE
PROTEIN UA: NEGATIVE
RBC UA: NEGATIVE

## 2017-10-09 NOTE — Progress Notes (Signed)
Tiffany Goodwin is a 26 y.o. female G2P1001  Estimated Date of Delivery: 10/17/17 LROB 4224w6d                      Routine visit Chief Complaint  Patient presents with  . Routine Prenatal Visit  ____  Patient has no complaints. Pt's first labor was 'quick and easy'.  Patient reports good fetal movement,                          denies any bleeding , rupture of membranes,or regular contractions.  Blood pressure 118/66, pulse 80, weight 169 lb (76.7 kg), last menstrual period 01/04/2017.   Urine results:notable for none refer to the ob flow sheet for FH and FHR, ,                          Physical Examination: General appearance - alert, well appearing, and in no distress                                      Abdomen - FH 38 ,                                                         -FHR 146                                                         soft, nontender, nondistended, no masses or organomegaly                                      Pelvic - 1 cm, 50% thinned out                                            Questions were answered. Assessment: LROB G2P1001 @ 2324w6d Estimated Date of Delivery: 10/17/17  Plan:  Continued routine obstetrical care, LROB, NST at 40.5 weeks, IOL at 41 weeks if needed.  F/u in 1 weeks for LROB, cervix check   By signing my name below, I, Tiffany Goodwin, attest that this documentation has been prepared under the direction and in the presence of Tilda BurrowFerguson, Telitha Plath V, MD. Electronically Signed: Redge GainerIzna Goodwin, Medical Scribe. 10/09/17. 1:44 PM.  `````Attestation of Attending Supervision of Advanced Practitioner: Evaluation and management procedures were performed by the PA/NP/CNM/OB Fellow under my supervision/collaboration. Chart reviewed and agree with management and plan.  Tilda BurrowJohn V Raymont Andreoni 10/09/2017 2:06 PM

## 2017-10-12 ENCOUNTER — Other Ambulatory Visit: Payer: Self-pay

## 2017-10-12 ENCOUNTER — Encounter (HOSPITAL_COMMUNITY): Payer: Self-pay | Admitting: *Deleted

## 2017-10-12 ENCOUNTER — Inpatient Hospital Stay (HOSPITAL_COMMUNITY)
Admission: AD | Admit: 2017-10-12 | Discharge: 2017-10-12 | Disposition: A | Discharge status: Home / Self Care | Payer: Medicaid Other | Source: Ambulatory Visit | Attending: Obstetrics and Gynecology | Admitting: Obstetrics and Gynecology

## 2017-10-12 DIAGNOSIS — O471 False labor at or after 37 completed weeks of gestation: Secondary | ICD-10-CM

## 2017-10-12 DIAGNOSIS — Z3403 Encounter for supervision of normal first pregnancy, third trimester: Secondary | ICD-10-CM

## 2017-10-12 DIAGNOSIS — Z3A00 Weeks of gestation of pregnancy not specified: Secondary | ICD-10-CM | POA: Insufficient documentation

## 2017-10-12 NOTE — MAU Note (Signed)
Urine sent to lab 

## 2017-10-12 NOTE — Discharge Instructions (Signed)

## 2017-10-12 NOTE — MAU Note (Signed)
Pt reports back pain and irregular contractions throughout the day. + Fm. Denies LOF or bleeding.

## 2017-10-13 NOTE — MAU Note (Signed)
I have communicated with Pincus BadderK Shaw CNM and reviewed vital signs:  Vitals:   10/12/17 1927 10/12/17 1942  BP: 138/79 139/80  Pulse: 71 71  Resp: 18   Temp: 97.9 F (36.6 C)     Vaginal exam:  Dilation: 1 Effacement (%): 50 Cervical Position: Middle Station: -2 Presentation: Vertex Exam by:: B Kimothy Kishimoto RN,   Also reviewed contraction pattern and that non-stress test is reactive.  It has been documented that patient is contracting every 7-9 minutes with no cervical change since her last appointment, not indicating active labor.  Patient denies any other complaints.  Based on this report provider has given order for discharge.  A discharge order and diagnosis entered by a provider.   Labor discharge instructions reviewed with patient.

## 2017-10-17 ENCOUNTER — Ambulatory Visit (INDEPENDENT_AMBULATORY_CARE_PROVIDER_SITE_OTHER): Payer: Medicaid Other | Admitting: Women's Health

## 2017-10-17 ENCOUNTER — Encounter: Payer: Self-pay | Admitting: Women's Health

## 2017-10-17 VITALS — BP 130/70 | HR 97 | Wt 171.0 lb

## 2017-10-17 DIAGNOSIS — Z331 Pregnant state, incidental: Secondary | ICD-10-CM

## 2017-10-17 DIAGNOSIS — O480 Post-term pregnancy: Secondary | ICD-10-CM | POA: Diagnosis not present

## 2017-10-17 DIAGNOSIS — Z3403 Encounter for supervision of normal first pregnancy, third trimester: Secondary | ICD-10-CM

## 2017-10-17 DIAGNOSIS — Z1389 Encounter for screening for other disorder: Secondary | ICD-10-CM

## 2017-10-17 LAB — POCT URINALYSIS DIPSTICK
Glucose, UA: NEGATIVE
KETONES UA: NEGATIVE
LEUKOCYTES UA: NEGATIVE
NITRITE UA: NEGATIVE
PROTEIN UA: NEGATIVE
RBC UA: NEGATIVE

## 2017-10-17 NOTE — Treatment Plan (Signed)
Induction Assessment Scheduling Form Fax to [facility]:  [phone]  Tiffany Goodwin                                                                                   DOB:  02/18/1991                                                            MRN:  [id number]                                                                     Phone #:   [phone]                         Provider:  [facility]  GP:  [id number]                                                            Estimated Date of Delivery: 10/17/17  Dating Criteria: 7wk u/s    Medical Indications for induction:  postdates Admission Date/Time:  12/6 @ 0730 Gestational age on admission:  41.0   Filed Weights   10/17/17 1210  Weight: 171 lb (77.6 kg)   HIV:   neg GBS: Negative (11/07 1545)  SVE: 1.5/th/-2, vtx   Method of induction(proposed):  Foley bulb and cytotec   Scheduling Provider Signature:  Tiffany Goodwin, Tiffany Goodwin, [place]                                            Today's Date:  10/17/2017

## 2017-10-17 NOTE — Progress Notes (Signed)
   LOW-RISK PREGNANCY VISIT Patient name: Tiffany Goodwin MRN [id number]  Date of birth: 05/09/1991 Chief Complaint:   Routine Prenatal Visit  History of Present Illness:   Tiffany Goodwin is a 26 y.o. 652P1001 female at 8815w0d with an Estimated Date of Delivery: 10/17/17 being seen today for ongoing management of a low-risk pregnancy.  Today she reports thinks she lost mucous plug the other day. Her father is asking if she can be induced today d/t [place]-discussed there is currently no medical reason and traveling long distances 1wk pp not advised d/t immediate pp period, r/f DVT/PE, newborn, etc. Discussed w/ pt potential for membrane sweeping if cx favorable- she is not sure she wants to do.  Contractions: Irregular.  .  Movement: Present. denies leaking of fluid. Review of Systems:   Pertinent items are noted in HPI Denies abnormal vaginal discharge w/ itching/odor/irritation, headaches, visual changes, shortness of breath, chest pain, abdominal pain, severe nausea/vomiting, or problems with urination or bowel movements unless otherwise stated above. Pertinent History Reviewed:  Reviewed past medical,surgical, social, obstetrical and family history.  Reviewed problem list, medications and allergies. Physical Assessment:   Vitals:   10/17/17 1210  BP: 130/70  Pulse: 97  Weight: 171 lb (77.6 kg)  Body mass index is 26.78 kg/m.        Physical Examination:   General appearance: Well appearing, and in no distress  Mental status: Alert, oriented to person, place, and time  Skin: Warm & dry  Cardiovascular: Normal heart rate noted  Respiratory: Normal respiratory effort, no distress  Abdomen: Soft, gravid, nontender  Pelvic: Cervical exam performed  Dilation: 1.5 Effacement (%): Thick Station: -2  Cx not favorable, offered membrane sweeping anyway- pt declined  Extremities: Edema: None  Fetal Status: Fetal Heart Rate (bpm): 142 Fundal Height: 39 cm Movement:  Present Presentation: Vertex  Results for orders placed or performed in visit on 10/17/17 (from the past 24 hour(s))  POCT urinalysis dipstick   Collection Time: 10/17/17 12:14 PM  Result Value Ref Range   Color, UA     Clarity, UA     Glucose, UA neg    Bilirubin, UA     Ketones, UA neg    Spec Grav, UA  1.010 - 1.025   Blood, UA neg    pH, UA  5.0 - 8.0   Protein, UA neg    Urobilinogen, UA  0.2 or 1.0 E.U./dL   Nitrite, UA neg    Leukocytes, UA Negative Negative    Assessment & Plan:  1) Low-risk pregnancy G2P1001 at 4115w0d with an Estimated Date of Delivery: 10/17/17   2) Postdates, will get bpp u/s Mon, IOL scheduled for 41.0wks on 12/6 @ 0730, IOL form faxed via Epic and orders placed   Labs/procedures today: sve  Plan:  Continue routine obstetrical care   Reviewed: Term labor symptoms and general obstetric precautions including but not limited to vaginal bleeding, contractions, leaking of fluid and fetal movement were reviewed in detail with the patient.  All questions were answered  [facility]  Orders Placed This Encounter  Procedures  . US FETAL BPP WO NON STRESS  . POCT urinalysis dipstick   Marge DuncansBooker, Kresha Abelson Randall CNM, [facility]NP-BC 10/17/2017 12:49 PM

## 2017-10-17 NOTE — Patient Instructions (Signed)
Tiffany Goodwin, I greatly value your feedback.  If you receive a survey following your visit with us today, we appreciate you taking the time to fill it out.  Thanks, Tiffany Goodwin, CNM, WHNP-BC   Call the office ([phone]) or go to [facility] if:  You begin to have strong, frequent contractions  Your water breaks.  Sometimes it is a big gush of fluid, sometimes it is just a trickle that keeps getting your panties wet or running down your legs  You have vaginal bleeding.  It is normal to have a small amount of spotting if your cervix was checked.   You don't feel your baby moving like normal.  If you don't, get you something to eat and drink and lay down and focus on feeling your baby move.  You should feel at least 10 movements in 2 hours.  If you don't, you should call the office or go to [facility].    Braxton Hicks Contractions Contractions of the uterus can occur throughout pregnancy, but they are not always a sign that you are in labor. You may have practice contractions called Braxton Hicks contractions. These false labor contractions are sometimes confused with true labor. What are Braxton Hicks contractions? Braxton Hicks contractions are tightening movements that occur in the muscles of the uterus before labor. Unlike true labor contractions, these contractions do not result in opening (dilation) and thinning of the cervix. Toward the end of pregnancy (32-34 weeks), Braxton Hicks contractions can happen more often and may become stronger. These contractions are sometimes difficult to tell apart from true labor because they can be very uncomfortable. You should not feel embarrassed if you go to the hospital with false labor. Sometimes, the only way to tell if you are in true labor is for your health care provider to look for changes in the cervix. The health care provider will do a physical exam and may monitor your contractions. If you are not in true labor, the exam should show  that your cervix is not dilating and your water has not broken. If there are no prenatal problems or other health problems associated with your pregnancy, it is completely safe for you to be sent home with false labor. You may continue to have Braxton Hicks contractions until you go into true labor. How can I tell the difference between true labor and false labor?  Differences ? False labor ? Contractions last 30-70 seconds.: Contractions are usually shorter and not as strong as true labor contractions. ? Contractions become very regular.: Contractions are usually irregular. ? Discomfort is usually felt in the top of the uterus, and it spreads to the lower abdomen and low back.: Contractions are often felt in the front of the lower abdomen and in the groin. ? Contractions do not go away with walking.: Contractions may go away when you walk around or change positions while lying down. ? Contractions usually become more intense and increase in frequency.: Contractions get weaker and are shorter-lasting as time goes on. ? The cervix dilates and gets thinner.: The cervix usually does not dilate or become thin. Follow these instructions at home:  Take over-the-counter and prescription medicines only as told by your health care provider.  Keep up with your usual exercises and follow other instructions from your health care provider.  Eat and drink lightly if you think you are going into labor.  If Braxton Hicks contractions are making you uncomfortable: ? Change your position from lying down   lying down or resting to walking, or change from walking to resting. ? Sit and rest in a tub of warm water. ? Drink enough fluid to keep your urine clear or pale yellow. Dehydration may cause these contractions. ? Do slow and deep breathing several times an hour.  Keep all follow-up prenatal visits as told by your health care provider. This is important. Contact a health care provider if:  You have a  fever.  You have continuous pain in your abdomen. Get help right away if:  Your contractions become stronger, more regular, and closer together.  You have fluid leaking or gushing from your vagina.  You pass blood-tinged mucus (bloody show).  You have bleeding from your vagina.  You have low back pain that you never had before.  You feel your baby's head pushing down and causing pelvic pressure.  Your baby is not moving inside you as much as it used to. Summary  Contractions that occur before labor are called Braxton Hicks contractions, false labor, or practice contractions.  Braxton Hicks contractions are usually shorter, weaker, farther apart, and less regular than true labor contractions. True labor contractions usually become progressively stronger and regular and they become more frequent.  Manage discomfort from [facility] contractions by changing position, resting in a warm bath, drinking plenty of water, or practicing deep breathing. This information is not intended to replace advice given to you by your health care provider. Make sure you discuss any questions you have with your health care provider. Document Released: 11/05/2005 Document Revised: 09/24/2016 Document Reviewed: 09/24/2016 Elsevier Interactive Patient Education  2017 [employer].

## 2017-10-18 ENCOUNTER — Telehealth (HOSPITAL_COMMUNITY): Payer: Self-pay | Admitting: *Deleted

## 2017-10-18 NOTE — Telephone Encounter (Signed)
Preadmission screen  

## 2017-10-19 ENCOUNTER — Encounter (HOSPITAL_COMMUNITY): Payer: Self-pay | Admitting: Certified Registered Nurse Anesthetist

## 2017-10-19 ENCOUNTER — Other Ambulatory Visit: Payer: Self-pay

## 2017-10-19 ENCOUNTER — Inpatient Hospital Stay (HOSPITAL_COMMUNITY)
Admission: AD | Admit: 2017-10-19 | Discharge: 2017-10-21 | DRG: 807 | Disposition: A | Discharge status: Home / Self Care | Payer: Medicaid Other | Source: Ambulatory Visit | Attending: Obstetrics and Gynecology | Admitting: Obstetrics and Gynecology

## 2017-10-19 ENCOUNTER — Inpatient Hospital Stay (HOSPITAL_COMMUNITY): Payer: Medicaid Other | Admitting: Anesthesiology

## 2017-10-19 ENCOUNTER — Encounter (HOSPITAL_COMMUNITY): Payer: Self-pay

## 2017-10-19 DIAGNOSIS — O322XX0 Maternal care for transverse and oblique lie, not applicable or unspecified: Secondary | ICD-10-CM | POA: Diagnosis present

## 2017-10-19 DIAGNOSIS — O480 Post-term pregnancy: Secondary | ICD-10-CM | POA: Diagnosis present

## 2017-10-19 DIAGNOSIS — Z3403 Encounter for supervision of normal first pregnancy, third trimester: Secondary | ICD-10-CM

## 2017-10-19 DIAGNOSIS — Z3A40 40 weeks gestation of pregnancy: Secondary | ICD-10-CM

## 2017-10-19 LAB — POCT FERN TEST: POCT FERN TEST: POSITIVE

## 2017-10-19 LAB — TYPE AND SCREEN
ABO/RH(D): A POS
Antibody Screen: NEGATIVE

## 2017-10-19 LAB — CBC
HCT: 33 % — ABNORMAL LOW (ref 36.0–46.0)
Hemoglobin: 11.3 g/dL — ABNORMAL LOW (ref 12.0–15.0)
MCH: 31.5 pg (ref 26.0–34.0)
MCHC: 34.2 g/dL (ref 30.0–36.0)
MCV: 91.9 fL (ref 78.0–100.0)
PLATELETS: 298 10*3/uL (ref 150–400)
RBC: 3.59 MIL/uL — ABNORMAL LOW (ref 3.87–5.11)
RDW: 14.3 % (ref 11.5–15.5)
WBC: 15.2 10*3/uL — ABNORMAL HIGH (ref 4.0–10.5)

## 2017-10-19 LAB — ABO/RH: ABO/RH(D): A POS

## 2017-10-19 LAB — RPR: RPR Ser Ql: NONREACTIVE

## 2017-10-19 MED ORDER — FLEET ENEMA 7-19 GM/118ML RE ENEM: 1.0000 | ENEMA | RECTAL | Status: DC | PRN

## 2017-10-19 MED ORDER — EPHEDRINE 5 MG/ML INJ
10.0000 mg | INTRAVENOUS | Status: DC | PRN
  Filled 2017-10-19: qty 2

## 2017-10-19 MED ORDER — LACTATED RINGERS IV SOLN
INTRAVENOUS | Status: DC
  Administered 2017-10-19 (×4): via INTRAVENOUS

## 2017-10-19 MED ORDER — LACTATED RINGERS IV SOLN
500.0000 mL | Freq: Once | INTRAVENOUS | Status: AC
  Administered 2017-10-19: 500 mL via INTRAVENOUS

## 2017-10-19 MED ORDER — DIPHENHYDRAMINE HCL 25 MG PO CAPS: 25.0000 mg | ORAL_CAPSULE | Freq: Four times a day (QID) | ORAL | Status: DC | PRN

## 2017-10-19 MED ORDER — FENTANYL 2.5 MCG/ML BUPIVACAINE 1/10 % EPIDURAL INFUSION (WH - ANES)
14.0000 mL/h | INTRAMUSCULAR | Status: DC | PRN
  Administered 2017-10-19 (×3): 14 mL/h via EPIDURAL
  Filled 2017-10-19 (×3): qty 100

## 2017-10-19 MED ORDER — PHENYLEPHRINE 40 MCG/ML (10ML) SYRINGE FOR IV PUSH (FOR BLOOD PRESSURE SUPPORT)
80.0000 ug | PREFILLED_SYRINGE | INTRAVENOUS | Status: DC | PRN
  Filled 2017-10-19: qty 5

## 2017-10-19 MED ORDER — OXYTOCIN 40 UNITS IN LACTATED RINGERS INFUSION - SIMPLE MED
1.0000 m[IU]/min | INTRAVENOUS | Status: DC
  Administered 2017-10-19: 2 m[IU]/min via INTRAVENOUS

## 2017-10-19 MED ORDER — BENZOCAINE-MENTHOL 20-0.5 % EX AERO: 1.0000 "application " | INHALATION_SPRAY | CUTANEOUS | Status: DC | PRN

## 2017-10-19 MED ORDER — PHENYLEPHRINE 40 MCG/ML (10ML) SYRINGE FOR IV PUSH (FOR BLOOD PRESSURE SUPPORT)
80.0000 ug | PREFILLED_SYRINGE | INTRAVENOUS | Status: DC | PRN
  Filled 2017-10-19: qty 5
  Filled 2017-10-19: qty 10

## 2017-10-19 MED ORDER — DIPHENHYDRAMINE HCL 50 MG/ML IJ SOLN: 12.5000 mg | INTRAMUSCULAR | Status: DC | PRN

## 2017-10-19 MED ORDER — LIDOCAINE HCL (PF) 1 % IJ SOLN
30.0000 mL | INTRAMUSCULAR | Status: DC | PRN
  Filled 2017-10-19: qty 30

## 2017-10-19 MED ORDER — IBUPROFEN 600 MG PO TABS
600.0000 mg | ORAL_TABLET | Freq: Four times a day (QID) | ORAL | Status: DC
  Administered 2017-10-20 – 2017-10-21 (×7): 600 mg via ORAL
  Filled 2017-10-19 (×7): qty 1

## 2017-10-19 MED ORDER — PRENATAL MULTIVITAMIN CH
1.0000 | ORAL_TABLET | Freq: Every day | ORAL | Status: DC
  Administered 2017-10-20 – 2017-10-21 (×2): 1 via ORAL
  Filled 2017-10-19 (×2): qty 1

## 2017-10-19 MED ORDER — LACTATED RINGERS IV SOLN
500.0000 mL | INTRAVENOUS | Status: DC | PRN
  Administered 2017-10-19 (×2): 500 mL via INTRAVENOUS

## 2017-10-19 MED ORDER — OXYCODONE-ACETAMINOPHEN 5-325 MG PO TABS: 1.0000 | ORAL_TABLET | ORAL | Status: DC | PRN

## 2017-10-19 MED ORDER — ACETAMINOPHEN 325 MG PO TABS
650.0000 mg | ORAL_TABLET | ORAL | Status: DC | PRN
  Administered 2017-10-20: 650 mg via ORAL
  Filled 2017-10-19: qty 2

## 2017-10-19 MED ORDER — TETANUS-DIPHTH-ACELL PERTUSSIS 5-2.5-18.5 LF-MCG/0.5 IM SUSP: 0.5000 mL | Freq: Once | INTRAMUSCULAR | Status: DC

## 2017-10-19 MED ORDER — OXYTOCIN 40 UNITS IN LACTATED RINGERS INFUSION - SIMPLE MED
2.5000 [IU]/h | INTRAVENOUS | Status: DC
  Filled 2017-10-19: qty 1000

## 2017-10-19 MED ORDER — SOD CITRATE-CITRIC ACID 500-334 MG/5ML PO SOLN
30.0000 mL | ORAL | Status: DC | PRN
  Administered 2017-10-19: 30 mL via ORAL
  Filled 2017-10-19: qty 15

## 2017-10-19 MED ORDER — HYDROXYZINE HCL 50 MG PO TABS
50.0000 mg | ORAL_TABLET | Freq: Four times a day (QID) | ORAL | Status: DC | PRN
  Filled 2017-10-19: qty 1

## 2017-10-19 MED ORDER — DIBUCAINE 1 % RE OINT
1.0000 "application " | TOPICAL_OINTMENT | RECTAL | Status: DC | PRN
  Administered 2017-10-20: 1 via RECTAL
  Filled 2017-10-19: qty 28

## 2017-10-19 MED ORDER — MISOPROSTOL 25 MCG QUARTER TABLET: 25.0000 ug | ORAL_TABLET | ORAL | Status: DC | PRN

## 2017-10-19 MED ORDER — LIDOCAINE HCL (PF) 1 % IJ SOLN
INTRAMUSCULAR | Status: DC | PRN
  Administered 2017-10-19 (×2): 5 mL via EPIDURAL

## 2017-10-19 MED ORDER — TERBUTALINE SULFATE 1 MG/ML IJ SOLN
0.2500 mg | Freq: Once | INTRAMUSCULAR | Status: DC | PRN
  Filled 2017-10-19: qty 1

## 2017-10-19 MED ORDER — WITCH HAZEL-GLYCERIN EX PADS
1.0000 "application " | MEDICATED_PAD | CUTANEOUS | Status: DC | PRN
  Administered 2017-10-20: 1 via TOPICAL

## 2017-10-19 MED ORDER — COCONUT OIL OIL: 1.0000 "application " | TOPICAL_OIL | Status: DC | PRN

## 2017-10-19 MED ORDER — PHENYLEPHRINE 40 MCG/ML (10ML) SYRINGE FOR IV PUSH (FOR BLOOD PRESSURE SUPPORT)
80.0000 ug | PREFILLED_SYRINGE | INTRAVENOUS | Status: DC | PRN
  Administered 2017-10-19: 80 ug via INTRAVENOUS
  Filled 2017-10-19: qty 5

## 2017-10-19 MED ORDER — MISOPROSTOL 200 MCG PO TABS
50.0000 ug | ORAL_TABLET | ORAL | Status: DC | PRN
  Administered 2017-10-19 (×2): 50 ug via ORAL
  Filled 2017-10-19 (×2): qty 1

## 2017-10-19 MED ORDER — OXYTOCIN BOLUS FROM INFUSION
500.0000 mL | Freq: Once | INTRAVENOUS | Status: AC
  Administered 2017-10-19: 500 mL/h via INTRAVENOUS

## 2017-10-19 MED ORDER — SIMETHICONE 80 MG PO CHEW: 80.0000 mg | CHEWABLE_TABLET | ORAL | Status: DC | PRN

## 2017-10-19 MED ORDER — FENTANYL CITRATE (PF) 100 MCG/2ML IJ SOLN
50.0000 ug | INTRAMUSCULAR | Status: DC | PRN
  Administered 2017-10-19 (×3): 100 ug via INTRAVENOUS
  Administered 2017-10-19: 50 ug via INTRAVENOUS
  Filled 2017-10-19 (×4): qty 2

## 2017-10-19 MED ORDER — ACETAMINOPHEN 325 MG PO TABS
650.0000 mg | ORAL_TABLET | ORAL | Status: DC | PRN
Start: 2017-10-19 — End: 2017-10-19
  Administered 2017-10-19: 650 mg via ORAL
  Filled 2017-10-19: qty 2

## 2017-10-19 MED ORDER — ZOLPIDEM TARTRATE 5 MG PO TABS: 5.0000 mg | ORAL_TABLET | Freq: Every evening | ORAL | Status: DC | PRN

## 2017-10-19 MED ORDER — OXYCODONE-ACETAMINOPHEN 5-325 MG PO TABS: 2.0000 | ORAL_TABLET | ORAL | Status: DC | PRN

## 2017-10-19 MED ORDER — ONDANSETRON HCL 4 MG/2ML IJ SOLN: 4.0000 mg | INTRAMUSCULAR | Status: DC | PRN

## 2017-10-19 MED ORDER — SENNOSIDES-DOCUSATE SODIUM 8.6-50 MG PO TABS
2.0000 | ORAL_TABLET | ORAL | Status: DC
  Administered 2017-10-20 (×2): 2 via ORAL
  Filled 2017-10-19 (×2): qty 2

## 2017-10-19 MED ORDER — ONDANSETRON HCL 4 MG/2ML IJ SOLN: 4.0000 mg | Freq: Four times a day (QID) | INTRAMUSCULAR | Status: DC | PRN

## 2017-10-19 MED ORDER — ONDANSETRON HCL 4 MG PO TABS: 4.0000 mg | ORAL_TABLET | ORAL | Status: DC | PRN

## 2017-10-19 NOTE — Progress Notes (Signed)
LABOR PROGRESS NOTE  Tiffany Goodwin is a 26 y.o. G2P1001 at 8664w2d  admitted for SROM   Subjective: Patient states contractions are starting to get painful- breathing through some contractions, request pain medication   Objective: BP 131/69   Pulse 78   Temp 98 F (36.7 C) (Oral)   Resp 16   Ht 5\' 7"  (1.702 m)   Wt 171 lb (77.6 kg)   LMP 01/04/2017   BMI 26.78 kg/m  or  Vitals:   10/19/17 0043 10/19/17 0124 10/19/17 0131  BP: (!) 153/88  131/69  Pulse: 74  78  Resp: 18  16  Temp: 97.7 F (36.5 C)  98 F (36.7 C)  TempSrc:   Oral  Weight:  171 lb (77.6 kg)   Height:  5\' 7"  (1.702 m)     Dilation: 1.5 Effacement (%): Thick Cervical Position: Middle Station: -2 Presentation: Vertex Exam by:: Hermenia BersMichelle Kelly, RN FHT: baseline rate 130, moderate varibility, +acel, no decel Toco: 2-4  Labs: Lab Results  Component Value Date   WBC 15.2 (H) 10/19/2017   HGB 11.3 (L) 10/19/2017   HCT 33.0 (L) 10/19/2017   MCV 91.9 10/19/2017   PLT 298 10/19/2017    Patient Active Problem List   Diagnosis Date Noted  . Post-dates pregnancy 10/19/2017  . Spider vein of right lower extremity 09/04/2017  . Supervision of normal pregnancy 03/11/2017    Assessment / Plan: 26 y.o. G2P1001 at 4364w2d here for SROM  Labor: Oral cytotec started for augmentation  Fetal Wellbeing:  Cat 1 Pain Control:  IV Fentanyl  Anticipated MOD:  SVD  Sharyon CableRogers, Kinjal Neitzke C, CNM 10/19/2017, 4:51 AM

## 2017-10-19 NOTE — Progress Notes (Signed)
Steward DroneVeronica Rogers CNM on unit and aware of pt's admission and status. Aware of SROM at 2400, clear fld, sve, GBS negative. [facility]

## 2017-10-19 NOTE — Anesthesia Preprocedure Evaluation (Signed)
Anesthesia Evaluation  Patient identified by MRN, date of birth, ID band Patient awake    Reviewed: Allergy & Precautions, H&P , NPO status , Patient's Chart, lab work & pertinent test results  Airway Mallampati: II   Neck ROM: full    Dental   Pulmonary    breath sounds clear to auscultation       Cardiovascular + Peripheral Vascular Disease   Rhythm:regular Rate:Normal     Neuro/Psych PSYCHIATRIC DISORDERS Anxiety    GI/Hepatic   Endo/Other    Renal/GU      Musculoskeletal   Abdominal   Peds  Hematology   Anesthesia Other Findings   Reproductive/Obstetrics (+) Pregnancy                             Anesthesia Physical Anesthesia Plan  ASA: II  Anesthesia Plan: Epidural   Post-op Pain Management:    Induction: Intravenous  PONV Risk Score and Plan: 2 and Treatment may vary due to age or medical condition  Airway Management Planned: Natural Airway  Additional Equipment:   Intra-op Plan:   Post-operative Plan:   Informed Consent: I have reviewed the patients History and Physical, chart, labs and discussed the procedure including the risks, benefits and alternatives for the proposed anesthesia with the patient or authorized representative who has indicated his/her understanding and acceptance.     Plan Discussed with: CRNA, Anesthesiologist and Surgeon  Anesthesia Plan Comments:         Anesthesia Quick Evaluation

## 2017-10-19 NOTE — Progress Notes (Signed)
Patient ID: Tiffany Goodwin, female   DOB: 07/26/1991, 26 y.o.   MRN: [id number] S: Doing well, pain minimally controlled with Fentanyl x 3 doses. Requesting an epidural. Some pelvic pressure intermittently with contractions.   O: Vitals:   10/19/17 0131 10/19/17 0550 10/19/17 0753 10/19/17 0910  BP: 131/69 122/71 130/77 117/72  Pulse: 78 75 70 70  Resp: 16 18 18    Temp: 98 F (36.7 C) 98.2 F (36.8 C) 99.6 F (37.6 C)   TempSrc: Oral Oral Oral   Weight:      Height:         FHT:  FHR: 140 bpm, variability: moderate,  accelerations:  Abscent,  decelerations:  Variables present UC:   regular, every 1-4 minutes SVE:   Dilation: 2.5 Effacement (%): 80 Station: -2 Exam by:: Tiffany Goodwin, CNM Foley Balloon inserted without difficulty - pt tolerated well Small amount of clear fluid  A / P: Spontaneous labor, progressing normally SROM @ 2345 11/30  Fetal Wellbeing:  Category I Pain Control:  IV pain meds; requesting epidural  Anticipated MOD:  NSVD  Tiffany Moraolitta Saavi Mceachron, MSN, CNM 10/19/2017, 10:41 AM

## 2017-10-19 NOTE — Anesthesia Pain Management Evaluation Note (Signed)
  CRNA Pain Management Visit Note  Patient: Tiffany Goodwin, 26 y.o., female  "[facility] We have an anesthesia team available at all times to provide care throughout the hospital, including epidural management and anesthesia for C-section. I don't know your plan for the delivery whether it a natural birth, water birth, IV sedation, nitrous supplementation, doula or epidural, but we want to meet your pain goals."   1.Was your pain managed to your expectations on prior hospitalizations?   Yes   2.What is your expectation for pain management during this hospitalization?     Epidural  3.How can we help you reach that goal?prn support   Record the patient's initial score and the patient's pain goal.   Pain: 7  Pain Goal: 3 [facility] wants you to be able to say your pain was always managed very well.  Tiffany Goodwin 10/19/2017

## 2017-10-19 NOTE — Progress Notes (Signed)
Kaely Canary Brim Cicero is a 26 y.o. G2P1001 at 7854w2d here for SROM.  Subjective: Patient feeling unwell, nauseous. IUPC inserted.   Objective: BP (!) 104/47   Pulse 75   Temp 97.9 F (36.6 C) (Oral)   Resp 14   Ht 5\' 7"  (1.702 m)   Wt 77.6 kg (171 lb)   LMP 01/04/2017   SpO2 100%   BMI 26.78 kg/m    FHT:  FHR: 125 bpm, variability: moderate,  accelerations: 15x15,  decelerations:  early UC:   Q 1-3 minutes, 40-80 Dilation: 5 Effacement (%): 60 Cervical Position: Middle Station: -2 Presentation: Vertex Exam by:: Arne ClevelandJennifer Hazelwood, RN  Labs: Results for orders placed or performed during the hospital encounter of 10/19/17 (from the past 24 hour(s))  Fern Test     Status: Normal   Collection Time: 10/19/17 12:40 AM  Result Value Ref Range   POCT Fern Test Positive = ruptured amniotic membanes   CBC     Status: Abnormal   Collection Time: 10/19/17 12:54 AM  Result Value Ref Range   WBC 15.2 (H) 4.0 - 10.5 K/uL   RBC 3.59 (L) 3.87 - 5.11 MIL/uL   Hemoglobin 11.3 (L) 12.0 - 15.0 g/dL   HCT 40.933.0 (L) 81.136.0 - 91.446.0 %   MCV 91.9 78.0 - 100.0 fL   MCH 31.5 26.0 - 34.0 pg   MCHC 34.2 30.0 - 36.0 g/dL   RDW 78.214.3 95.611.5 - 21.315.5 %   Platelets 298 150 - 400 K/uL  RPR     Status: None   Collection Time: 10/19/17 12:54 AM  Result Value Ref Range   RPR Ser Ql Non Reactive Non Reactive  Type and screen     Status: None   Collection Time: 10/19/17 12:54 AM  Result Value Ref Range   ABO/RH(D) A POS    Antibody Screen NEG    Sample Expiration 10/22/2017   ABO/Rh     Status: None   Collection Time: 10/19/17 12:54 AM  Result Value Ref Range   ABO/RH(D) A POS    Ingrid Canary Brim Cervantes is a 26 y.o. G2P1001 at 6954w2d here for SROM.   Assessment / Plan: 2354w2d week IUP Labor:IUPC placed, labor continuing to progress Fetal Wellbeing:  Category 1 Pain Control:  epidural Anticipated MOD:  NSVD  Francie MassingBrown, Seth Higginbotham, Medical Student 10/19/2017 4:20 PM

## 2017-10-19 NOTE — MAU Note (Signed)
Pt stated she was sleeping and started feeling something coming out" and feeling wet. Reports some cramping. Good fetal movement reported.

## 2017-10-19 NOTE — H&P (Signed)
LABOR AND DELIVERY ADMISSION HISTORY AND PHYSICAL NOTE  Tiffany Goodwin is a 26 y.o. female G2P1001 with IUP at 3356w2d by U/S presenting for SROM @ midnight.  She reports positive fetal movement. She denies vaginal bleeding.  Prenatal History/Complications: PNC at [facility] since 8weeks  Pregnancy complications: none  Past Medical History: Past Medical History:  Diagnosis Date  . Anxiety   . BV (bacterial vaginosis) 10/21/2014  . Itching in the vaginal area 12/07/2015  . LLQ pain 04/19/2015  . Missed period 04/19/2015  . Patient desires pregnancy 12/07/2015  . Pelvic pain in female 11/18/2014  . Pneumonia 2016,january   left side  . Screening for STD (sexually transmitted disease) 01/20/2015  . Vaginal discharge 10/21/2014    Past Surgical History: Past Surgical History:  Procedure Laterality Date  . TONSILLECTOMY    . WISDOM TOOTH EXTRACTION    . WISDOM TOOTH EXTRACTION      Obstetrical History: OB History    Gravida Para Term Preterm AB Living   2 1 1  0 0 1   SAB TAB Ectopic Multiple Live Births   0 0 0 0 1      Social History: Social History   Socioeconomic History  . Marital status: Single    Spouse name: None  . Number of children: None  . Years of education: None  . Highest education level: None  Social Needs  . Financial resource strain: None  . Food insecurity - worry: None  . Food insecurity - inability: None  . Transportation needs - medical: None  . Transportation needs - non-medical: None  Occupational History  . None  Tobacco Use  . Smoking status: Never Smoker  . Smokeless tobacco: Never Used  Substance and Sexual Activity  . Alcohol use: No  . Drug use: No  . Sexual activity: Yes    Birth control/protection: None  Other Topics Concern  . None  Social History Narrative  . None    Family History: Family History  Problem Relation Age of Onset  . Heart attack Maternal Grandmother   . Stroke Maternal Grandmother   . Other Paternal Grandfather       brain aneursym  . Cancer Maternal Grandfather        bone,chest,liver,brain,pancreatic,throat  . Anesthesia problems Neg Hx   . Hypotension Neg Hx   . Malignant hyperthermia Neg Hx   . Pseudochol deficiency Neg Hx     Allergies: No Known Allergies  Medications Prior to Admission  Medication Sig Dispense Refill Last Dose  . acetaminophen (TYLENOL) 500 MG tablet Take 500 mg by mouth every 6 (six) hours as needed.   Taking  . Docusate Calcium (STOOL SOFTENER PO) Take by mouth as needed.    Not Taking  . Pediatric Multiple Vit-C-FA (FLINSTONES GUMMIES OMEGA-3 DHA PO) Take by mouth. Takes 2 daily   Taking     Review of Systems  All systems reviewed and negative except as stated in HPI  Physical Exam Blood pressure 131/69, pulse 78, temperature 98 F (36.7 C), temperature source Oral, resp. rate 16, height 5\' 7"  (1.702 m), weight 171 lb (77.6 kg), last menstrual period 01/04/2017. General appearance: alert, cooperative and no distress Lungs: clear to auscultation bilaterally Heart: regular rate and rhythm Abdomen: soft, non-tender; bowel sounds normal Extremities: No calf swelling or tenderness Presentation: cephalic Fetal monitoring: baseline 140/moderate variability/+accels/ no decels  Uterine activity: 3-4 by palpation/ mild-moderate Dilation: 1.5 Effacement (%): Thick Station: -2 Exam by:: K.Wilson,RN  Prenatal labs: ABO,  Rh: --/--/A POS (12/01 0054) Antibody: NEG (12/01 0054) Rubella: 10.10 (04/23 1503) RPR: Non Reactive (09/04 0935)  HBsAg: Negative (04/23 1503)  HIV:   Non Reactive (09/04) GC/Chlamydia: Negative (11/07) GBS: Negative (11/07 1545)  2 hr Glucola: normal Genetic screening:  declined Anatomy US: normal   [facility]  Initiated Care at  8wk  FOB Marylou Fleshertis [place]  Dating By 1st trimester U/S 7wk  Pap 11/18/14 neg  GC/CT Initial:  -/-              36+wks:  -/-  Genetic Screen declined  CF screen declined  Anatomic US Normal female  Flu vaccine  Declined 08/21/17   Tdap Recommended ~ 28wks  Glucose Screen  2 hr normal: 86/107/94  GBS neg  Feed Preference bottle  Contraception coc's  Circumcision Yes, at [facility]  Childbirth Classes declined  [facility]    Prenatal Transfer Tool  Maternal Diabetes: No Genetic Screening: Declined Maternal Ultrasounds/Referrals: Normal Fetal Ultrasounds or other Referrals:  None Maternal Substance Abuse:  No Significant Maternal Medications:  None Significant Maternal Lab Results: Lab values include: Group B Strep negative  Results for orders placed or performed during the hospital encounter of 10/19/17 (from the past 24 hour(s))  Fern Test   Collection Time: 10/19/17 12:40 AM  Result Value Ref Range   POCT Fern Test Positive = ruptured amniotic membanes   CBC   Collection Time: 10/19/17 12:54 AM  Result Value Ref Range   WBC 15.2 (H) 4.0 - 10.5 K/uL   RBC 3.59 (L) 3.87 - 5.11 MIL/uL   Hemoglobin 11.3 (L) 12.0 - 15.0 g/dL   HCT 86.533.0 (L) 78.436.0 - 69.646.0 %   MCV 91.9 78.0 - 100.0 fL   MCH 31.5 26.0 - 34.0 pg   MCHC 34.2 30.0 - 36.0 g/dL   RDW 29.514.3 28.411.5 - 13.215.5 %   Platelets 298 150 - 400 K/uL  Type and screen   Collection Time: 10/19/17 12:54 AM  Result Value Ref Range   ABO/RH(D) A POS    Antibody Screen NEG    Sample Expiration 10/22/2017     Patient Active Problem List   Diagnosis Date Noted  . Post-dates pregnancy 10/19/2017  . Spider vein of right lower extremity 09/04/2017  . Supervision of normal pregnancy 03/11/2017    Assessment: Tiffany Goodwin is a 26 y.o. G2P1001 at 2427w2d here for SROM at midnight   #Labor: Expectant management  #Pain: Plans epidural in active labor  #FWB: Cat 1  #ID:  GBS neg  #MOF: Formula #MOC:COC #Circ:  [facility]   Juwaun Inskeep C, CNM 10/19/2017, 2:14 AM

## 2017-10-19 NOTE — Anesthesia Procedure Notes (Signed)
Epidural Patient location during procedure: OB Start time: 10/19/2017 10:43 AM End time: 10/19/2017 10:55 AM  Staffing Anesthesiologist: Achille RichHodierne, Leyton Magoon, MD Performed: anesthesiologist   Preanesthetic Checklist Completed: patient identified, site marked, pre-op evaluation, timeout performed, IV checked, risks and benefits discussed and monitors and equipment checked  Epidural Patient position: sitting Prep: DuraPrep Patient monitoring: heart rate, cardiac monitor, continuous pulse ox and blood pressure Approach: midline Location: L2-L3 Injection technique: LOR saline  Needle:  Needle type: Tuohy  Needle gauge: 17 G Needle length: 9 cm Needle insertion depth: 6 cm Catheter type: closed end flexible Catheter size: 19 Gauge Catheter at skin depth: 12 cm Test dose: negative and Other  Assessment Events: blood not aspirated, injection not painful, no injection resistance and negative IV test  Additional Notes Informed consent obtained prior to proceeding including risk of failure, 1% risk of PDPH, risk of minor discomfort and bruising.  Discussed rare but serious complications including epidural abscess, permanent nerve injury, epidural hematoma.  Discussed alternatives to epidural analgesia and patient desires to proceed.  Timeout performed pre-procedure verifying patient name, procedure, and platelet count.  Patient tolerated procedure well. Reason for block:procedure for pain

## 2017-10-20 NOTE — Progress Notes (Signed)
Post Partum Day 1  Subjective:  Tiffany Goodwin is a 26 y.o. [id number] 2361w2d s/p SVD.  No acute events overnight.  Pt denies problems with ambulating, voiding or po intake.  She denies nausea or vomiting.  Pain is moderately controlled.  Lochia Moderate.  Plan for birth control is oral contraceptives (estrogen/progesterone).  Method of Feeding: Bottle  Objective: BP 124/67 (BP Location: Left Arm)   Pulse 68   Temp 98.4 F (36.9 C) (Oral)   Resp 16   Ht 5\' 7"  (1.702 m)   Wt 77.6 kg (171 lb)   LMP 01/04/2017   SpO2 97%   Breastfeeding? Unknown   BMI 26.78 kg/m   Physical Exam:  General: alert, cooperative and no distress Lochia:normal flow Chest: normal work of breathing Heart: regular rate Abdomen: soft, nontender Uterine Fundus: firm DVT Evaluation: No evidence of DVT seen on physical exam.   Recent Labs    10/19/17 0054  HGB 11.3*  HCT 33.0*    Assessment/Plan:  ASSESSMENT: Tiffany Goodwin is a 26 y.o. G2P2002 4561w2d ppd #1 s/p NSVD doing well.   Plan for discharge tomorrow Continue routine PP care   LOS: 1 day   Caryl AdaJazma Phelps, DO 10/20/2017, 1:53 AM

## 2017-10-20 NOTE — Anesthesia Postprocedure Evaluation (Signed)
Anesthesia Post Note  Patient: Financial plannerAmber N Crossin  Procedure(s) Performed: AN AD HOC LABOR EPIDURAL     Patient location during evaluation: Mother Baby Anesthesia Type: Epidural Level of consciousness: awake and alert and oriented Pain management: pain level controlled Vital Signs Assessment: post-procedure vital signs reviewed and stable Respiratory status: spontaneous breathing and nonlabored ventilation Cardiovascular status: stable Postop Assessment: no headache, adequate PO intake, no backache, epidural receding, patient able to bend at knees and no apparent nausea or vomiting Anesthetic complications: no    Last Vitals:  Vitals:   10/19/17 2316 10/20/17 0512  BP: 124/67 123/67  Pulse: 68 69  Resp: 16 14  Temp: 36.9 C 36.5 C  SpO2:      Last Pain:  Vitals:   10/20/17 0512  TempSrc: Oral  PainSc: 4    Pain Goal: Patients Stated Pain Goal: 4 (10/19/17 0753)               Laban EmperorMalinova,Brittan Butterbaugh Hristova

## 2017-10-21 ENCOUNTER — Other Ambulatory Visit: Payer: Medicaid Other

## 2017-10-21 ENCOUNTER — Encounter: Payer: Medicaid Other | Admitting: Women's Health

## 2017-10-21 MED ORDER — PRENATAL MULTIVITAMIN CH: 1.0000 | ORAL_TABLET | Freq: Every day | ORAL | 2 refills | Status: DC

## 2017-10-21 MED ORDER — SENNOSIDES-DOCUSATE SODIUM 8.6-50 MG PO TABS: 2.0000 | ORAL_TABLET | Freq: Every evening | ORAL | 1 refills | Status: DC | PRN

## 2017-10-21 MED ORDER — IBUPROFEN 600 MG PO TABS: 600.0000 mg | ORAL_TABLET | Freq: Four times a day (QID) | ORAL | 0 refills | Status: DC

## 2017-10-21 NOTE — Progress Notes (Signed)
Rayfield Citizenaroline, CNM said patient was seen and is okay for D/C. Royston CowperIsley, Wilhelmenia Addis E, RN

## 2017-10-21 NOTE — Progress Notes (Signed)
MOB was referred for history of depression/anxiety. * Referral screened out by Clinical Social Worker because none of the following criteria appear to apply: ~ History of anxiety/depression during this pregnancy, or of post-partum depression. ~ Diagnosis of anxiety and/or depression within last 3 years OR * MOB's symptoms currently being treated with medication and/or therapy. Please contact the Clinical Social Worker if needs arise, by MOB request, or if MOB scores greater than 9/yes to question 10 on [place] Postpartum Depression Screen.  Analeah Brame Boyd-Gilyard, MSW, LCSW Clinical Social Work [phone] 

## 2017-10-22 NOTE — Discharge Summary (Signed)
OB Discharge Summary     Patient Name: Tiffany Goodwin DOB: 07/21/1991 MRN: [id number]  Date of admission: 10/19/2017 Delivering MD: Pincus LargePHELPS, Yoav Okane Y   Date of discharge: 10/22/2017  Admitting diagnosis: 40.1wks water broke, pressure Intrauterine pregnancy: 3771w2d     Secondary diagnosis:  Active Problems:   Post-dates pregnancy   SVD (spontaneous vaginal delivery)  Additional problems: None     Discharge diagnosis: Term Pregnancy Delivered                                                                                                Post partum procedures:None  Augmentation: Pitocin  Complications: None  Hospital course:  Onset of Labor With Vaginal Delivery     26 y.o. yo [id number] at 7271w2d was admitted in Latent Labor on 10/19/2017. Patient had an uncomplicated labor course as follows:  Membrane Rupture Time/Date: 12:01 AM ,10/19/2017   Intrapartum Procedures: Episiotomy: None [1]                                         Lacerations:  None [1]  Patient had a delivery of a Viable infant. 10/19/2017  Information for the patient's newborn:  Paschal DoppBiggs, Boy Lindsi [[id number]][[id number]]  Delivery Method: Vaginal, Spontaneous(Filed from Delivery Summary)    Pateint had an uncomplicated postpartum course.  She is ambulating, tolerating a regular diet, passing flatus, and urinating well. Patient is discharged home in stable condition on 10/22/17.   Physical exam  Vitals:   10/20/17 0512 10/20/17 0815 10/20/17 1845 10/21/17 0509  BP: 123/67 111/62 133/81 130/70  Pulse: 69 70 64 67  Resp: 14 18 18 18   Temp: 97.7 F (36.5 C) 98 F (36.7 C) 97.8 F (36.6 C) 97.7 F (36.5 C)  TempSrc: Oral Oral Oral Oral  SpO2:    95%  Weight:      Height:       General: alert, cooperative and no distress Lochia: appropriate Uterine Fundus: firm Incision: N/A DVT Evaluation: No evidence of DVT seen on physical exam. Labs: Lab Results  Component Value Date   WBC 15.2 (H) 10/19/2017   HGB 11.3 (L)  10/19/2017   HCT 33.0 (L) 10/19/2017   MCV 91.9 10/19/2017   PLT 298 10/19/2017   CMP Latest Ref Rng & Units 11/30/2014  Glucose 70 - 99 mg/dL 130(Q112(H)  BUN 6 - 23 mg/dL 6  Creatinine 6.570.50 - 8.461.10 mg/dL 9.620.65  Sodium 952135 - 841145 mmol/L 137  Potassium 3.5 - 5.1 mmol/L 3.4(L)  Chloride 96 - 112 mEq/L 103  CO2 19 - 32 mmol/L 26  Calcium 8.4 - 10.5 mg/dL 9.1  Total Protein 6.0 - 8.3 g/dL -  Total Bilirubin 0.3 - 1.2 mg/dL -  Alkaline Phos 39 - 324117 U/L -  AST 0 - 37 U/L -  ALT 0 - 35 U/L -    Discharge instruction: per After Visit Summary and "Baby and Me Booklet".  After visit meds:  Allergies as of 10/21/2017   No Known  Allergies     Medication List    STOP taking these medications   FLINSTONES GUMMIES OMEGA-3 DHA PO     TAKE these medications   acetaminophen 500 MG tablet Commonly known as:  TYLENOL Take 500 mg by mouth every 6 (six) hours as needed for moderate pain or headache.   calcium carbonate 500 MG chewable tablet Commonly known as:  TUMS - dosed in mg elemental calcium Chew 1 tablet by mouth daily as needed for indigestion or heartburn.   ibuprofen 600 MG tablet Commonly known as:  ADVIL,MOTRIN Take 1 tablet (600 mg total) by mouth every 6 (six) hours.   prenatal multivitamin Tabs tablet Take 1 tablet by mouth daily.   senna-docusate 8.6-50 MG tablet Commonly known as:  Senokot-S Take 2 tablets by mouth at bedtime as needed for mild constipation.       Diet: routine diet  Activity: Advance as tolerated. Pelvic rest for 6 weeks.   Outpatient follow up:4 weeks Follow up Appt: Future Appointments  Date Time Provider Department Center  11/18/2017 10:15 AM Cheral MarkerBooker, Kimberly R, CNM [facility]   Follow up Visit:No Follow-up on file.  Postpartum contraception: Combination OCPs  Newborn Data: Live born female  Birth Weight: 8 lb 7.5 oz (3841 g) APGAR: 6, 9  Newborn Delivery   Birth date/time:  10/19/2017 20:33:00 Delivery type:  Vaginal,  Spontaneous     Baby Feeding: Bottle Disposition:home with mother   10/22/2017 Caryl AdaJazma Fielding Mault, DO

## 2017-10-23 ENCOUNTER — Telehealth: Payer: Self-pay | Admitting: Obstetrics and Gynecology

## 2017-10-23 NOTE — Telephone Encounter (Signed)
Patient called stating she is having bleeding with small clots. Saturating about a pad every 2 hours and having cramping unrelieved by Ibuprofen. Advised patient to continue to monitor bleeding and if gets more than a pad an hour to let us know. Encouraged patient to empty bladder frequently and to try adding Tylenol with the Ibuprofen to help with the cramping. Verbalized understanding.

## 2017-10-23 NOTE — Telephone Encounter (Signed)
Patient called stating that she has a question for on of the provider's either Dr. Despina HiddenEure or Dr. Emelda FearFerguson. Patient did not state the reason for the call. Please contact pt

## 2017-10-24 ENCOUNTER — Inpatient Hospital Stay (HOSPITAL_COMMUNITY): Payer: No Typology Code available for payment source

## 2017-11-18 ENCOUNTER — Other Ambulatory Visit: Payer: Self-pay

## 2017-11-18 ENCOUNTER — Ambulatory Visit (INDEPENDENT_AMBULATORY_CARE_PROVIDER_SITE_OTHER): Payer: Medicaid Other | Admitting: Women's Health

## 2017-11-18 ENCOUNTER — Encounter: Payer: Self-pay | Admitting: Women's Health

## 2017-11-18 DIAGNOSIS — Z3202 Encounter for pregnancy test, result negative: Secondary | ICD-10-CM

## 2017-11-18 LAB — POCT URINE PREGNANCY: PREG TEST UR: NEGATIVE

## 2017-11-18 MED ORDER — NORETHIN-ETH ESTRAD-FE BIPHAS 1 MG-10 MCG / 10 MCG PO TABS
1.0000 | ORAL_TABLET | Freq: Every day | ORAL | 3 refills | Status: DC
Start: 2017-11-18 — End: 2018-11-13

## 2017-11-18 NOTE — Patient Instructions (Signed)
Oral Contraception Use Oral contraceptive pills (OCPs) are medicines taken to prevent pregnancy. OCPs work by preventing the ovaries from releasing eggs. The hormones in OCPs also cause the cervical mucus to thicken, preventing the sperm from entering the uterus. The hormones also cause the uterine lining to become thin, not allowing a fertilized egg to attach to the inside of the uterus. OCPs are highly effective when taken exactly as prescribed. However, OCPs do not prevent sexually transmitted diseases (STDs). Safe sex practices, such as using condoms along with an OCP, can help prevent STDs. Before taking OCPs, you may have a physical exam and Pap test. Your health care provider may also order blood tests if necessary. Your health care provider will make sure you are a good candidate for oral contraception. Discuss with your health care provider the possible side effects of the OCP you may be prescribed. When starting an OCP, it can take 2 to 3 months for the body to adjust to the changes in hormone levels in your body. How to take oral contraceptive pills Your health care provider may advise you on how to start taking the first cycle of OCPs. Otherwise, you can:  Start on day 1 of your menstrual period. You will not need any backup contraceptive protection with this start time.  Start on the first Sunday after your menstrual period or the day you get your prescription. In these cases, you will need to use backup contraceptive protection for the first week.  Start the pill at any time of your cycle. If you take the pill within 5 days of the start of your period, you are protected against pregnancy right away. In this case, you will not need a backup form of birth control. If you start at any other time of your menstrual cycle, you will need to use another form of birth control for 7 days. If your OCP is the type called a minipill, it will protect you from pregnancy after taking it for 2 days (48  hours).  After you have started taking OCPs:  If you forget to take 1 pill, take it as soon as you remember. Take the next pill at the regular time.  If you miss 2 or more pills, call your health care provider because different pills have different instructions for missed doses. Use backup birth control until your next menstrual period starts.  If you use a 28-day pack that contains inactive pills and you miss 1 of the last 7 pills (pills with no hormones), it will not matter. Throw away the rest of the non-hormone pills and start a new pill pack.  No matter which day you start the OCP, you will always start a new pack on that same day of the week. Have an extra pack of OCPs and a backup contraceptive method available in case you miss some pills or lose your OCP pack. Follow these instructions at home:  Do not smoke.  Always use a condom to protect against STDs. OCPs do not protect against STDs.  Use a calendar to mark your menstrual period days.  Read the information and directions that came with your OCP. Talk to your health care provider if you have questions. Contact a health care provider if:  You develop nausea and vomiting.  You have abnormal vaginal discharge or bleeding.  You develop a rash.  You miss your menstrual period.  You are losing your hair.  You need treatment for mood swings or depression.  You   get dizzy when taking the OCP.  You develop acne from taking the OCP.  You become pregnant. Get help right away if:  You develop chest pain.  You develop shortness of breath.  You have an uncontrolled or severe headache.  You develop numbness or slurred speech.  You develop visual problems.  You develop pain, redness, and swelling in the legs. This information is not intended to replace advice given to you by your health care provider. Make sure you discuss any questions you have with your health care provider. Document Released: 10/25/2011 Document  Revised: 04/12/2016 Document Reviewed: 04/26/2013 Elsevier Interactive Patient Education  2017 Elsevier Inc.  

## 2017-11-18 NOTE — Progress Notes (Signed)
   POSTPARTUM VISIT Patient name: Tiffany Leapmber N Ganim MRN [id number]  Date of birth: 12/20/1990 Chief Complaint:   Postpartum Care  History of Present Illness:   Tiffany Goodwin is a 26 y.o. 672P2002 Caucasian female being seen today for a postpartum visit. She is 4 weeks postpartum following a spontaneous vaginal delivery at 40.2 gestational weeks. Anesthesia: epidural. I have fully reviewed the prenatal and intrapartum course. Pregnancy uncomplicated. Postpartum course has been uncomplicated. Bleeding no bleeding. Bowel function is normal. Bladder function is normal.  Patient is sexually active. Last sexual activity: few days ago.  Contraception method is coitus interruptus and wants coc's. Does not smoke, no h/o HTN, DVT/PE, CVA, MI, or migraines w/ aura.  [place] Postpartum Depression Screening: negative. Score 2.   Last pap 11/18/14.  Results were normal .  Patient's last menstrual period was 01/04/2017.  Baby's course has been uncomplicated. Baby is feeding by bottle.  Review of Systems:   Pertinent items are noted in HPI Denies Abnormal vaginal discharge w/ itching/odor/irritation, headaches, visual changes, shortness of breath, chest pain, abdominal pain, severe nausea/vomiting, or problems with urination or bowel movements. Pertinent History Reviewed:  Reviewed past medical,surgical, obstetrical and family history.  Reviewed problem list, medications and allergies. OB History  Gravida Para Term Preterm AB Living  2 2 2  0 0 2  SAB TAB Ectopic Multiple Live Births  0 0 0 0 2    # Outcome Date GA Lbr Len/2nd Weight Sex Delivery Anes PTL Lv  2 Term 10/19/17 4823w2d 17:50 / 02:43 8 lb 7.5 oz (3.841 kg) M Vag-Spont EPI  LIV  1 Term 11/26/11 3033w0d 09:06 / 01:00 6 lb 13.8 oz (3.113 kg) F Vag-Vacuum EPI  LIV     Birth Comments: caput, moulding     Physical Assessment:   Vitals:   11/18/17 1013  BP: 102/68  Pulse: 72  Weight: 149 lb (67.6 kg)  Height: 5\' 7"  (1.702 m)  Body mass index is  23.34 kg/m.       Physical Examination:   General appearance: alert, well appearing, and in no distress  Mental status: alert, oriented to person, place, and time  Skin: warm & dry   Cardiovascular: normal heart rate noted   Respiratory: normal respiratory effort, no distress   Breasts: deferred, no complaints   Abdomen: soft, non-tender   Pelvic: VULVA: normal appearing vulva with no masses, tenderness or lesions, UTERUS: uterus is normal size, shape, consistency and nontender  Rectal: no hemorrhoids  Extremities: no edema       Results for orders placed or performed in visit on 11/18/17 (from the past 24 hour(s))  POCT urine pregnancy   Collection Time: 11/18/17 10:19 AM  Result Value Ref Range   Preg Test, Ur Negative Negative    Assessment & Plan:  1) Postpartum exam 2) 4 wks s/p SVB 3) Bottlefeeding 4) Depression screening 5) Contraception counseling, pt prefers OCP (estrogen/progesterone), rx LoLoestrin 3pk w/ 3RF  Follow-up: Return in about 3 months (around 02/16/2018) for Pap & physical. and coc f/u  Orders Placed This Encounter  Procedures  . POCT urine pregnancy    Marge DuncansBooker, Abriana Saltos Randall CNM, [facility]NP-BC 11/18/2017 10:43 AM

## 2017-12-25 ENCOUNTER — Other Ambulatory Visit: Payer: Self-pay

## 2017-12-25 ENCOUNTER — Encounter (HOSPITAL_COMMUNITY): Payer: Self-pay | Admitting: Emergency Medicine

## 2017-12-25 ENCOUNTER — Emergency Department (HOSPITAL_COMMUNITY)
Admission: EM | Admit: 2017-12-25 | Discharge: 2017-12-25 | Disposition: A | Discharge status: Home / Self Care | Payer: BLUE CROSS/BLUE SHIELD | Attending: Emergency Medicine | Admitting: Emergency Medicine

## 2017-12-25 DIAGNOSIS — R51 Headache: Secondary | ICD-10-CM | POA: Diagnosis present

## 2017-12-25 DIAGNOSIS — Z5321 Procedure and treatment not carried out due to patient leaving prior to being seen by health care provider: Secondary | ICD-10-CM | POA: Insufficient documentation

## 2017-12-25 NOTE — ED Triage Notes (Signed)
Patient reports body aches, chills and headache since last night. Afebrile in Triage. Denies n/v/d.

## 2017-12-25 NOTE — ED Notes (Signed)
Registration reports patient left.

## 2018-02-13 ENCOUNTER — Other Ambulatory Visit: Payer: Medicaid Other | Admitting: Women's Health

## 2018-03-13 ENCOUNTER — Other Ambulatory Visit: Payer: Medicaid Other | Admitting: Women's Health

## 2018-03-31 ENCOUNTER — Encounter: Payer: Self-pay | Admitting: Women's Health

## 2018-03-31 ENCOUNTER — Ambulatory Visit (INDEPENDENT_AMBULATORY_CARE_PROVIDER_SITE_OTHER): Payer: BLUE CROSS/BLUE SHIELD | Admitting: Women's Health

## 2018-03-31 ENCOUNTER — Encounter (INDEPENDENT_AMBULATORY_CARE_PROVIDER_SITE_OTHER): Payer: Self-pay

## 2018-03-31 ENCOUNTER — Other Ambulatory Visit (HOSPITAL_COMMUNITY)
Admission: RE | Admit: 2018-03-31 | Discharge: 2018-03-31 | Disposition: A | Discharge status: Home / Self Care | Payer: BLUE CROSS/BLUE SHIELD | Source: Ambulatory Visit | Attending: Obstetrics & Gynecology | Admitting: Obstetrics & Gynecology

## 2018-03-31 VITALS — BP 110/70 | HR 98 | Ht 62.0 in | Wt 161.3 lb

## 2018-03-31 DIAGNOSIS — Z01419 Encounter for gynecological examination (general) (routine) without abnormal findings: Secondary | ICD-10-CM

## 2018-03-31 DIAGNOSIS — Z309 Encounter for contraceptive management, unspecified: Secondary | ICD-10-CM | POA: Diagnosis not present

## 2018-03-31 DIAGNOSIS — Z113 Encounter for screening for infections with a predominantly sexual mode of transmission: Secondary | ICD-10-CM

## 2018-03-31 DIAGNOSIS — R87810 Cervical high risk human papillomavirus (HPV) DNA test positive: Secondary | ICD-10-CM | POA: Insufficient documentation

## 2018-03-31 DIAGNOSIS — R87610 Atypical squamous cells of undetermined significance on cytologic smear of cervix (ASC-US): Secondary | ICD-10-CM | POA: Insufficient documentation

## 2018-03-31 DIAGNOSIS — Z3009 Encounter for other general counseling and advice on contraception: Secondary | ICD-10-CM

## 2018-03-31 NOTE — Addendum Note (Signed)
Addended by: Federico Flake A on: 03/31/2018 09:02 AM   Modules accepted: Orders

## 2018-03-31 NOTE — Progress Notes (Signed)
   WELL-WOMAN EXAMINATION Patient name: Tiffany Goodwin MRN [id number]  Date of birth: 03-27-1991 Chief Complaint:   Gynecologic Exam (pap/physcial)  History of Present Illness:   Tiffany Goodwin is a 27 y.o. G78P2002 Caucasian female being seen today for a routine FP [facility] well-woman exam.  Current complaints: none  PCP: none      Patient's last menstrual period was 03/26/2018. The current method of family planning is OCP (estrogen/progesterone) Last pap 11/18/14. Results were: normal Last mammogram: never. Results were: n/a Last colonoscopy: never. Results were: n/a  Review of Systems:   Pertinent items are noted in HPI Denies any headaches, blurred vision, fatigue, shortness of breath, chest pain, abdominal pain, abnormal vaginal discharge/itching/odor/irritation, problems with periods, bowel movements, urination, or intercourse unless otherwise stated above. Pertinent History Reviewed:  Reviewed past medical,surgical, social and family history.  Reviewed problem list, medications and allergies. Physical Assessment:   Vitals:   03/31/18 0834  BP: 110/70  Pulse: 98  Weight: 161 lb 4.8 oz (73.2 kg)  Height:  (1.575 m)  Body mass index is 29.5 kg/m.        Physical Examination:   General appearance - well appearing, and in no distress  Mental status - alert, oriented to person, place, and time  Psych:  She has a normal mood and affect  Skin - warm and dry, normal color, no suspicious lesions noted  Chest - effort normal, all lung fields clear to auscultation bilaterally  Heart - normal rate and regular rhythm  Neck:  midline trachea, no thyromegaly or nodules  Breasts - breasts appear normal, no suspicious masses, no skin or nipple changes or  axillary nodes  Abdomen - soft, nontender, nondistended, no masses or organomegaly  Pelvic - VULVA: normal appearing vulva with no masses, tenderness or lesions  VAGINA: normal appearing vagina with normal color and discharge, no lesions   CERVIX: normal appearing cervix without discharge or lesions, no CMT  Thin prep pap is done w/ reflex HR HPV cotesting  UTERUS: uterus is felt to be normal size, shape, consistency and nontender   ADNEXA: No adnexal masses or tenderness noted.  Extremities:  No swelling or varicosities noted  No results found for this or any previous visit (from the past 24 hour(s)).  Assessment & Plan:  1) [facility] Well-Woman Exam  2) STD screen  Labs/procedures today: pap, gc/ct, hiv, rpr  Mammogram  or sooner if problems Colonoscopy  or sooner if problems  Orders Placed This Encounter  Procedures  . HIV antibody  . RPR    Follow-up: Return in about 1 year (around 04/01/2019) for Physical.  Cheral Marker CNM, [facility] 03/31/2018 8:57 AM

## 2018-04-01 LAB — RPR: RPR: NONREACTIVE

## 2018-04-01 LAB — HIV ANTIBODY (ROUTINE TESTING W REFLEX): HIV SCREEN 4TH GENERATION: NONREACTIVE

## 2018-04-03 ENCOUNTER — Telehealth: Payer: Self-pay | Admitting: *Deleted

## 2018-04-03 ENCOUNTER — Encounter: Payer: Self-pay | Admitting: Women's Health

## 2018-04-03 DIAGNOSIS — R87619 Unspecified abnormal cytological findings in specimens from cervix uteri: Secondary | ICD-10-CM | POA: Insufficient documentation

## 2018-04-03 HISTORY — DX: Unspecified abnormal cytological findings in specimens from cervix uteri: R87.619

## 2018-04-03 LAB — CYTOLOGY - PAP
CHLAMYDIA, DNA PROBE: NEGATIVE
Diagnosis: UNDETERMINED — AB
HPV (WINDOPATH): DETECTED — AB
NEISSERIA GONORRHEA: NEGATIVE

## 2018-04-03 NOTE — Telephone Encounter (Signed)
LMOVM for patient to return my call regarding her pap.

## 2018-04-03 NOTE — Telephone Encounter (Signed)
Informed patient pap was abnormal and needed to schedule colpo with MD.  All questions answered. Pt transferred to front desk to make appointment.

## 2018-04-23 ENCOUNTER — Encounter: Payer: Medicaid Other | Admitting: Obstetrics and Gynecology

## 2018-04-30 ENCOUNTER — Encounter: Payer: Self-pay | Admitting: Obstetrics and Gynecology

## 2018-04-30 ENCOUNTER — Ambulatory Visit (INDEPENDENT_AMBULATORY_CARE_PROVIDER_SITE_OTHER): Payer: BLUE CROSS/BLUE SHIELD | Admitting: Obstetrics and Gynecology

## 2018-04-30 ENCOUNTER — Other Ambulatory Visit: Payer: Self-pay | Admitting: Obstetrics and Gynecology

## 2018-04-30 VITALS — BP 126/58 | HR 78 | Ht 67.0 in | Wt 159.8 lb

## 2018-04-30 DIAGNOSIS — Z3202 Encounter for pregnancy test, result negative: Secondary | ICD-10-CM

## 2018-04-30 DIAGNOSIS — N870 Mild cervical dysplasia: Secondary | ICD-10-CM

## 2018-04-30 LAB — POCT URINE PREGNANCY: Preg Test, Ur: NEGATIVE

## 2018-04-30 NOTE — Patient Instructions (Signed)
Colposcopy, Care After  This sheet gives you information about how to care for yourself after your procedure. Your doctor may also give you more specific instructions. If you have problems or questions, contact your doctor.  What can I expect after the procedure?  If you did not have a tissue sample removed (did not have a biopsy), you may only have some spotting for a few days. You can go back to your normal activities.  If you had a tissue sample removed, it is common to have:  · Soreness and pain. This may last for a few days.  · Light-headedness.  · Mild bleeding from your vagina or dark-colored, grainy discharge from your vagina. This may last for a few days. You may need to wear a sanitary pad.  · Spotting for at least 48 hours after the procedure.    Follow these instructions at home:  · Take over-the-counter and prescription medicines only as told by your doctor. Ask your doctor what medicines you can start taking again. This is very important if you take blood-thinning medicine.  · Do not drive or use heavy machinery while taking prescription pain medicine.  · For 3 days, or as long as your doctor tells you, avoid:  ? Douching.  ? Using tampons.  ? Having sex.  · If you use birth control (contraception), keep using it.  · Limit activity for the first day after the procedure. Ask your doctor what activities are safe for you.  · It is up to you to get the results of your procedure. Ask your doctor when your results will be ready.  · Keep all follow-up visits as told by your doctor. This is important.  Contact a doctor if:  · You get a skin rash.  Get help right away if:  · You are bleeding a lot from your vagina. It is a lot of bleeding if you are using more than one pad an hour for 2 hours in a row.  · You have clumps of blood (blood clots) coming from your vagina.  · You have a fever.  · You have chills  · You have pain in your lower belly (pelvic area).  · You have signs of infection, such as vaginal  discharge that is:  ? Different than usual.  ? Yellow.  ? Bad-smelling.  · You have very pain or cramps in your lower belly that do not get better with medicine.  · You feel light-headed.  · You feel dizzy.  · You pass out (faint).  Summary  · If you did not have a tissue sample removed (did not have a biopsy), you may only have some spotting for a few days. You can go back to your normal activities.  · If you had a tissue sample removed, it is common to have mild pain and spotting for 48 hours.  · For 3 days, or as long as your doctor tells you, avoid douching, using tampons and having sex.  · Get help right away if you have bleeding, very bad pain, or signs of infection.  This information is not intended to replace advice given to you by your health care provider. Make sure you discuss any questions you have with your health care provider.  Document Released: 04/23/2008 Document Revised: 07/25/2016 Document Reviewed: 07/25/2016  Elsevier Interactive Patient Education © 2018 Elsevier Inc.

## 2018-04-30 NOTE — Progress Notes (Signed)
Patient ID: Tiffany Goodwin, female   DOB: 09/15/1991, 27 y.o.   MRN: [id number]   Tiffany Goodwin 27 y.o. [id number] here for colposcopy for atypical squamous cellularity of undetermined significance (ASCUS) pap smear on 03/31/2018.  Discussed role for HPV in cervical dysplasia, need for surveillance.  Patient given informed consent, signed copy in the chart, time out was performed.  Placed in lithotomy position. Cervix viewed with speculum and colposcope after application of acetic acid.   Colposcopy adequate? Yes clear endocervical canal well visualized Findings: CIN-1 posterior lip of the cervix; biopsies obtained at 6 o'clock.  Removing the entire small specimen ECC specimen not obtained. All specimens were labelled and sent to pathology.   Colposcopy IMPRESSION: 1.  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 Will follow up pathology and manage accordingly.  Routine preventative health maintenance measures emphasized.  Plan: 1) Rx metronidazole twice a day for 1 week  By signing my name below, I, Arnette NorrisMari Johnson, attest that this documentation has been prepared under the direction and in the presence of Tilda BurrowFerguson, Astin Sayre V, MD. Electronically Signed: Arnette NorrisMari Johnson Medical Scribe. 04/30/18. 2:25 PM.  I personally performed the services described in this documentation, which was SCRIBED in my presence. The recorded information has been reviewed and considered accurate. It has been edited as necessary during review. Tilda BurrowJohn V Dhruva Orndoff, MD

## 2018-05-01 ENCOUNTER — Telehealth: Payer: Self-pay | Admitting: Obstetrics and Gynecology

## 2018-05-01 NOTE — Telephone Encounter (Signed)
Was here yesterday and Tiffany Goodwin was going to call in script and she does not think it was sent can we check and see if it went to pharmacy [facility] [place])

## 2018-05-02 NOTE — Telephone Encounter (Signed)
Metronidazole 500mg  BID x7 days Dispense 14, no refills Dr Gean MaidensJohn Fergsuon [facility] Pt aware.

## 2018-05-06 ENCOUNTER — Telehealth: Payer: Self-pay | Admitting: *Deleted

## 2018-05-17 NOTE — Telephone Encounter (Signed)
Pt reminded that we advised her at the appointment to keep appointment one year. Pt told that if she had further questions to call our office , ask for "tish" next week. Per biopsy review , pt had mild dysplasia, and I thought the area was biopsied off. Will repeat pap 1 yr with cotesting for HPV.

## 2018-09-27 ENCOUNTER — Encounter (HOSPITAL_COMMUNITY): Payer: Self-pay | Admitting: Emergency Medicine

## 2018-09-27 ENCOUNTER — Emergency Department (HOSPITAL_COMMUNITY)
Admission: EM | Admit: 2018-09-27 | Discharge: 2018-09-27 | Disposition: A | Discharge status: Home / Self Care | Payer: BLUE CROSS/BLUE SHIELD | Attending: Emergency Medicine | Admitting: Emergency Medicine

## 2018-09-27 ENCOUNTER — Other Ambulatory Visit: Payer: Self-pay

## 2018-09-27 DIAGNOSIS — J029 Acute pharyngitis, unspecified: Secondary | ICD-10-CM | POA: Insufficient documentation

## 2018-09-27 DIAGNOSIS — F419 Anxiety disorder, unspecified: Secondary | ICD-10-CM | POA: Insufficient documentation

## 2018-09-27 DIAGNOSIS — Z79899 Other long term (current) drug therapy: Secondary | ICD-10-CM | POA: Diagnosis not present

## 2018-09-27 LAB — POC URINE PREG, ED: PREG TEST UR: NEGATIVE

## 2018-09-27 LAB — GROUP A STREP BY PCR: GROUP A STREP BY PCR: NOT DETECTED

## 2018-09-27 MED ORDER — IBUPROFEN 800 MG PO TABS
800.0000 mg | ORAL_TABLET | Freq: Once | ORAL | Status: AC
  Administered 2018-09-27: 800 mg via ORAL
  Filled 2018-09-27: qty 1

## 2018-09-27 MED ORDER — MAGIC MOUTHWASH W/LIDOCAINE: 10.0000 mL | Freq: Four times a day (QID) | ORAL | 0 refills | Status: DC | PRN

## 2018-09-27 MED ORDER — IBUPROFEN 600 MG PO TABS: 600.0000 mg | ORAL_TABLET | Freq: Four times a day (QID) | ORAL | 0 refills | Status: DC | PRN

## 2018-09-27 NOTE — ED Triage Notes (Signed)
PT c/o sore throat, body aches, and non-productive cough x 3 days. Denies fever.

## 2018-09-27 NOTE — Discharge Instructions (Addendum)
You do not have strep throat, but a viral infection which should go away without the need for antibiotics.  You may use the medicines prescribed for pain relief while this is going away.  You may also get relief from warm water salt gargles, rest, and drinking plenty of fluids.

## 2018-09-27 NOTE — ED Provider Notes (Signed)
[facility] EMERGENCY DEPARTMENT Provider Note   CSN: [id number] Arrival date & time: 09/27/18  1530     History   Chief Complaint Chief Complaint  Patient presents with  . Sore Throat    HPI Tiffany Goodwin is a 27 y.o. female presenting with a 3 day history of sore throat and subjective fever, describing sharp pain with swallowing and chills. She also reports occasional cough, nonproductive, denies nasal, sinus or ear complaints.  Symptoms do not include shortness of breath, chest pain,  Nausea, vomiting or diarrhea.  The patient has taken cough and throat lozenges prior to arrival with no significant improvement in symptoms. Marland Kitchen  HPI  Past Medical History:  Diagnosis Date  . Anxiety   . BV (bacterial vaginosis) 10/21/2014  . Itching in the vaginal area 12/07/2015  . LLQ pain 04/19/2015  . Missed period 04/19/2015  . Patient desires pregnancy 12/07/2015  . Pelvic pain in female 11/18/2014  . Pneumonia 2016,january   left side  . Screening for STD (sexually transmitted disease) 01/20/2015  . Vaginal discharge 10/21/2014    Patient Active Problem List   Diagnosis Date Noted  . Abnormal Pap smear of cervix 04/03/2018  . Spider vein of right lower extremity 09/04/2017    Past Surgical History:  Procedure Laterality Date  . TONSILLECTOMY    . WISDOM TOOTH EXTRACTION    . WISDOM TOOTH EXTRACTION       OB History    Gravida  2   Para  2   Term  2   Preterm  0   AB  0   Living  2     SAB  0   TAB  0   Ectopic  0   Multiple  0   Live Births  2            Home Medications    Prior to Admission medications   Medication Sig Start Date End Date Taking? Authorizing Provider  acetaminophen (TYLENOL) 500 MG tablet Take 500 mg by mouth every 6 (six) hours as needed for moderate pain or headache.     [provider]  ibuprofen (ADVIL,MOTRIN) 600 MG tablet Take 1 tablet (600 mg total) by mouth every 6 (six) hours as needed. 09/27/18   Burgess Amor, PA-C    magic mouthwash w/lidocaine SOLN Take 10 mLs by mouth 4 (four) times daily as needed (throat pain,  gargle and spit this medication). Note to pharmacy - equal parts diphendydramine, aluminum hydroxide and lidocaine HCL 09/27/18   Kendrick Haapala, Raynelle Fanning, PA-C  Norethindrone-Ethinyl Estradiol-Fe Biphas (LO LOESTRIN FE) 1 MG-10 MCG / 10 MCG tablet Take 1 tablet by mouth daily. 11/18/17   Cheral Marker, CNM    Family History Family History  Problem Relation Age of Onset  . Heart attack Maternal Grandmother   . Stroke Maternal Grandmother   . Other Paternal Grandfather        brain aneursym  . Cancer Maternal Grandfather        bone,chest,liver,brain,pancreatic,throat  . Anesthesia problems Neg Hx   . Hypotension Neg Hx   . Malignant hyperthermia Neg Hx   . Pseudochol deficiency Neg Hx     Social History Social History   Tobacco Use  . Smoking status: Never Smoker  . Smokeless tobacco: Never Used  Substance Use Topics  . Alcohol use: No  . Drug use: No     Allergies   Patient has no known allergies.   Review of Systems Review  of Systems  Constitutional: Positive for chills and fever.  HENT: Positive for sore throat. Negative for congestion, ear pain, rhinorrhea, sinus pressure, trouble swallowing and voice change.   Eyes: Negative for discharge.  Respiratory: Positive for cough. Negative for shortness of breath, wheezing and stridor.   Cardiovascular: Negative for chest pain.  Gastrointestinal: Negative for abdominal pain.  Genitourinary: Negative.      Physical Exam Updated Vital Signs BP 130/78 (BP Location: Right Arm)   Pulse 86   Temp 98.5 F (36.9 C) (Oral)   Resp 18   Ht 5\' 7"  (1.702 m)   Wt 79.4 kg   SpO2 96%   Breastfeeding? No   BMI 27.41 kg/m   Physical Exam  Constitutional: She is oriented to person, place, and time. She appears well-developed and well-nourished.  HENT:  Head: Normocephalic and atraumatic.  Right Ear: Tympanic membrane and ear canal  normal.  Left Ear: Tympanic membrane and ear canal normal.  Nose: Mucosal edema and rhinorrhea present.  Mouth/Throat: Uvula is midline and mucous membranes are normal. Posterior oropharyngeal erythema present. No oropharyngeal exudate or posterior oropharyngeal edema.  Mild posterior erythema, no exudate or edema.   Eyes: Conjunctivae are normal.  Cardiovascular: Normal rate and normal heart sounds.  Pulmonary/Chest: Effort normal. No respiratory distress. She has no wheezes. She has no rales.  Musculoskeletal: Normal range of motion.  Neurological: She is alert and oriented to person, place, and time.  Skin: Skin is warm and dry. No rash noted.  Psychiatric: She has a normal mood and affect.     ED Treatments / Results  Labs (all labs ordered are listed, but only abnormal results are displayed) Labs Reviewed  GROUP A STREP BY PCR  POC URINE PREG, ED    EKG None  Radiology No results found.  Procedures Procedures (including critical care time)  Medications Ordered in ED Medications  ibuprofen (ADVIL,MOTRIN) tablet 800 mg (800 mg Oral Given 09/27/18 1632)     Initial Impression / Assessment and Plan / ED Course  I have reviewed the triage vital signs and the nursing notes.  Pertinent labs & imaging results that were available during my care of the patient were reviewed by me and considered in my medical decision making (see chart for details).     Strep negative.  Pt prescribed magic mouthwash for gargle/pain control, ibuprofen.  Discussed home tx relief measures.  Prn f/u anticipated.  Final Clinical Impressions(s) / ED Diagnoses   Final diagnoses:  Viral pharyngitis    ED Discharge Orders         Ordered    ibuprofen (ADVIL,MOTRIN) 600 MG tablet  Every 6 hours PRN     09/27/18 1756    magic mouthwash w/lidocaine SOLN  4 times daily PRN     09/27/18 1756           Burgess Amor, PA-C 09/27/18 Rebbeca Paul, MD 09/28/18 2011

## 2018-10-06 ENCOUNTER — Telehealth: Payer: Self-pay | Admitting: *Deleted

## 2018-10-06 NOTE — Telephone Encounter (Signed)
LMOVM returning pts call.  

## 2018-10-09 ENCOUNTER — Ambulatory Visit: Payer: BLUE CROSS/BLUE SHIELD | Admitting: Obstetrics & Gynecology

## 2018-10-20 ENCOUNTER — Ambulatory Visit: Payer: BLUE CROSS/BLUE SHIELD | Admitting: Obstetrics & Gynecology

## 2018-11-10 ENCOUNTER — Other Ambulatory Visit: Payer: Self-pay | Admitting: Women's Health

## 2018-11-13 ENCOUNTER — Telehealth: Payer: Self-pay | Admitting: Women's Health

## 2018-11-13 MED ORDER — NORETHIN-ETH ESTRAD-FE BIPHAS 1 MG-10 MCG / 10 MCG PO TABS: 1.0000 | ORAL_TABLET | Freq: Every day | ORAL | 3 refills | Status: DC

## 2018-11-13 NOTE — Telephone Encounter (Signed)
Please check pt said she sent in request for birth control and it was denied . Pt would like for a nurse to give her a call . Does she need appointment

## 2018-11-13 NOTE — Telephone Encounter (Signed)
Pt called requesting a refill on Lo Loestrin. Advised that I would send her request to a provider and she could check with her pharmacy later today. Pt verbalized understanding.

## 2018-11-28 ENCOUNTER — Emergency Department (HOSPITAL_COMMUNITY)
Admission: EM | Admit: 2018-11-28 | Discharge: 2018-11-28 | Disposition: A | Discharge status: Home / Self Care | Payer: BLUE CROSS/BLUE SHIELD | Attending: Emergency Medicine | Admitting: Emergency Medicine

## 2018-11-28 ENCOUNTER — Other Ambulatory Visit: Payer: Self-pay

## 2018-11-28 DIAGNOSIS — K529 Noninfective gastroenteritis and colitis, unspecified: Secondary | ICD-10-CM | POA: Diagnosis not present

## 2018-11-28 DIAGNOSIS — R112 Nausea with vomiting, unspecified: Secondary | ICD-10-CM | POA: Diagnosis present

## 2018-11-28 LAB — URINALYSIS, ROUTINE W REFLEX MICROSCOPIC
Bacteria, UA: NONE SEEN
Bilirubin Urine: NEGATIVE
GLUCOSE, UA: NEGATIVE mg/dL
Ketones, ur: 80 mg/dL — AB
Leukocytes, UA: NEGATIVE
Nitrite: NEGATIVE
Protein, ur: NEGATIVE mg/dL
Specific Gravity, Urine: 1.031 — ABNORMAL HIGH (ref 1.005–1.030)
pH: 5 (ref 5.0–8.0)

## 2018-11-28 LAB — CBC
HCT: 43.3 % (ref 36.0–46.0)
Hemoglobin: 14 g/dL (ref 12.0–15.0)
MCH: 29.3 pg (ref 26.0–34.0)
MCHC: 32.3 g/dL (ref 30.0–36.0)
MCV: 90.6 fL (ref 80.0–100.0)
Platelets: 321 10*3/uL (ref 150–400)
RBC: 4.78 MIL/uL (ref 3.87–5.11)
RDW: 12.7 % (ref 11.5–15.5)
WBC: 15.1 10*3/uL — ABNORMAL HIGH (ref 4.0–10.5)
nRBC: 0 % (ref 0.0–0.2)

## 2018-11-28 LAB — COMPREHENSIVE METABOLIC PANEL
ALT: 25 U/L (ref 0–44)
AST: 22 U/L (ref 15–41)
Albumin: 4.1 g/dL (ref 3.5–5.0)
Alkaline Phosphatase: 58 U/L (ref 38–126)
Anion gap: 10 (ref 5–15)
BUN: 18 mg/dL (ref 6–20)
CO2: 20 mmol/L — ABNORMAL LOW (ref 22–32)
Calcium: 8.7 mg/dL — ABNORMAL LOW (ref 8.9–10.3)
Chloride: 109 mmol/L (ref 98–111)
Creatinine, Ser: 0.55 mg/dL (ref 0.44–1.00)
GFR calc Af Amer: >60 mL/min (ref >60)
GFR calc non Af Amer: >60 mL/min (ref >60)
Glucose, Bld: 122 mg/dL — ABNORMAL HIGH (ref 70–99)
Potassium: 3.6 mmol/L (ref 3.5–5.1)
Sodium: 139 mmol/L (ref 135–145)
Total Bilirubin: 0.8 mg/dL (ref 0.3–1.2)
Total Protein: 7.7 g/dL (ref 6.5–8.1)

## 2018-11-28 LAB — LIPASE, BLOOD: Lipase: 23 U/L (ref 11–51)

## 2018-11-28 LAB — PREGNANCY, URINE: Preg Test, Ur: NEGATIVE

## 2018-11-28 MED ORDER — ONDANSETRON HCL 4 MG PO TABS: 4.0000 mg | ORAL_TABLET | Freq: Four times a day (QID) | ORAL | 0 refills | Status: DC

## 2018-11-28 MED ORDER — ONDANSETRON HCL 4 MG/2ML IJ SOLN
4.0000 mg | Freq: Once | INTRAMUSCULAR | Status: AC
  Administered 2018-11-28: 4 mg via INTRAVENOUS
  Filled 2018-11-28: qty 2

## 2018-11-28 MED ORDER — SODIUM CHLORIDE 0.9 % IV BOLUS
1000.0000 mL | Freq: Once | INTRAVENOUS | Status: AC
  Administered 2018-11-28 (×2): 1000 mL via INTRAVENOUS

## 2018-11-28 MED ORDER — SODIUM CHLORIDE 0.9 % IV BOLUS
1000.0000 mL | Freq: Once | INTRAVENOUS | Status: AC
  Administered 2018-11-28: 1000 mL via INTRAVENOUS

## 2018-11-28 MED ORDER — FENTANYL CITRATE (PF) 100 MCG/2ML IJ SOLN
50.0000 ug | Freq: Once | INTRAMUSCULAR | Status: AC
  Administered 2018-11-28: 50 ug via INTRAVENOUS
  Filled 2018-11-28: qty 2

## 2018-11-28 NOTE — ED Provider Notes (Signed)
[facility] EMERGENCY DEPARTMENT Provider Note   CSN: [id number] Arrival date & time: 11/28/18  1944     History   Chief Complaint Chief Complaint  Patient presents with  . Diarrhea    vomiting    HPI ZYAUNA Goodwin is a 28 y.o. female.  Multiple episodes of nausea, vomiting, diarrhea earlier since 1300 today.  Patient feels dehydrated.  No obvious sick contacts.  Fever, sweats, chills, dysuria, cough, sore throat.  Severity of symptoms is moderate.  Nothing makes symptoms better or worse.     Past Medical History:  Diagnosis Date  . Anxiety   . BV (bacterial vaginosis) 10/21/2014  . Itching in the vaginal area 12/07/2015  . LLQ pain 04/19/2015  . Missed period 04/19/2015  . Patient desires pregnancy 12/07/2015  . Pelvic pain in female 11/18/2014  . Pneumonia 2016,january   left side  . Screening for STD (sexually transmitted disease) 01/20/2015  . Vaginal discharge 10/21/2014    Patient Active Problem List   Diagnosis Date Noted  . Abnormal Pap smear of cervix 04/03/2018  . Spider vein of right lower extremity 09/04/2017    Past Surgical History:  Procedure Laterality Date  . TONSILLECTOMY    . WISDOM TOOTH EXTRACTION    . WISDOM TOOTH EXTRACTION       OB History    Gravida  2   Para  2   Term  2   Preterm  0   AB  0   Living  2     SAB  0   TAB  0   Ectopic  0   Multiple  0   Live Births  2            Home Medications    Prior to Admission medications   Medication Sig Start Date End Date Taking? Authorizing Provider  Norethindrone-Ethinyl Estradiol-Fe Biphas (LO LOESTRIN FE) 1 MG-10 MCG / 10 MCG tablet Take 1 tablet by mouth daily. 11/13/18  Yes Cheral Marker, CNM  ondansetron (ZOFRAN) 4 MG tablet Take 1 tablet (4 mg total) by mouth every 6 (six) hours. 11/28/18   Donnetta Hutching, MD    Family History Family History  Problem Relation Age of Onset  . Heart attack Maternal Grandmother   . Stroke Maternal Grandmother   . Other  Paternal Grandfather        brain aneursym  . Cancer Maternal Grandfather        bone,chest,liver,brain,pancreatic,throat  . Anesthesia problems Neg Hx   . Hypotension Neg Hx   . Malignant hyperthermia Neg Hx   . Pseudochol deficiency Neg Hx     Social History Social History   Tobacco Use  . Smoking status: Never Smoker  . Smokeless tobacco: Never Used  Substance Use Topics  . Alcohol use: No  . Drug use: No     Allergies   Patient has no known allergies.   Review of Systems Review of Systems  All other systems reviewed and are negative.    Physical Exam Updated Vital Signs BP 102/64   Pulse 81   Temp 98.2 F (36.8 C) (Oral)   Resp 18   Ht 5\' 7"  (1.702 m)   Wt 77.1 kg   LMP 10/20/2018   SpO2 97%   BMI 26.63 kg/m   Physical Exam Vitals signs and nursing note reviewed.  Constitutional:      Appearance: She is well-developed.  HENT:     Head: Normocephalic and atraumatic.  Eyes:  Conjunctiva/sclera: Conjunctivae normal.  Neck:     Musculoskeletal: Neck supple.  Cardiovascular:     Rate and Rhythm: Normal rate and regular rhythm.  Pulmonary:     Effort: Pulmonary effort is normal.     Breath sounds: Normal breath sounds.  Abdominal:     General: Bowel sounds are normal.     Comments: Minimal generalized abdominal tenderness.  Musculoskeletal: Normal range of motion.  Skin:    General: Skin is warm and dry.  Neurological:     Mental Status: She is alert and oriented to person, place, and time.  Psychiatric:        Behavior: Behavior normal.      ED Treatments / Results  Labs (all labs ordered are listed, but only abnormal results are displayed) Labs Reviewed  COMPREHENSIVE METABOLIC PANEL - Abnormal; Notable for the following components:      Result Value   CO2 20 (*)    Glucose, Bld 122 (*)    Calcium 8.7 (*)    All other components within normal limits  CBC - Abnormal; Notable for the following components:   WBC 15.1 (*)    All  other components within normal limits  URINALYSIS, ROUTINE W REFLEX MICROSCOPIC - Abnormal; Notable for the following components:   APPearance HAZY (*)    Specific Gravity, Urine 1.031 (*)    Hgb urine dipstick MODERATE (*)    Ketones, ur 80 (*)    All other components within normal limits  LIPASE, BLOOD  PREGNANCY, URINE    EKG None  Radiology No results found.  Procedures Procedures (including critical care time)  Medications Ordered in ED Medications  sodium chloride 0.9 % bolus 1,000 mL (1,000 mLs Intravenous New Bag/Given 11/28/18 2233)  ondansetron (ZOFRAN) injection 4 mg (4 mg Intravenous Given 11/28/18 2026)  sodium chloride 0.9 % bolus 1,000 mL (0 mLs Intravenous Stopped 11/28/18 2300)  fentaNYL (SUBLIMAZE) injection 50 mcg (50 mcg Intravenous Given 11/28/18 2026)  sodium chloride 0.9 % bolus 1,000 mL (0 mLs Intravenous Stopped 11/28/18 2137)  fentaNYL (SUBLIMAZE) injection 50 mcg (50 mcg Intravenous Given 11/28/18 2233)     Initial Impression / Assessment and Plan / ED Course  I have reviewed the triage vital signs and the nursing notes.  Pertinent labs & imaging results that were available during my care of the patient were reviewed by me and considered in my medical decision making (see chart for details).     History and physical most consistent with gastroenteritis.  Patient is dehydrated.  She responded well to 3 L of IV fluids, IV Zofran, IV fentanyl.  No acute abdomen at discharge.  Discharge medications Zofran 4 mg.  Final Clinical Impressions(s) / ED Diagnoses   Final diagnoses:  Gastroenteritis    ED Discharge Orders         Ordered    ondansetron (ZOFRAN) 4 MG tablet  Every 6 hours     11/28/18 2332           Donnetta Hutching, MD 11/29/18 1553

## 2018-11-28 NOTE — Discharge Instructions (Addendum)
Clear liquids.  Medication for nausea.  Return if worse.

## 2018-11-28 NOTE — ED Triage Notes (Signed)
Pt reports numerous episodes of diarrhea and vomiting that started today with abdominal cramping. Pt does not have fever. Pt reports she has urinated recently, but is unable to keep down Gatorade or Pedialyte.

## 2018-12-22 ENCOUNTER — Encounter (HOSPITAL_COMMUNITY): Payer: Self-pay

## 2018-12-22 ENCOUNTER — Emergency Department (HOSPITAL_COMMUNITY): Payer: BLUE CROSS/BLUE SHIELD

## 2018-12-22 ENCOUNTER — Emergency Department (HOSPITAL_COMMUNITY)
Admission: EM | Admit: 2018-12-22 | Discharge: 2018-12-22 | Disposition: A | Discharge status: Home / Self Care | Payer: BLUE CROSS/BLUE SHIELD | Attending: Emergency Medicine | Admitting: Emergency Medicine

## 2018-12-22 ENCOUNTER — Other Ambulatory Visit: Payer: Self-pay

## 2018-12-22 DIAGNOSIS — J181 Lobar pneumonia, unspecified organism: Secondary | ICD-10-CM | POA: Diagnosis not present

## 2018-12-22 DIAGNOSIS — J189 Pneumonia, unspecified organism: Secondary | ICD-10-CM

## 2018-12-22 DIAGNOSIS — R0981 Nasal congestion: Secondary | ICD-10-CM | POA: Diagnosis present

## 2018-12-22 MED ORDER — DOXYCYCLINE HYCLATE 100 MG PO CAPS: 100.0000 mg | ORAL_CAPSULE | Freq: Two times a day (BID) | ORAL | 0 refills | Status: AC

## 2018-12-22 NOTE — ED Notes (Signed)
Pt's O2 sats remained at 100% while ambulating.

## 2018-12-22 NOTE — ED Provider Notes (Signed)
[facility] Provider Note   CSN: [id number] Arrival date & time: 12/22/18  1315     History   Chief Complaint Chief Complaint  Patient presents with  . Nasal Congestion  . Cough    HPI Tiffany Goodwin is a 28 y.o. female.  28 y.o female with a PMH of anxiety presents to the ED with a chief complaint of nasal congestion and cough x 1 week.  Reports a productive cough with green sputum, she also reports some right rib pain when coughing and taking a deep breath.  Evette Goodwin reports she has been taking Mucinex, DayQuil for symptoms but states no improvement.  She does endorse some shortness of breath specially with deep inspiration and sharp pain to the right side.  She denies any fever, chest pain, shortness of breath or recent hospitalizations.     Past Medical History:  Diagnosis Date  . Anxiety   . BV (bacterial vaginosis) 10/21/2014  . Itching in the vaginal area 12/07/2015  . LLQ pain 04/19/2015  . Missed period 04/19/2015  . Patient desires pregnancy 12/07/2015  . Pelvic pain in female 11/18/2014  . Pneumonia 2016,january   left side  . Screening for STD (sexually transmitted disease) 01/20/2015  . Vaginal discharge 10/21/2014    Patient Active Problem List   Diagnosis Date Noted  . Abnormal Pap smear of cervix 04/03/2018  . Spider vein of right lower extremity 09/04/2017    Past Surgical History:  Procedure Laterality Date  . TONSILLECTOMY    . WISDOM TOOTH EXTRACTION    . WISDOM TOOTH EXTRACTION       OB History    Gravida  2   Para  2   Term  2   Preterm  0   AB  0   Living  2     SAB  0   TAB  0   Ectopic  0   Multiple  0   Live Births  2            Home Medications    Prior to Admission medications   Medication Sig Start Date End Date Taking? Authorizing Provider  doxycycline (VIBRAMYCIN) 100 MG capsule Take 1 capsule (100 mg total) by mouth 2 (two) times daily for 7 days. 12/22/18 12/29/18  Claude MangesSoto, Koston Hennes, PA-C    Norethindrone-Ethinyl Estradiol-Fe Biphas (LO LOESTRIN FE) 1 MG-10 MCG / 10 MCG tablet Take 1 tablet by mouth daily. 11/13/18   Cheral MarkerBooker, Kimberly R, CNM  ondansetron (ZOFRAN) 4 MG tablet Take 1 tablet (4 mg total) by mouth every 6 (six) hours. 11/28/18   Donnetta Hutchingook, Brian, MD    Family History Family History  Problem Relation Age of Onset  . Heart attack Maternal Grandmother   . Stroke Maternal Grandmother   . Other Paternal Grandfather        brain aneursym  . Cancer Maternal Grandfather        bone,chest,liver,brain,pancreatic,throat  . Anesthesia problems Neg Hx   . Hypotension Neg Hx   . Malignant hyperthermia Neg Hx   . Pseudochol deficiency Neg Hx     Social History Social History   Tobacco Use  . Smoking status: Never Smoker  . Smokeless tobacco: Never Used  Substance Use Topics  . Alcohol use: No  . Drug use: No     Allergies   Patient has no known allergies.   Review of Systems Review of Systems  Constitutional: Positive for fever.  HENT: Positive for rhinorrhea. Negative  for sore throat.   Respiratory: Positive for cough. Negative for shortness of breath.   Cardiovascular: Negative for chest pain.     Physical Exam Updated Vital Signs BP 122/80 (BP Location: Right Arm)   Pulse 85   Temp 98.3 F (36.8 C) (Oral)   Resp 16   Ht 5\' 7"  (1.702 m)   Wt 77.1 kg   LMP 12/08/2018   SpO2 97%   BMI 26.63 kg/m   Physical Exam Vitals signs and nursing note reviewed.  Constitutional:      General: She is not in acute distress.    Appearance: She is well-developed.  HENT:     Head: Normocephalic and atraumatic.     Mouth/Throat:     Pharynx: No oropharyngeal exudate.  Eyes:     Pupils: Pupils are equal, round, and reactive to light.  Neck:     Musculoskeletal: Normal range of motion.  Cardiovascular:     Rate and Rhythm: Regular rhythm.     Heart sounds: Normal heart sounds.  Pulmonary:     Effort: Pulmonary effort is normal. No respiratory distress.      Breath sounds: Examination of the right-middle field reveals decreased breath sounds. Decreased breath sounds present. No wheezing or rhonchi.  Abdominal:     General: Bowel sounds are normal. There is no distension.     Palpations: Abdomen is soft.     Tenderness: There is no abdominal tenderness.  Musculoskeletal:        General: No tenderness or deformity.     Right lower leg: No edema.     Left lower leg: No edema.  Skin:    General: Skin is warm and dry.  Neurological:     Mental Status: She is alert and oriented to person, place, and time.      ED Treatments / Results  Labs (all labs ordered are listed, but only abnormal results are displayed) Labs Reviewed - No data to display  EKG None  Radiology Dg Chest 2 View  Result Date: 12/22/2018 CLINICAL DATA:  Productive cough. EXAM: CHEST - 2 VIEW COMPARISON:  January 11, 2012 FINDINGS: New right middle lobe infiltrate. The heart, hila, and mediastinum are normal. No pneumothorax. No other acute abnormalities. IMPRESSION: Right middle lobe infiltrate, likely pneumonia given history. Recommend follow-up to resolution. Electronically Signed   By: Gerome Samavid  Williams III M.D   On: 12/22/2018 15:05    Procedures Procedures (including critical care time)  Medications Ordered in ED Medications - No data to display   Initial Impression / Assessment and Plan / ED Course  I have reviewed the triage vital signs and the nursing notes.  Pertinent labs & imaging results that were available during my care of the patient were reviewed by me and considered in my medical decision making (see chart for details).    Presents with cough, nasal congestion x1 week.  Has tried over-the-counter medication but reports no improvement in symptoms.  She reports today when she was at work she began coughing and started feeling her right rib pain which is worse with deep inspiration.  DG chest 2 view obtained to rule out any consolidation as patient has  a previous history of pneumonia.  DG chest 2 view showed right middle lobe infiltrate, patient is currently satting at 97% on room air will ambulate prior to discharge.  No tachycardia, hypoxia, recent travel or hospitalization low suspicion for pulmonary embolism at this time.  Patient was ambulated by nursing staff, she  is stating at 99% but elevated HR. Curb 65 recommends outpatient treatment. Will send patient some with prescription for doxycycline.   Final Clinical Impressions(s) / ED Diagnoses   Final diagnoses:  Community acquired pneumonia of right middle lobe of lung [facility])    ED Discharge Orders         Ordered    doxycycline (VIBRAMYCIN) 100 MG capsule  2 times daily     12/22/18 1526           Claude Manges, PA-C 12/22/18 1601    Loren Racer, MD 12/25/18 [phone]

## 2018-12-22 NOTE — Discharge Instructions (Addendum)
I have prescribed antibiotics to treat your pneumonia, please take 1 tablet twice a day for the next 7 days.  These make sure to have a chest x-ray or get reevaluated after completion of your antibiotics.  If you experience any shortness of breath, chest pain, worsening symptoms please return to the ED.

## 2018-12-22 NOTE — ED Triage Notes (Signed)
Pt reports that she has had nasal congestion/drainage and cough for 1 week/ Cough is productive. Reports has a pain in right rib pain for a day

## 2018-12-22 NOTE — ED Notes (Signed)
Pt complaining of right rib pain. Has been coughing continuously and congested for the last week. Pain is worse with movement.

## 2019-01-21 ENCOUNTER — Ambulatory Visit (INDEPENDENT_AMBULATORY_CARE_PROVIDER_SITE_OTHER): Payer: BLUE CROSS/BLUE SHIELD | Admitting: Family Medicine

## 2019-01-21 ENCOUNTER — Encounter: Payer: Self-pay | Admitting: Family Medicine

## 2019-01-21 VITALS — BP 118/73 | HR 74 | Resp 12 | Ht 67.0 in | Wt 156.1 lb

## 2019-01-21 DIAGNOSIS — K5900 Constipation, unspecified: Secondary | ICD-10-CM

## 2019-01-21 DIAGNOSIS — Z0000 Encounter for general adult medical examination without abnormal findings: Secondary | ICD-10-CM | POA: Diagnosis not present

## 2019-01-21 DIAGNOSIS — M545 Low back pain, unspecified: Secondary | ICD-10-CM

## 2019-01-21 DIAGNOSIS — N921 Excessive and frequent menstruation with irregular cycle: Secondary | ICD-10-CM | POA: Diagnosis not present

## 2019-01-21 MED ORDER — TIZANIDINE HCL 4 MG PO CAPS: 4.0000 mg | ORAL_CAPSULE | Freq: Two times a day (BID) | ORAL | 0 refills | Status: AC | PRN

## 2019-01-21 MED ORDER — IBUPROFEN 800 MG PO TABS: 800.0000 mg | ORAL_TABLET | Freq: Three times a day (TID) | ORAL | 0 refills | Status: DC | PRN

## 2019-01-21 NOTE — Progress Notes (Signed)
New Patient Office Visit  Subjective:  Patient ID: June LeapAmber N Minarik, female    DOB: 09/29/1991  Age: 28 y.o. MRN: [id number]  CC:  Chief Complaint  Patient presents with  . New Patient (Initial Visit)    establish care  . Back Pain    started Monday, 6/10 pain    HPI Calvary N Poser 28 year old female patient new to the practice.  Here to establish her care.  [facility].  Receives her Pap smears there and is managed with birth control at there as well. No significant findings in her history, outside of what GYN has treated her for over the years.    Only medications include lo Loestrin Fe.  No allergies that she knows of.  Recently was treated for pneumonia on February 3 from the emergency room.  In January she was treated with a GI bug, from the emergency room.  And last November-December was treated for viral pharyngitis from the emergency room. She wishes to establish with a primary care provider, to avoid going to the emergency room from here on out.  Health maintenance: Is up-to-date on all vaccines, except flu which she refuses.  Receives Paps yearly secondary to abnormal finding on Pap by about a year ago.  It should be noted that she has had pneumonia twice one in 2016 and one this year.  Social: Lives with boyfriend, 2 children.  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  Reports not eating the most well-balanced diet, consuming several sodas throughout the day and limited water.  Reports that she wear sunscreen, seatbelt, has smoke detectors and carbon detectors in the house which she checks batteries regularly.  Denies drinking, and cigarette and substance abuse.  Daughter, Elberta LeatherwoodRyleigh present today in the office with her.  New onset of midline back pain for 3 days. Denies injury or trauma, that she is aware of. She works at [employer] and does stocking and lifting, but doesn't recall an issue that day.  Described as sharp and achy. This pain is located in the  lower back without radiation into the buttock and posterior thigh. Pain score today in office is rated 6/10. At its worse the pain is a level 7-8/10. It is not disturbing sleep and limiting movement.  It is relieved by nothing. It is aggravated by movement. Modifying factors have included Advil and tylenol. This helps reduce the pain level to a 3-4/10, but does not take it away completely.  There is no associated lower extremity numbness or weakness. There is no associated incontinence of stool or urine. Reports she does wear sneakers at work.  She reports having a HX of back strain in the past, this feels similar.   Past Medical, Surgical, Social History, Allergies, and Medications have been Reviewed.  Past Medical History:  Diagnosis Date  . Anxiety    past  . BV (bacterial vaginosis) 10/21/2014  . Itching in the vaginal area 12/07/2015  . LLQ pain 04/19/2015  . Missed period 04/19/2015  . Patient desires pregnancy 12/07/2015  . Pelvic pain in female 11/18/2014  . Pneumonia 2016, january; 12/2018   left side  . Screening for STD (sexually transmitted disease) 01/20/2015  . Vaginal discharge 10/21/2014    Past Surgical History:  Procedure Laterality Date  . TONSILLECTOMY    . WISDOM TOOTH EXTRACTION    . WISDOM TOOTH EXTRACTION      Family History  Problem Relation Age of Onset  . Heart attack  Maternal Grandmother   . Stroke Maternal Grandmother   . Dementia Maternal Grandmother   . Other Paternal Grandfather        brain aneursym  . Cancer Maternal Grandfather        bone,chest,liver,brain,pancreatic,throat  . Anesthesia problems Neg Hx   . Hypotension Neg Hx   . Malignant hyperthermia Neg Hx   . Pseudochol deficiency Neg Hx     Social History   Socioeconomic History  . Marital status: Single    Spouse name: Not on file  . Number of children: Not on file  . Years of education: Not on file  . Highest education level: Not on file  Occupational History  . Not on  file  Social Needs  . Financial resource strain: Not hard at all  . Food insecurity:    Worry: Never true    Inability: Never true  . Transportation needs:    Medical: No    Non-medical: No  Tobacco Use  . Smoking status: Never Smoker  . Smokeless tobacco: Never Used  Substance and Sexual Activity  . Alcohol use: No  . Drug use: No  . Sexual activity: Yes    Birth control/protection: Pill  Lifestyle  . Physical activity:    Days per week: 0 days    Minutes per session: 0 min  . Stress: Not at all  Relationships  . Social connections:    Talks on phone: Twice a week    Gets together: More than three times a week    Attends religious service: Never    Active member of club or organization: No    Attends meetings of clubs or organizations: Never    Relationship status: Never married  . Intimate partner violence:    Fear of current or ex partner: No    Emotionally abused: No    Physically abused: No    Forced sexual activity: No  Other Topics Concern  . Not on file  Social History Narrative   Lives with [name] and her two children      [name]- 95 years old (2020) birthday in [birth year]   [name]-1 years (2020) December 1st      Eats: veggies, meats, sweets, processed foods   Drink: Does not drink a lot of water. Sodas, OJ     Caffeine: 2-3 sodas      Wears sunscreen and seat belts.   Both smoke and carbon detectors in the home-checks batteries frequently.     ROS Review of Systems  Constitutional: Negative for activity change, appetite change, chills, fatigue and fever.  HENT: Negative for congestion, ear pain, rhinorrhea, sinus pressure, sinus pain, sneezing, sore throat, trouble swallowing and voice change.        Sees dentist yearly  Eyes: Negative for pain, discharge, itching and visual disturbance.       Wears glasses -eye dr yearly.   Respiratory: Negative for cough, choking, chest tightness and shortness of breath.   Cardiovascular: Negative for chest pain,  palpitations and leg swelling.  Gastrointestinal: Positive for constipation. Negative for abdominal pain, blood in stool, diarrhea, nausea and vomiting.  Endocrine: Negative for polydipsia, polyphagia and polyuria.  Genitourinary: Negative for difficulty urinating, dyspareunia, dysuria, frequency, hematuria, urgency, vaginal bleeding, vaginal discharge and vaginal pain.  Musculoskeletal: Positive for back pain.  Skin: Negative for rash and wound.  Allergic/Immunologic: Negative for food allergies.  Neurological: Negative for dizziness, tremors, seizures, weakness and headaches.  Psychiatric/Behavioral: Negative for confusion, decreased concentration and sleep  disturbance. The patient is not nervous/anxious.   All other systems reviewed and are negative.   Objective:   Today's Vitals: BP 118/73   Pulse 74   Resp 12   Ht 5\' 7"  (1.702 m)   Wt 156 lb 1.3 oz (70.8 kg)   SpO2 99% Comment: room air  BMI 24.45 kg/m   Physical Exam Vitals signs and nursing note reviewed.  Constitutional:      Appearance: Normal appearance. She is normal weight.  HENT:     Head: Normocephalic.     Right Ear: External ear normal.     Left Ear: External ear normal.     Nose: Nose normal.     Mouth/Throat:     Mouth: Mucous membranes are moist.     Pharynx: Oropharynx is clear.  Eyes:     Conjunctiva/sclera: Conjunctivae normal.  Neck:     Musculoskeletal: Normal range of motion and neck supple.  Cardiovascular:     Rate and Rhythm: Normal rate and regular rhythm.     Pulses: Normal pulses.     Heart sounds: Normal heart sounds.  Pulmonary:     Effort: Pulmonary effort is normal.     Breath sounds: Normal breath sounds.  Abdominal:     General: Bowel sounds are normal.     Palpations: Abdomen is soft.  Musculoskeletal:     Lumbar back: She exhibits tenderness, pain and spasm. She exhibits normal range of motion, no bony tenderness, no swelling, no edema and no deformity.     Right upper leg:  Normal.     Left upper leg: Normal.     Comments: Pain present with straight leg raise starting at 30 degrees to 90 degrees.   Negative for sciatica into sacral, buttocks, back of thigh.  Skin:    General: Skin is warm and dry.     Capillary Refill: Capillary refill takes less than 2 seconds.  Neurological:     Mental Status: She is alert and oriented to person, place, and time.     Motor: Motor function is intact.     Coordination: Coordination is intact.     Gait: Gait is intact.  Psychiatric:        Mood and Affect: Mood normal.        Behavior: Behavior normal.        Thought Content: Thought content normal.        Judgment: Judgment normal.     Assessment & Plan:   1. Acute midline low back pain without sciatica New onset acute midline low back pain without sciatica present during exam.  Straight leg raise induce some discomfort in the lower back.  Dispersed throughout the 90 to 30 degrees range.  Denied having any numbness, tingling, loss of bowel or bladder.  Full range of motion back, hip.  Previous history of having back strain, reports that this feels the same way.  Provided with acute back pain patient information AVS.  Provided with high-dose ibuprofen-educated on the side effects and use use of high-dose ibuprofen.  Provided with a muscle spasm medication Zanaflex short course.  Advised only use this as needed.  Out of work note for 2 days to return to work with limited lifting 10 to 20 pounds.  Advised to follow-up in 1 month to make sure that symptoms are not worsening.  - ibuprofen (ADVIL,MOTRIN) 800 MG tablet; Take 1 tablet (800 mg total) by mouth every 8 (eight) hours as needed.  Dispense: 30 tablet;  Refill: 0 - tiZANidine (ZANAFLEX) 4 MG capsule; Take 1 capsule (4 mg total) by mouth 2 (two) times daily as needed for up to 7 days for muscle spasms.  Dispense: 14 capsule; Refill: 0  2. Breakthrough bleeding on birth control pills Reports having breakthrough, irregular  Bleeding.  Is on birth control pills Reports that she does bleed at times midcycle.  She is [facility] the next [facility] to [facility] her Pap smear.  Advised her to ask what they believe would be a good substitution possible change in contraceptive.  Appreciate collaboration with [facility].  3. Healthcare maintenance Healthcare maintenance, patient is up-to-date on all vaccines, and requires screenings at this time.  Will be returning in 1 month for her Pap to [facility].  Does not have a thyroid, lipid, vitamin D on file.  Does not have a good diet would like to get baselines of all these to make sure that we know where she was at this time which she is establishing care.  - TSH - Lipid panel - VITAMIN D 25 Hydroxy (Vit-D Deficiency, Fractures)  4. Constipation, unspecified constipation type Reports mild occasional constipation.  Reports that she will go 4 to 5 days sometimes without having a bowel movement.  Visit she and his sister is like this as well.  But feels like she is to pass out when she does go to have a bowel movement.  Advised her to consider adding MiraLAX to her diet every 2nd-3rd day if she continues to have these problems to see if that can help promote regulation.  As well as encouraging her to eat more vegetables, and include more fiber, and drink more water   Follow-up: Return in about 1 month (around 02/21/2019) for annual, f/u on back, and labs.    Freddy Finner, NP

## 2019-01-21 NOTE — Patient Instructions (Addendum)
Thank you for coming into the office today. I appreciate the opportunity to provide you with the care for your health and wellness.  Pick up medications today to help with back pain. Please read and follow the below information to help with back pain.  Work note for 01/20/2019, return on 01/22/2019-limited lifting.   Acute Back Pain, Adult Acute back pain is sudden and usually short-lived. It is often caused by an injury to the muscles and tissues in the back. The injury may result from:  A muscle or ligament getting overstretched or torn (strained). Ligaments are tissues that connect bones to each other. Lifting something improperly can cause a back strain.  Wear and tear (degeneration) of the spinal disks. Spinal disks are circular tissue that provides cushioning between the bones of the spine (vertebrae).  Twisting motions, such as while playing sports or doing yard work.  A hit to the back.  Arthritis. You may have a physical exam, lab tests, and imaging tests to find the cause of your pain. Acute back pain usually goes away with rest and home care. Follow these instructions at home: Managing pain, stiffness, and swelling  Take over-the-counter and prescription medicines only as told by your health care provider.  Your health care provider may recommend applying ice during the first 24-48 hours after your pain starts. To do this: ? Put ice in a plastic bag. ? Place a towel between your skin and the bag. ? Leave the ice on for 20 minutes, 2-3 times a day.  If directed, apply heat to the affected area as often as told by your health care provider. Use the heat source that your health care provider recommends, such as a moist heat pack or a heating pad. ? Place a towel between your skin and the heat source. ? Leave the heat on for 20-30 minutes. ? Remove the heat if your skin turns bright red. This is especially important if you are unable to feel pain, heat, or cold. You have a  greater risk of getting burned. Activity   Do not stay in bed. Staying in bed for more than 1-2 days can delay your recovery.  Sit up and stand up straight. Avoid leaning forward when you sit, or hunching over when you stand. ? If you work at a desk, sit close to it so you do not need to lean over. Keep your chin tucked in. Keep your neck drawn back, and keep your elbows bent at a right angle. Your arms should look like the letter "L." ? Sit high and close to the steering wheel when you drive. Add lower back (lumbar) support to your car seat, if needed.  Take short walks on even surfaces as soon as you are able. Try to increase the length of time you walk each day.  Do not sit, drive, or stand in one place for more than 30 minutes at a time. Sitting or standing for long periods of time can put stress on your back.  Do not drive or use heavy machinery while taking prescription pain medicine.  Use proper lifting techniques. When you bend and lift, use positions that put less stress on your back: ? [place] your knees. ? Keep the load close to your body. ? Avoid twisting.  Exercise regularly as told by your health care provider. Exercising helps your back heal faster and helps prevent back injuries by keeping muscles strong and flexible.  Work with a physical therapist to make  a safe exercise program, as recommended by your health care provider. Do any exercises as told by your physical therapist. Lifestyle  Maintain a healthy weight. Extra weight puts stress on your back and makes it difficult to have good posture.  Avoid activities or situations that make you feel anxious or stressed. Stress and anxiety increase muscle tension and can make back pain worse. Learn ways to manage anxiety and stress, such as through exercise. General instructions  Sleep on a firm mattress in a comfortable position. Try lying on your side with your knees slightly bent. If you lie on your back, put a pillow under  your knees.  Follow your treatment plan as told by your health care provider. This may include: ? Cognitive or behavioral therapy. ? Acupuncture or massage therapy. ? Meditation or yoga. Contact a health care provider if:  You have pain that is not relieved with rest or medicine.  You have increasing pain going down into your legs or buttocks.  Your pain does not improve after 2 weeks.  You have pain at night.  You lose weight without trying.  You have a fever or chills. Get help right away if:  You develop new bowel or bladder control problems.  You have unusual weakness or numbness in your arms or legs.  You develop nausea or vomiting.  You develop abdominal pain.  You feel faint. Summary  Acute back pain is sudden and usually short-lived.  Use proper lifting techniques. When you bend and lift, use positions that put less stress on your back.  Take over-the-counter and prescription medicines and apply heat or ice as directed by your health care provider. This information is not intended to replace advice given to you by your health care provider. Make sure you discuss any questions you have with your health care provider. Document Released: 11/05/2005 Document Revised: 06/12/2018 Document Reviewed: 06/19/2017 Elsevier Interactive Patient Education  2019 [employer]  Contact [facility] for PAP and change of Birth control medication due to irregular bleeding.  It was a pleasure to see you and I look forward to continuing to work together on your health and well-being. Please do not hesitate to call the office if you need care or have questions about your care.  Have a wonderful day and week.  With Gratitude,  Tereasa Coop, DNP, AGNP-BC

## 2019-02-04 ENCOUNTER — Ambulatory Visit: Payer: BLUE CROSS/BLUE SHIELD | Admitting: Family Medicine

## 2019-02-11 ENCOUNTER — Ambulatory Visit (INDEPENDENT_AMBULATORY_CARE_PROVIDER_SITE_OTHER): Payer: BLUE CROSS/BLUE SHIELD | Admitting: Family Medicine

## 2019-02-11 ENCOUNTER — Encounter: Payer: Self-pay | Admitting: Family Medicine

## 2019-02-11 ENCOUNTER — Other Ambulatory Visit: Payer: Self-pay

## 2019-02-11 VITALS — BP 118/70 | HR 75 | Temp 97.8°F | Resp 14 | Ht 67.0 in | Wt 159.4 lb

## 2019-02-11 DIAGNOSIS — K120 Recurrent oral aphthae: Secondary | ICD-10-CM

## 2019-02-11 NOTE — Patient Instructions (Signed)
    Thank you for coming into the office today. I appreciate the opportunity to provide you with the care for your health and wellness. Today we discussed: you have what appears to be canker sore.  Get the OTC treatment we discussed.   Get new toothpaste and stop mouth wash for now.  Avoid hot, spicy, or hard (crunch) items food wise for next several days.   If more come up or if OTC does not work, I can send in Lidocaine Swish   WASH YOUR HANDS WELL AND FREQUENTLY. AVOID TOUCHING YOUR FACE, UNLESS YOUR HANDS ARE FRESHLY WASHED.   GET FRESH AIR DAILY. STAY HYDRATED WITH WATER.   It was a pleasure to see you and I look forward to continuing to work together on your health and well-being. Please do not hesitate to call the office if you need care or have questions about your care.  Have a wonderful day and week.  With Gratitude,  Tereasa Coop, DNP, AGNP-BC

## 2019-02-11 NOTE — Progress Notes (Signed)
History was provided by the patient.  Tiffany Goodwin is a 28 y.o. female who is here for sore throat       PCP confirmed? Yes.    Tiffany Finner, NP  HPI: Tiffany Goodwin is a 28 year old patient of mine.  She comes in today secondary to having ongoing 2-day sore throat pain.  White spot that had come up on the end of her hard/soft top of throat/back of mouth.  Reports that it feels funny and is uncomfortable when swallowing.  Hurts when eating and swallowing.  Nothing really has relieved it.  It is a continuous 3-4 level especially when swallowing.  Denies having any fevers, chills, congestion, ear pain, sinus pain or pressure.  Denies having a cough, shortness of breath or wheezing.  Denies having chest pain.  Denies having any other troubles or symptoms today in the office.  Reports that her only symptom mainly is a sore throat along with the discomfort and spot that she is seen.  Past Medical, Surgical, Social History, Allergies, and Medications have been Reviewed.   Review of Systems  Constitutional: Negative for chills and fever.  HENT: Positive for sore throat. Negative for congestion, ear discharge, ear pain and sinus pain.   Eyes: Negative.   Respiratory: Negative for cough, shortness of breath and wheezing.   Cardiovascular: Negative for chest pain.  Gastrointestinal: Negative.   Genitourinary: Negative.   Musculoskeletal: Negative.   Skin: Negative for itching and rash.  Neurological: Negative for dizziness and headaches.  Endo/Heme/Allergies: Negative.   Psychiatric/Behavioral: Negative.   All other systems reviewed and are negative.    Patient Active Problem List   Diagnosis Date Noted  . Abnormal Pap smear of cervix 04/03/2018  . Spider vein of right lower extremity 09/04/2017    Current Outpatient Medications on File Prior to Visit  Medication Sig Dispense Refill  . ibuprofen (ADVIL,MOTRIN) 800 MG tablet Take 1 tablet (800 mg total) by mouth every 8 (eight)  hours as needed. 30 tablet 0  . Norethindrone-Ethinyl Estradiol-Fe Biphas (LO LOESTRIN FE) 1 MG-10 MCG / 10 MCG tablet Take 1 tablet by mouth daily. 3 Package 3   No current facility-administered medications on file prior to visit.     No Known Allergies  Physical Exam:    Vitals:   02/11/19 1453  BP: 118/70  Pulse: 75  Resp: 14  Temp: 97.8 F (36.6 C)  TempSrc: Oral  SpO2: 98%  Weight: 159 lb 6.4 oz (72.3 kg)  Height: 5\' 7"  (1.702 m)   Physical Exam  Constitutional: She is oriented to person, place, and time and well-developed, well-nourished, and in no distress.  HENT:  Head: Normocephalic.  Right Ear: Hearing, tympanic membrane, external ear and ear canal normal.  Left Ear: Hearing, tympanic membrane, external ear and ear canal normal.  Nose: Nose normal. No mucosal edema. Right sinus exhibits no maxillary sinus tenderness and no frontal sinus tenderness. Left sinus exhibits no maxillary sinus tenderness and no frontal sinus tenderness.  Mouth/Throat: Uvula is midline and mucous membranes are normal. Oral lesions present. No trismus in the jaw. No uvula swelling or lacerations. No oropharyngeal exudate, posterior oropharyngeal edema, posterior oropharyngeal erythema or tonsillar abscesses.  Noted 1, less than 0.5 cm oral lesion (aphthous stomastitis) at the back of the left side hard palate going into the soft palate of the throat.  Erythema is extended around the site. No noted masses seen.  No tonsils noted as she has had  tonsillectomy.  No other exudate noted in the back of the throat.  Membranes appear clear moist and pink.  Eyes: Right eye exhibits no discharge. Left eye exhibits no discharge. No scleral icterus.  Neck: Normal range of motion. Neck supple.  Full range of motion in the neck.  Underneath jawline she has some increased swelling, no noted lymph nodes.  Cardiovascular: Normal rate, regular rhythm and normal heart sounds.  Pulmonary/Chest: Effort normal and  breath sounds normal. No respiratory distress. She has no wheezes. She has no rales. She exhibits no tenderness.  Lymphadenopathy:    She has no cervical adenopathy.  Neurological: She is alert and oriented to person, place, and time.  Skin: Skin is warm and dry.  Psychiatric: Mood, memory, affect and judgment normal.  Nursing note and vitals reviewed.   Assessment/Plan:  1. Aphthous stomatitis Appears to have stomatitis.  Educated about the use of certain toothpaste, nonalcohol mouthwashes.  Educated about the use of possible being allergic to something that she ate.  And/or making sure she chews her food well so that nothing hurts or harms her mouth where she could cause a sore.  Additionally educated about use of oral-gel treatment over-the-counter versus lidocaine swish orally.  She opted for over-the-counter at this time.  Will send in lidocaine swish if this does not resolve or starts getting worse.  Follow-Up: As needed  Tiffany Finner, DNP, AGNP-BC [facility] [address], [address] [place], [place] [address] Office Hours: Mon-Thurs 8 am-5 pm; Fri 8 am-12 pm Office Phone:  [phone]  Office Fax: [phone]

## 2019-04-17 ENCOUNTER — Ambulatory Visit (INDEPENDENT_AMBULATORY_CARE_PROVIDER_SITE_OTHER): Payer: BLUE CROSS/BLUE SHIELD | Admitting: Family Medicine

## 2019-04-17 ENCOUNTER — Encounter: Payer: Self-pay | Admitting: Family Medicine

## 2019-04-17 ENCOUNTER — Other Ambulatory Visit: Payer: Self-pay

## 2019-04-17 VITALS — BP 115/74 | HR 88 | Temp 98.6°F | Ht 67.0 in | Wt 160.0 lb

## 2019-04-17 DIAGNOSIS — L03312 Cellulitis of back [any part except buttock]: Secondary | ICD-10-CM

## 2019-04-17 MED ORDER — DOXYCYCLINE HYCLATE 100 MG PO TABS: 100.0000 mg | ORAL_TABLET | Freq: Two times a day (BID) | ORAL | 0 refills | Status: AC

## 2019-04-17 NOTE — Patient Instructions (Addendum)
Thank you for coming into the office today. I appreciate the opportunity to provide you with the care for your health and wellness. Today we discussed: Skin infection on your back  Please take the medications that were sent in today and full course.  Make sure you are staying hydrated and drinking lots of water while you are taking the medication as well.  Complete the full course after completion of the full course if you do not have resolution of your infection or the boil does not go away or returns please let me know.  Look to refrain from using the tanning bed during this treatment time.  Additionally it would be advised for you not to worry about going to the tanning bed as this can expose you to risk of skin cancer.  Additionally I have attached some information for boil care in the future.  In case you do become exposed to this again or have issues with this again for treatment at home.  Please continue to practice social distancing during this time to keep you, your family and our community safe.  If you must go out please continue to wear a mask and practice good hand hygiene.  Please note that we are here for you just reach out if you need us.   WASH YOUR HANDS WELL AND FREQUENTLY. AVOID TOUCHING YOUR FACE, UNLESS YOUR HANDS ARE FRESHLY WASHED.  GET FRESH AIR DAILY. STAY HYDRATED WITH WATER.   It was a pleasure to see you and I look forward to continuing to work together on your health and well-being. Please do not hesitate to call the office if you need care or have questions about your care.  Have a wonderful day and week. With Gratitude, Tereasa CoopHannah Chelsia Serres, DNP, AGNP-BC   Skin Abscess  A skin abscess is an infected area of your skin that contains pus and other material. An abscess can happen in any part of your body. Some abscesses break open (rupture) on their own. Most continue to get worse unless they are treated. The infection can spread deeper into the body and into  your blood, which can make you feel sick. A skin abscess is caused by germs that enter the skin through a cut or scrape. It can also be caused by blocked oil and sweat glands or infected hair follicles. This condition is usually treated by:  Draining the pus.  Taking antibiotic medicines.  Placing a warm, wet washcloth over the abscess. Follow these instructions at home: Medicines   Take over-the-counter and prescription medicines only as told by your doctor.  If you were prescribed an antibiotic medicine, take it as told by your doctor. Do not stop taking the antibiotic even if you start to feel better. Abscess care   If you have an abscess that has not drained, place a warm, clean, wet washcloth over the abscess several times a day. Do this as told by your doctor.  Follow instructions from your doctor about how to take care of your abscess. Make sure you: ? Cover the abscess with a bandage (dressing). ? Change your bandage or gauze as told by your doctor. ? Wash your hands with soap and water before you change the bandage or gauze. If you cannot use soap and water, use hand sanitizer.  Check your abscess every day for signs that the infection is getting worse. Check for: ? More redness, swelling, or pain. ? More fluid or blood. ? Warmth. ? More pus or  a bad smell. General instructions  To avoid spreading the infection: ? Do not share personal care items, towels, or hot tubs with others. ? Avoid making skin-to-skin contact with other people.  Keep all follow-up visits as told by your doctor. This is important. Contact a doctor if:  You have more redness, swelling, or pain around your abscess.  You have more fluid or blood coming from your abscess.  Your abscess feels warm when you touch it.  You have more pus or a bad smell coming from your abscess.  You have a fever.  Your muscles ache.  You have chills.  You feel sick. Get help right away if:  You have very  bad (severe) pain.  You see red streaks on your skin spreading away from the abscess. Summary  A skin abscess is an infected area of your skin that contains pus and other material.  The abscess is caused by germs that enter the skin through a cut or scrape. It can also be caused by blocked oil and sweat glands or infected hair follicles.  Follow your doctor's instructions on caring for your abscess, taking medicines, preventing infections, and keeping follow-up visits. This information is not intended to replace advice given to you by your health care provider. Make sure you discuss any questions you have with your health care provider. Document Released: 04/23/2008 Document Revised: 12/19/2017 Document Reviewed: 12/19/2017 Elsevier Interactive Patient Education  2019 [employer].

## 2019-04-17 NOTE — Progress Notes (Signed)
Subjective:     Patient ID: Tiffany Goodwin, female   DOB: Tiffany 03, 1992, 28 y.o.   MRN: [id number]  Tiffany Goodwin presents for abscess/boil/cellulitis on her lower left back.  Reports that she had been going to the tanning bed a couple times over the last week or so.  Then she noticed this.  She tried to use warm compresses to bring it to it to see if it would resolve on its own but it did not.  She tried to keep it covered so that it would not get bumped or hit with her pants and or with movement.  But that is not helped.  She reports that it is gotten bigger over the last couple days to week.  And it is become more painful.  Denies having any drainage.  She denies having any injury prior to the start of the infection.  She denies any radiating pain into her leg upper back around her side.  She denies having a history of having affection of this nature before.  Additionally she reports that she has been having increasing headaches.  But she reports that she is reduced her amount of sugar and caffeine intake.  Is trying to wean herself off of drinking so much caffeine and then gain so much sugar.  She denies having any fever, chills, any signs or symptoms of infection today.  She denies having cough, shortness of breath, chest pain or leg swelling.   Past Medical, Surgical, Social History, Allergies, and Medications have been Reviewed.    Past Medical History:  Diagnosis Date  . Anxiety    past  . BV (bacterial vaginosis) 10/21/2014  . Itching in the vaginal area 12/07/2015  . LLQ pain 04/19/2015  . Missed period 04/19/2015  . Patient desires pregnancy 12/07/2015  . Pelvic pain in female 11/18/2014  . Pneumonia 2016, january; 12/2018   left side  . Screening for STD (sexually transmitted disease) 01/20/2015  . Vaginal discharge 10/21/2014   Past Surgical History:  Procedure Laterality Date  . TONSILLECTOMY    . WISDOM TOOTH EXTRACTION    . WISDOM TOOTH EXTRACTION     Social History    Socioeconomic History  . Marital status: Single    Spouse name: Not on file  . Number of children: Not on file  . Years of education: Not on file  . Highest education level: Not on file  Occupational History  . Not on file  Social Needs  . Financial resource strain: Not hard at all  . Food insecurity:    Worry: Never true    Inability: Never true  . Transportation needs:    Medical: No    Non-medical: No  Tobacco Use  . Smoking status: Never Smoker  . Smokeless tobacco: Never Used  Substance and Sexual Activity  . Alcohol use: No  . Drug use: No  . Sexual activity: Yes    Birth control/protection: Pill  Lifestyle  . Physical activity:    Days per week: 0 days    Minutes per session: 0 min  . Stress: Not at all  Relationships  . Social connections:    Talks on phone: Twice a week    Gets together: More than three times a week    Attends religious service: Never    Active member of club or organization: No    Attends meetings of clubs or organizations: Never    Relationship status: Never married  . Intimate partner violence:  Fear of current or ex partner: No    Emotionally abused: No    Physically abused: No    Forced sexual activity: No  Other Topics Concern  . Not on file  Social History Narrative   Lives with Hessie KnowsOtis and her two children      [name]- 28 years old (2020) birthday in [birth year]   [name]-1 years (2020) [birth year] 1st      Eats: veggies, meats, sweets, processed foods   Drink: Does not drink a lot of water. Sodas, OJ     Caffeine: 2-3 sodas      Wears sunscreen and seat belts.   Both smoke and carbon detectors in the home-checks batteries frequently.     Outpatient Encounter Medications as of 04/17/2019  Medication Sig  . doxycycline (VIBRA-TABS) 100 MG tablet Take 1 tablet (100 mg total) by mouth 2 (two) times daily for 7 days.  Marland Kitchen. ibuprofen (ADVIL,MOTRIN) 800 MG tablet Take 1 tablet (800 mg total) by mouth every 8 (eight) hours as needed.  .  Norethindrone-Ethinyl Estradiol-Fe Biphas (LO LOESTRIN FE) 1 MG-10 MCG / 10 MCG tablet Take 1 tablet by mouth daily.   No facility-administered encounter medications on file as of 04/17/2019.    No Known Allergies  Review of Systems  Constitutional: Negative.   HENT: Negative.   Eyes: Negative.   Respiratory: Negative.   Cardiovascular: Negative.   Gastrointestinal: Negative.   Endocrine: Negative.   Genitourinary: Negative.   Musculoskeletal: Negative.   Skin: Positive for wound.  Allergic/Immunologic: Negative.   Neurological: Positive for headaches.  Hematological: Negative.   Psychiatric/Behavioral: Negative.   All other systems reviewed and are negative.      Objective:     BP 115/74 (BP Location: Right Arm, Patient Position: Sitting, Cuff Size: Normal)   Pulse 88   Temp 98.6 F (37 C)   Ht 5\' 7"  (1.702 m)   Wt 160 lb 0.6 oz (72.6 kg)   BMI 25.07 kg/m   Physical Exam Vitals signs and nursing note reviewed.  Constitutional:      Appearance: Normal appearance. She is obese.  HENT:     Head: Normocephalic and atraumatic.     Right Ear: External ear normal.     Left Ear: External ear normal.     Nose: Nose normal.  Neck:     Musculoskeletal: Normal range of motion.  Cardiovascular:     Rate and Rhythm: Normal rate and regular rhythm.     Pulses: Normal pulses.     Heart sounds: Normal heart sounds.  Pulmonary:     Effort: Pulmonary effort is normal.     Breath sounds: Normal breath sounds.  Musculoskeletal: Normal range of motion.  Skin:    General: Skin is warm and dry.     Capillary Refill: Capillary refill takes less than 2 seconds.       Neurological:     Mental Status: She is alert and oriented to person, place, and time.  Psychiatric:        Mood and Affect: Mood normal.        Behavior: Behavior normal.        Thought Content: Thought content normal.        Judgment: Judgment normal.        Assessment and Plan        1. Cellulitis of  back except buttock Signs and symptoms today are consistent with cellulitis of an abscess on the back.  Provided with doxycycline  advised information regarding taking his medication and completing the full course.  Reviewed side effects, risks and benefits of medication.  Patient acknowledged agreement and understanding of the plan.  She is educated to return if she does not have any resolution of her infection.  She is also educated on the signs and symptoms of increased infection that might make her have to go to the emergency room over the weekend.  - doxycycline (VIBRA-TABS) 100 MG tablet; Take 1 tablet (100 mg total) by mouth 2 (two) times daily for 7 days.  Dispense: 14 tablet; Refill: 0   Follow-up PRN  Freddy Finner, DNP, AGNP-BC [facility] [address] [place], [place] [address] Office Hours: Mon-Thurs 8 am-5 pm; Fri 8 am-12 pm Office Phone:  [phone]  Office Fax: [phone]

## 2019-10-14 ENCOUNTER — Other Ambulatory Visit: Payer: Self-pay | Admitting: Women's Health

## 2019-10-19 ENCOUNTER — Telehealth: Payer: Self-pay | Admitting: *Deleted

## 2019-10-19 NOTE — Telephone Encounter (Signed)
Pt is requesting a refill on her birth control

## 2019-10-19 NOTE — Telephone Encounter (Signed)
PA submitted. Unlikely to be approved per [employer]. Will forward to Kim to send in alternate.

## 2019-12-28 ENCOUNTER — Other Ambulatory Visit: Payer: Self-pay

## 2019-12-28 ENCOUNTER — Ambulatory Visit: Payer: BC Managed Care – PPO | Attending: Internal Medicine

## 2019-12-28 DIAGNOSIS — Z20822 Contact with and (suspected) exposure to covid-19: Secondary | ICD-10-CM

## 2019-12-29 LAB — NOVEL CORONAVIRUS, NAA: SARS-CoV-2, NAA: NOT DETECTED

## 2020-01-10 ENCOUNTER — Other Ambulatory Visit: Payer: Self-pay | Admitting: Women's Health

## 2020-01-11 ENCOUNTER — Telehealth: Payer: Self-pay | Admitting: *Deleted

## 2020-01-11 NOTE — Telephone Encounter (Signed)
Patient called requesting refill on BCP.  Informed refill was sent but advised to make appt for p/p for further refills. Pt verbalized understanding and stated she would call our office back to make appt.

## 2020-02-03 ENCOUNTER — Telehealth: Payer: Self-pay | Admitting: *Deleted

## 2020-02-03 NOTE — Telephone Encounter (Signed)
Attempted to call patient back.  No answer.

## 2020-02-03 NOTE — Telephone Encounter (Signed)
Pt left message that she would like to switch her birth control.

## 2020-02-17 ENCOUNTER — Other Ambulatory Visit: Payer: Self-pay

## 2020-02-17 ENCOUNTER — Encounter: Payer: Self-pay | Admitting: Family Medicine

## 2020-02-17 ENCOUNTER — Other Ambulatory Visit (HOSPITAL_COMMUNITY)
Admission: RE | Admit: 2020-02-17 | Discharge: 2020-02-17 | Disposition: A | Discharge status: Home / Self Care | Payer: BC Managed Care – PPO | Source: Ambulatory Visit | Attending: Family Medicine | Admitting: Family Medicine

## 2020-02-17 ENCOUNTER — Ambulatory Visit (INDEPENDENT_AMBULATORY_CARE_PROVIDER_SITE_OTHER): Payer: BC Managed Care – PPO | Admitting: Family Medicine

## 2020-02-17 VITALS — BP 119/72 | HR 75 | Ht 67.0 in | Wt 167.0 lb

## 2020-02-17 DIAGNOSIS — Z01419 Encounter for gynecological examination (general) (routine) without abnormal findings: Secondary | ICD-10-CM | POA: Diagnosis present

## 2020-02-17 DIAGNOSIS — Z01411 Encounter for gynecological examination (general) (routine) with abnormal findings: Secondary | ICD-10-CM | POA: Diagnosis not present

## 2020-02-17 DIAGNOSIS — R87610 Atypical squamous cells of undetermined significance on cytologic smear of cervix (ASC-US): Secondary | ICD-10-CM

## 2020-02-17 DIAGNOSIS — R87810 Cervical high risk human papillomavirus (HPV) DNA test positive: Secondary | ICD-10-CM

## 2020-02-17 DIAGNOSIS — Z793 Long term (current) use of hormonal contraceptives: Secondary | ICD-10-CM

## 2020-02-17 DIAGNOSIS — N926 Irregular menstruation, unspecified: Secondary | ICD-10-CM

## 2020-02-17 DIAGNOSIS — Z1151 Encounter for screening for human papillomavirus (HPV): Secondary | ICD-10-CM

## 2020-02-17 MED ORDER — NORETHIN ACE-ETH ESTRAD-FE 1-20 MG-MCG(24) PO TABS: 1.0000 | ORAL_TABLET | Freq: Every day | ORAL | 11 refills | Status: DC

## 2020-02-17 NOTE — Progress Notes (Signed)
GYNECOLOGY ANNUAL PREVENTATIVE CARE ENCOUNTER NOTE  History:     Tiffany Goodwin is a 29 y.o. [phone] female here for a routine annual gynecologic exam.  Current complaints: reports some irregular periods and spotting; requests different OCP. Denies abnormal vaginal bleeding, discharge, pelvic pain, problems with intercourse or other gynecologic concerns.    Gynecologic History No LMP recorded. (Menstrual status: Oral contraceptives). 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 Colpo done with CIN I-II; recommended repeat Pap in 1 year. Last mammogram: NA by age. No family hx of breast cancer.  Obstetric History OB History  Gravida Para Term Preterm AB Living  2 2 2  0 0 2  SAB TAB Ectopic Multiple Live Births  0 0 0 0 2    # Outcome Date GA Lbr Len/2nd Weight Sex Delivery Anes PTL Lv  2 Term 10/19/17 [redacted]w[redacted]d 17:50 / 02:43 8 lb 7.5 oz (3.841 kg) M Vag-Spont EPI  LIV  1 Term 11/26/11 100w0d 09:06 / 01:00 6 lb 13.8 oz (3.113 kg) F Vag-Vacuum EPI  LIV     Birth Comments: caput, moulding    Past Medical History:  Diagnosis Date  . Anxiety    past  . BV (bacterial vaginosis) 10/21/2014  . Itching in the vaginal area 12/07/2015  . LLQ pain 04/19/2015  . Missed period 04/19/2015  . Patient desires pregnancy 12/07/2015  . Pelvic pain in female 11/18/2014  . Pneumonia 2016, january; 12/2018   left side  . Screening for STD (sexually transmitted disease) 01/20/2015  . Vaginal discharge 10/21/2014    Past Surgical History:  Procedure Laterality Date  . TONSILLECTOMY    . WISDOM TOOTH EXTRACTION    . WISDOM TOOTH EXTRACTION      Current Outpatient Medications on File Prior to Visit  Medication Sig Dispense Refill  . ibuprofen (ADVIL,MOTRIN) 800 MG tablet Take 1 tablet (800 mg total) by mouth every 8 (eight) hours as needed. 30 tablet 0   No current facility-administered medications on file prior to visit.    No Known Allergies  Social  History:  reports that she has never smoked. She has never used smokeless tobacco. She reports that she does not drink alcohol or use drugs.  Family History  Problem Relation Age of Onset  . Heart attack Maternal Grandmother   . Stroke Maternal Grandmother   . Dementia Maternal Grandmother   . Other Paternal Grandfather        brain aneursym  . Cancer Maternal Grandfather        bone,chest,liver,brain,pancreatic,throat  . Anesthesia problems Neg Hx   . Hypotension Neg Hx   . Malignant hyperthermia Neg Hx   . Pseudochol deficiency Neg Hx     The following portions of the patient's history were reviewed and updated as appropriate: allergies, current medications, past family history, past medical history, past social history, past surgical history and problem list.  Review of Systems Pertinent items noted in HPI and remainder of comprehensive ROS otherwise negative.  Physical Exam:  BP 119/72 (BP Location: Left Arm, Patient Position: Sitting, Cuff Size: Normal)   Pulse 75   Ht 5\' 7"  (1.702 m)   Wt 167 lb (75.8 kg)   BMI 26.16 kg/m  CONSTITUTIONAL: Well-developed, well-nourished female in no acute distress.  HEENT:  Normocephalic, atraumatic. External right and left ear normal. No scleral icterus.  NECK: Normal range of motion SKIN: No rash noted. Not diaphoretic. No erythema. No pallor. BREAST: No abnormalities, masses,  dimpling, discharge, swollen lymph nodes bilaterally.  MUSCULOSKELETAL: Normal range of motion. No edema noted. NEUROLOGIC: Alert and oriented to person, place, and time. Normal muscle tone coordination. No cranial nerve deficit noted. PSYCHIATRIC: Normal mood and affect. Normal behavior. Normal judgment and thought content. CARDIOVASCULAR: Normal heart rate noted RESPIRATORY: Effort  normal, no problems with respiration noted ABDOMEN: Soft, non-tender. PELVIC: Normal appearing external genitalia; normal appearing vaginal mucosa and cervix.  No abnormal discharge  noted.  Normal uterine size, no other palpable masses, no uterine or adnexal tenderness.   Assessment and Plan:  Conor was seen today for gynecologic exam.  Diagnoses and all orders for this visit:  Encounter for gynecological examination with Papanicolaou smear of cervix -     Cytology - PAP( [facility]) -     Norethindrone Acetate-Ethinyl Estrad-FE (LOESTRIN 24 FE) 1-20 MG-MCG(24) tablet; Take 1 tablet by mouth daily.       - Last Pap: May 2019 with pos HPV and ASCUS. Colpo done with CIN I-II; recommended repeat Pap in 1 year.       - Discussed all methods of contraception. Patient desires to continue with OCP but different formulation. Discontinued Lo Loestrin and will try Loestrin.   Will follow up results of pap smear and manage accordingly. Routine preventative health maintenance measures emphasized. Please refer to After Visit Summary for other counseling recommendations.      Jerilynn Birkenhead, MD [facility] Fellow, [facility] for [employer], [facility]

## 2020-02-22 ENCOUNTER — Telehealth: Payer: Self-pay | Admitting: *Deleted

## 2020-02-22 ENCOUNTER — Telehealth: Payer: Self-pay | Admitting: Advanced Practice Midwife

## 2020-02-22 NOTE — Telephone Encounter (Signed)
Patient informed pap results not back yet. Informed it may take a few more days due to the holiday. Pt verbalized understanding.

## 2020-02-22 NOTE — Telephone Encounter (Signed)
Pt seen [facility] last week and is calling for test results. Please call pt

## 2020-02-23 LAB — CYTOLOGY - PAP
Chlamydia: NEGATIVE
Comment: NEGATIVE
Comment: NEGATIVE
Comment: NEGATIVE
Comment: NEGATIVE
Comment: NORMAL
Diagnosis: UNDETERMINED — AB
HPV 16: NEGATIVE
HPV 18 / 45: NEGATIVE
High risk HPV: POSITIVE — AB
Neisseria Gonorrhea: NEGATIVE

## 2020-02-25 ENCOUNTER — Ambulatory Visit (INDEPENDENT_AMBULATORY_CARE_PROVIDER_SITE_OTHER): Payer: BC Managed Care – PPO | Admitting: Obstetrics and Gynecology

## 2020-02-25 ENCOUNTER — Other Ambulatory Visit: Payer: Self-pay

## 2020-02-25 ENCOUNTER — Encounter: Payer: Self-pay | Admitting: Obstetrics and Gynecology

## 2020-02-25 VITALS — BP 118/67 | HR 80 | Ht 67.0 in | Wt 166.0 lb

## 2020-02-25 DIAGNOSIS — Z3202 Encounter for pregnancy test, result negative: Secondary | ICD-10-CM

## 2020-02-25 LAB — POCT URINE PREGNANCY: Preg Test, Ur: NEGATIVE

## 2020-02-25 NOTE — Progress Notes (Signed)
Patient ID: Tiffany Goodwin, female   DOB: 1991/06/09, 30 y.o.   MRN: [id number]   Tiffany Goodwin 29 y.o. [id number] here for colposcopy for ASCUS with POSITIVE high risk HPV pap smear on 02/17/2020.  Discussed role for HPV in cervical dysplasia, need for surveillance.  Patient given informed consent, signed copy in the chart, time out was performed.  Placed in lithotomy position. Cervix viewed with speculum and colposcope after application of acetic acid.   Colposcopy adequate? Yes  no visible lesions, no mosaicism, no punctation and no abnormal vasculature; no biopsies obtained.  No ECC specimen obtained.   Colposcopy IMPRESSION: no visible dysplasia  Patient was given post procedure instructions. Will follow up pathology and manage accordingly.  Routine preventative health maintenance measures emphasized.   By signing my name below, I, [employer], attest that this documentation has been prepared under the direction and in the presence of [name], MD. Electronically Signed: Mal Misty Medical Scribe. 02/25/20. 4:21 PM.  I personally performed the services described in this documentation, which was SCRIBED in my presence. The recorded information has been reviewed and considered accurate. It has been edited as necessary during review. [name], MD

## 2020-03-16 ENCOUNTER — Encounter: Payer: Self-pay | Admitting: Family Medicine

## 2020-03-16 ENCOUNTER — Telehealth (INDEPENDENT_AMBULATORY_CARE_PROVIDER_SITE_OTHER): Payer: BC Managed Care – PPO | Admitting: Family Medicine

## 2020-03-16 ENCOUNTER — Other Ambulatory Visit: Payer: Self-pay

## 2020-03-16 VITALS — BP 118/67 | Ht 67.0 in | Wt 166.0 lb

## 2020-03-16 DIAGNOSIS — F419 Anxiety disorder, unspecified: Secondary | ICD-10-CM

## 2020-03-16 DIAGNOSIS — F410 Panic disorder [episodic paroxysmal anxiety] without agoraphobia: Secondary | ICD-10-CM | POA: Insufficient documentation

## 2020-03-16 MED ORDER — BUSPIRONE HCL 5 MG PO TABS: 5.0000 mg | ORAL_TABLET | Freq: Two times a day (BID) | ORAL | 1 refills | Status: DC

## 2020-03-16 NOTE — Progress Notes (Signed)
Virtual Visit via Video Note   This visit type was conducted due to national recommendations for restrictions regarding the COVID-19 Pandemic (e.g. social distancing) in an effort to limit this patient's exposure and mitigate transmission in our community.  Due to her co-morbid illnesses, this patient is at least at moderate risk for complications without adequate follow up.  This format is felt to be most appropriate for this patient at this time.  All issues noted in this document were discussed and addressed.  A limited physical exam was performed with this format.    Evaluation Performed:  Follow-up visit  Date:  03/16/2020   ID:  Tiffany Goodwin, DOB 1991/05/06, MRN [id number]  Patient Location: Home Provider Location: Office  Location of Patient: Home Location of Provider: Telehealth Consent was obtain for visit to be over via telehealth. I verified that I am speaking with the correct person using two identifiers.  PCP:  Perlie Mayo, NP   Chief Complaint: Anxiety  History of Present Illness:    Tiffany Goodwin is a 29 y.o. female with history of anxiety.  Presents today as she has some upcoming appointments that are stressful for her secondary to having abnormal Pap smear.  During the appointment she reports that she feels hot and dizzy and very nervous.  She reports that she passed out at her last appointment earlier this month.  She reports that she does not know how to necessarily control this feeling she knows that she should not always feel like this but she cannot help it.  She is not on anything at this time for anxiety.  Is willing to take something to help get it under control.  The patient does not have symptoms concerning for COVID-19 infection (fever, chills, cough, or new shortness of breath).   Past Medical, Surgical, Social History, Allergies, and Medications have been Reviewed.  Past Medical History:  Diagnosis Date  . Anxiety    past  . BV (bacterial  vaginosis) 10/21/2014  . Itching in the vaginal area 12/07/2015  . LLQ pain 04/19/2015  . Missed period 04/19/2015  . Patient desires pregnancy 12/07/2015  . Pelvic pain in female 11/18/2014  . Pneumonia 2016, january; 12/2018   left side  . Screening for STD (sexually transmitted disease) 01/20/2015  . Vaginal discharge 10/21/2014   Past Surgical History:  Procedure Laterality Date  . TONSILLECTOMY    . WISDOM TOOTH EXTRACTION    . WISDOM TOOTH EXTRACTION       Current Meds  Medication Sig  . Norethindrone Acetate-Ethinyl Estrad-FE (LOESTRIN 24 FE) 1-20 MG-MCG(24) tablet Take 1 tablet by mouth daily.     Allergies:   Patient has no known allergies.   ROS:   Please see the history of present illness.     All other systems reviewed and are negative.   Labs/Other Tests and Data Reviewed:    Recent Labs: No results found for requested labs within last 8760 hours.   Recent Lipid Panel No results found for: CHOL, TRIG, HDL, CHOLHDL, LDLCALC, LDLDIRECT  Wt Readings from Last 3 Encounters:  03/16/20 166 lb (75.3 kg)  02/25/20 166 lb (75.3 kg)  02/17/20 167 lb (75.8 kg)     Depression screen [facility] 2/9 03/16/2020 02/17/2020 02/11/2019 01/21/2019 03/31/2018  Decreased Interest 0 0 0 0 0  Down, Depressed, Hopeless 0 0 0 0 0  PHQ - 2 Score 0 0 0 0 0  Altered sleeping 1 0 - - 1  Tired, decreased energy 1 0 - - 1  Change in appetite 0 0 - - 0  Feeling bad or failure about yourself  0 0 - - 0  Trouble concentrating 0 0 - - 0  Moving slowly or fidgety/restless 0 0 - - 0  Suicidal thoughts 0 0 - - 0  PHQ-9 Score 2 0 - - 2  Difficult doing work/chores Not difficult at all - - - Not difficult at all      Objective:    Vital Signs:  BP 118/67   Ht 5\' 7"  (1.702 m)   Wt 166 lb (75.3 kg)   BMI 26.00 kg/m    VITAL SIGNS:  reviewed GEN:  no acute distress EYES:  sclerae anicteric, EOMI - Extraocular Movements Intact RESPIRATORY:  normal respiratory effort, symmetric expansion SKIN:   no rash, lesions or ulcers. MUSCULOSKELETAL:  no obvious deformities. NEURO:  alert and oriented x 3, no obvious focal deficit PSYCH:  normal affect   GAD 7 : Generalized Anxiety Score 03/16/2020 02/17/2020  Nervous, Anxious, on Edge 1 0  Control/stop worrying 3 0  Worry too much - different things 1 0  Trouble relaxing 0 0  Restless 0 0  Easily annoyed or irritable 3 0  Afraid - awful might happen 1 0  Total GAD 7 Score 9 0  Anxiety Difficulty Somewhat difficult -     ASSESSMENT & PLAN:    1. Anxiety  - busPIRone (BUSPAR) 5 MG tablet; Take 1 tablet (5 mg total) by mouth 2 (two) times daily.  Dispense: 60 tablet; Refill: 1   Time:   Today, I have spent 10 minutes with the patient with telehealth technology discussing the above problems.     Medication Adjustments/Labs and Tests Ordered: Current medicines are reviewed at length with the patient today.  Concerns regarding medicines are outlined above.   Tests Ordered: No orders of the defined types were placed in this encounter.   Medication Changes: No orders of the defined types were placed in this encounter.   Disposition:  Follow up 8 weeks  Signed, 02/19/2020, NP  03/16/2020 11:08 AM     03/18/2020 [facility]

## 2020-03-16 NOTE — Assessment & Plan Note (Signed)
Elevated GAD at 9.  Usually only related to appointments.  Reports that every other day during the week she is pretty okay not having any issues or concerns.  We will start her on BuSpar to see how she does advised her to take this regularly.  If she feels she only needs it once a day that is fine did give the option for twice daily.  But advised that it is a daily medication.  Reviewed side effects, risks and benefits of medication.   Patient acknowledged agreement and understanding of the plan.

## 2020-03-16 NOTE — Patient Instructions (Addendum)
I appreciate the opportunity to provide you with care for your health and wellness. Today we discussed: anxiety   Follow up: 8 weeks-anxiety  No labs or referrals today  Please use BuSpar daily as directed on the container. If having questions or concerns or trouble with medication please not hesitate to reach out.  Please continue to practice social distancing to keep you, your family, and our community safe.  If you must go out, please wear a mask and practice good handwashing.  It was a pleasure to see you and I look forward to continuing to work together on your health and well-being. Please do not hesitate to call the office if you need care or have questions about your care.  Have a wonderful day and week. With Gratitude, Tereasa Coop, DNP, AGNP-BC

## 2020-05-11 ENCOUNTER — Other Ambulatory Visit: Payer: Self-pay

## 2020-05-11 ENCOUNTER — Telehealth (INDEPENDENT_AMBULATORY_CARE_PROVIDER_SITE_OTHER): Payer: BC Managed Care – PPO | Admitting: Family Medicine

## 2020-05-11 ENCOUNTER — Encounter: Payer: Self-pay | Admitting: Family Medicine

## 2020-05-11 VITALS — BP 118/67 | Ht 67.0 in | Wt 166.0 lb

## 2020-05-11 DIAGNOSIS — F320 Major depressive disorder, single episode, mild: Secondary | ICD-10-CM

## 2020-05-11 DIAGNOSIS — F419 Anxiety disorder, unspecified: Secondary | ICD-10-CM

## 2020-05-11 MED ORDER — ESCITALOPRAM OXALATE 10 MG PO TABS: 10.0000 mg | ORAL_TABLET | Freq: Every day | ORAL | 0 refills | Status: DC

## 2020-05-11 NOTE — Patient Instructions (Signed)
I appreciate the opportunity to provide you with care for your health and wellness. Today we discussed: mood   Follow up: 10 weeks for mood   No labs or referrals today  Start taking Lexapro daily. Remember it might make anxiety worse for the first couple of weeks, this is an expected side effect that will ease off. Call if you have any trouble with this.  I hope that you feel better soon and that is is helpful for you.   Please continue to practice social distancing to keep you, your family, and our community safe.  If you must go out, please wear a mask and practice good handwashing.  It was a pleasure to see you and I look forward to continuing to work together on your health and well-being. Please do not hesitate to call the office if you need care or have questions about your care.  Have a wonderful day and week. With Gratitude, Tereasa Coop, DNP, AGNP-BC

## 2020-05-11 NOTE — Assessment & Plan Note (Signed)
GAD is still elevated but not as elevated as it was.  We will start Lexapro 10 mg daily.  Have advised her on the signs and symptoms of increased anxiety for the first couple weeks of starting this.  If she would like to restart BuSpar at this time she can.  She is advised to call back if she has any trouble before next appointment. Reviewed side effects, risks and benefits of medication.   Patient acknowledged agreement and understanding of the plan.

## 2020-05-11 NOTE — Assessment & Plan Note (Signed)
Denies having any SI or HI at this time.  We will start Lexapro 10 mg daily.  Educated on the side effects of the medication and the use of BuSpar as needed for the first couple weeks. Patient acknowledged agreement and understanding of the plan.

## 2020-05-11 NOTE — Progress Notes (Signed)
Virtual Visit via Telephone Note   This visit type was conducted due to national recommendations for restrictions regarding the COVID-19 Pandemic (e.g. social distancing) in an effort to limit this patient's exposure and mitigate transmission in our community.  Due to her co-morbid illnesses, this patient is at least at moderate risk for complications without adequate follow up.  This format is felt to be most appropriate for this patient at this time.  The patient did not have access to video technology/had technical difficulties with video requiring transitioning to audio format only (telephone).  All issues noted in this document were discussed and addressed.  No physical exam could be performed with this format.    Evaluation Performed:  Follow-up visit  Date:  05/11/2020   ID:  Tiffany Goodwin, DOB 1991/11/10, MRN [id number]  Patient Location: Home Provider Location: Office  Location of Patient: Home Location of Provider: Telehealth Consent was obtain for visit to be over via telehealth. I verified that I am speaking with the correct person using two identifiers.  PCP:  Perlie Mayo, NP   Chief Complaint: Anxiety and depression  History of Present Illness:    Tiffany Goodwin is a 29 y.o. female with history of anxiety.   Previously it was thought that most of her anxiety was driving from having to do appointments medically.  However she reports that she continues to feel anxious some slight depression.  Denies having any SI or HI.  Is willing to have medication change.  But reports that the BuSpar did not help her much.    The patient does not have symptoms concerning for COVID-19 infection (fever, chills, cough, or new shortness of breath).   Past Medical, Surgical, Social History, Allergies, and Medications have been Reviewed.  Past Medical History:  Diagnosis Date  . Anxiety    past  . BV (bacterial vaginosis) 10/21/2014  . Itching in the vaginal area 12/07/2015  . LLQ  pain 04/19/2015  . Missed period 04/19/2015  . Patient desires pregnancy 12/07/2015  . Pelvic pain in female 11/18/2014  . Pneumonia 2016, january; 12/2018   left side  . Screening for STD (sexually transmitted disease) 01/20/2015  . Vaginal discharge 10/21/2014   Past Surgical History:  Procedure Laterality Date  . TONSILLECTOMY    . WISDOM TOOTH EXTRACTION    . WISDOM TOOTH EXTRACTION       Current Meds  Medication Sig  . busPIRone (BUSPAR) 5 MG tablet Take 1 tablet (5 mg total) by mouth 2 (two) times daily.  . Norethindrone Acetate-Ethinyl Estrad-FE (LOESTRIN 24 FE) 1-20 MG-MCG(24) tablet Take 1 tablet by mouth daily.     Allergies:   Patient has no known allergies.   ROS:   Please see the history of present illness.    All other systems reviewed and are negative.   Labs/Other Tests and Data Reviewed:    Recent Labs: No results found for requested labs within last 8760 hours.   Recent Lipid Panel No results found for: CHOL, TRIG, HDL, CHOLHDL, LDLCALC, LDLDIRECT  Wt Readings from Last 3 Encounters:  05/11/20 166 lb (75.3 kg)  03/16/20 166 lb (75.3 kg)  02/25/20 166 lb (75.3 kg)     Objective:    Vital Signs:  BP 118/67   Ht 5\' 7"  (1.702 m)   Wt 166 lb (75.3 kg)   BMI 26.00 kg/m    VITAL SIGNS:  reviewed GEN:  Alert and oriented RESPIRATORY:  No shortness of  breath noted in conversation PSYCH:  Flat affect and mood  Depression screen [facility] 2/9 05/11/2020 03/16/2020 02/17/2020 02/11/2019 01/21/2019  Decreased Interest 0 0 0 0 0  Down, Depressed, Hopeless 0 0 0 0 0  PHQ - 2 Score 0 0 0 0 0  Altered sleeping 0 1 0 - -  Tired, decreased energy 1 1 0 - -  Change in appetite 0 0 0 - -  Feeling bad or failure about yourself  0 0 0 - -  Trouble concentrating 0 0 0 - -  Moving slowly or fidgety/restless 0 0 0 - -  Suicidal thoughts 0 0 0 - -  PHQ-9 Score 1 2 0 - -  Difficult doing work/chores Somewhat difficult Not difficult at all - - -   GAD 7 : Generalized Anxiety  Score 05/11/2020 03/16/2020 02/17/2020  Nervous, Anxious, on Edge 1 1 0  Control/stop worrying 0 3 0  Worry too much - different things 1 1 0  Trouble relaxing 0 0 0  Restless 0 0 0  Easily annoyed or irritable 3 3 0  Afraid - awful might happen 0 1 0  Total GAD 7 Score 5 9 0  Anxiety Difficulty Somewhat difficult Somewhat difficult -     ASSESSMENT & PLAN:    1. Depression, major, single episode, mild (HCC)   2. Anxiety - escitalopram (LEXAPRO) 10 MG tablet; Take 1 tablet (10 mg total) by mouth daily.  Dispense: 90 tablet; Refill: 0   Time:   Today, I have spent 10 minutes with the patient with telehealth technology discussing the above problems.     Medication Adjustments/Labs and Tests Ordered: Current medicines are reviewed at length with the patient today.  Concerns regarding medicines are outlined above.   Tests Ordered: No orders of the defined types were placed in this encounter.   Medication Changes: No orders of the defined types were placed in this encounter.   Disposition:  Follow up 10 weeks Signed, Freddy Finner, NP  05/11/2020 2:06 PM     [facility]

## 2020-06-07 ENCOUNTER — Encounter: Payer: Self-pay | Admitting: Family Medicine

## 2020-06-13 ENCOUNTER — Telehealth: Payer: BC Managed Care – PPO | Admitting: Family

## 2020-06-13 DIAGNOSIS — J069 Acute upper respiratory infection, unspecified: Secondary | ICD-10-CM | POA: Diagnosis not present

## 2020-06-13 MED ORDER — FLUTICASONE PROPIONATE 50 MCG/ACT NA SUSP: 2.0000 | Freq: Every day | NASAL | 6 refills | Status: DC

## 2020-06-13 MED ORDER — BENZONATATE 100 MG PO CAPS: 100.0000 mg | ORAL_CAPSULE | Freq: Three times a day (TID) | ORAL | 0 refills | Status: DC | PRN

## 2020-06-13 NOTE — Progress Notes (Signed)

## 2020-06-15 ENCOUNTER — Ambulatory Visit: Payer: BC Managed Care – PPO | Admitting: Family Medicine

## 2020-07-20 ENCOUNTER — Other Ambulatory Visit: Payer: Self-pay

## 2020-07-20 ENCOUNTER — Telehealth (INDEPENDENT_AMBULATORY_CARE_PROVIDER_SITE_OTHER): Payer: BC Managed Care – PPO | Admitting: Family Medicine

## 2020-07-20 ENCOUNTER — Encounter: Payer: Self-pay | Admitting: Family Medicine

## 2020-07-20 DIAGNOSIS — F419 Anxiety disorder, unspecified: Secondary | ICD-10-CM

## 2020-07-20 MED ORDER — BUSPIRONE HCL 10 MG PO TABS: 10.0000 mg | ORAL_TABLET | Freq: Three times a day (TID) | ORAL | 0 refills | Status: DC

## 2020-07-20 NOTE — Progress Notes (Signed)
Virtual Visit via Telephone Note   This visit type was conducted due to national recommendations for restrictions regarding the COVID-19 Pandemic (e.g. social distancing) in an effort to limit this patient's exposure and mitigate transmission in our community.  Due to her co-morbid illnesses, this patient is at least at moderate risk for complications without adequate follow up.  This format is felt to be most appropriate for this patient at this time.  The patient did not have access to video technology/had technical difficulties with video requiring transitioning to audio format only (telephone).  All issues noted in this document were discussed and addressed.  No physical exam could be performed with this format.    Evaluation Performed:  Follow-up visit  Date:  07/20/2020   ID:  Tiffany Goodwin, DOB [birth year], MRN [id number]  Patient Location: Home Provider Location: Office/Clinic  Location of Patient: Home Location of Provider: Telehealth Consent was obtain for visit to be over via telehealth. I verified that I am speaking with the correct person using two identifiers.  PCP:  Freddy Finner, NP   Chief Complaint: Anxiety  History of Present Illness:    Tiffany Goodwin is a 29 y.o. female with history of anxiety, depression.  Reports today that her Lexapro was just too sedating for her.  She reports that she was unable to function with it.  She took it for about 8 weeks without any changes so she stopped it.  Previous use of BuSpar low-dose twice daily was not very helpful.  She reports that her anxiety is flared up especially right now as her sister is in the hospital with Covid.  And she and all of her family are having to be tested.  She is willing to try changes in medication regimen.  With close follow-up.  Denies having any SI or HI in conversation today.  The patient does not have symptoms concerning for COVID-19 infection (fever, chills, cough, or new shortness of breath).    Past Medical, Surgical, Social History, Allergies, and Medications have been Reviewed.  Past Medical History:  Diagnosis Date  . Abnormal Pap smear of cervix 04/03/2018   03/31/18: ASCUS/HPV   Colpo___  . Anxiety    past  . BV (bacterial vaginosis) 10/21/2014  . Itching in the vaginal area 12/07/2015  . LLQ pain 04/19/2015  . Missed period 04/19/2015  . Patient desires pregnancy 12/07/2015  . Pelvic pain in female 11/18/2014  . Pneumonia 2016, january; 12/2018   left side  . Screening for STD (sexually transmitted disease) 01/20/2015  . Spider vein of right lower extremity 09/04/2017  . Vaginal discharge 10/21/2014   Past Surgical History:  Procedure Laterality Date  . TONSILLECTOMY    . WISDOM TOOTH EXTRACTION    . WISDOM TOOTH EXTRACTION       Current Meds  Medication Sig  . escitalopram (LEXAPRO) 10 MG tablet Take 1 tablet (10 mg total) by mouth daily.  . Norethindrone Acetate-Ethinyl Estrad-FE (LOESTRIN 24 FE) 1-20 MG-MCG(24) tablet Take 1 tablet by mouth daily.     Allergies:   Patient has no known allergies.   ROS:   Please see the history of present illness.    All other systems reviewed and are negative.   Labs/Other Tests and Data Reviewed:    Recent Labs: No results found for requested labs within last 8760 hours.   Recent Lipid Panel No results found for: CHOL, TRIG, HDL, CHOLHDL, LDLCALC, LDLDIRECT  Wt Readings from Last  3 Encounters:  07/20/20 166 lb (75.3 kg)  05/11/20 166 lb (75.3 kg)  03/16/20 166 lb (75.3 kg)     Objective:    Vital Signs:  BP 118/67   Ht 5\' 7"  (1.702 m)   Wt 166 lb (75.3 kg)   BMI 26.00 kg/m    VITAL SIGNS:  reviewed GEN:  Alert and oriented RESPIRATORY:  normal respiratory effort, symmetric expansion PSYCH:  Normal affect  Depression screen [facility] 2/9 07/20/2020 05/11/2020 03/16/2020 02/17/2020 02/11/2019  Decreased Interest 0 0 0 0 0  Down, Depressed, Hopeless 0 0 0 0 0  PHQ - 2 Score 0 0 0 0 0  Altered sleeping 0 0 1 0 -   Tired, decreased energy 2 1 1  0 -  Change in appetite 0 0 0 0 -  Feeling bad or failure about yourself  0 0 0 0 -  Trouble concentrating 0 0 0 0 -  Moving slowly or fidgety/restless 0 0 0 0 -  Suicidal thoughts 0 0 0 0 -  PHQ-9 Score 2 1 2  0 -  Difficult doing work/chores Not difficult at all Somewhat difficult Not difficult at all - -   GAD 7 : Generalized Anxiety Score 07/20/2020 05/11/2020 03/16/2020 02/17/2020  Nervous, Anxious, on Edge 1 1 1  0  Control/stop worrying 3 0 3 0  Worry too much - different things 3 1 1  0  Trouble relaxing 0 0 0 0  Restless 0 0 0 0  Easily annoyed or irritable 2 3 3  0  Afraid - awful might happen 0 0 1 0  Total GAD 7 Score 9 5 9  0  Anxiety Difficulty Somewhat difficult Somewhat difficult Somewhat difficult -     ASSESSMENT & PLAN:   1. Anxiety  - busPIRone (BUSPAR) 10 MG tablet; Take 1 tablet (10 mg total) by mouth 3 (three) times daily.  Dispense: 90 tablet; Refill: 0   Time:   Today, I have spent 7 minutes with the patient with telehealth technology discussing the above problems.     Medication Adjustments/Labs and Tests Ordered: Current medicines are reviewed at length with the patient today.  Concerns regarding medicines are outlined above.   Tests Ordered: No orders of the defined types were placed in this encounter.   Medication Changes: No orders of the defined types were placed in this encounter.   Disposition:  Follow up 3 weeks Signed, 05/13/2020, NP  07/20/2020 2:23 PM     02/19/2020 [facility]

## 2020-07-20 NOTE — Patient Instructions (Addendum)
I appreciate the opportunity to provide you with care for your health and wellness. Today we discussed: anxiety   Follow up: 3 weeks by phone for anxiety   No labs or referrals today  Med adjustment today please take as directed. I hope your sister is okay and that your family test negative for COVID.  Please continue to practice social distancing to keep you, your family, and our community safe.  If you must go out, please wear a mask and practice good handwashing.  It was a pleasure to see you and I look forward to continuing to work together on your health and well-being. Please do not hesitate to call the office if you need care or have questions about your care.  Have a wonderful day and week. With Gratitude, Tereasa Coop, DNP, AGNP-BC

## 2020-07-20 NOTE — Assessment & Plan Note (Signed)
GAD still elevated.  Lexapro was not beneficial for her.  Will increase BuSpar and start 3 times a day.  Reviewed side effects risks and benefits of medication.  And patient is agreement with plan will follow up in 3 weeks.

## 2020-08-10 ENCOUNTER — Other Ambulatory Visit: Payer: Self-pay

## 2020-08-10 ENCOUNTER — Telehealth (INDEPENDENT_AMBULATORY_CARE_PROVIDER_SITE_OTHER): Payer: BC Managed Care – PPO | Admitting: Family Medicine

## 2020-08-10 ENCOUNTER — Encounter: Payer: Self-pay | Admitting: Family Medicine

## 2020-08-10 DIAGNOSIS — F419 Anxiety disorder, unspecified: Secondary | ICD-10-CM

## 2020-08-10 MED ORDER — BUSPIRONE HCL 10 MG PO TABS: 10.0000 mg | ORAL_TABLET | Freq: Three times a day (TID) | ORAL | 0 refills | Status: DC

## 2020-08-10 NOTE — Progress Notes (Signed)
Virtual Visit via Telephone Note   This visit type was conducted due to national recommendations for restrictions regarding the COVID-19 Pandemic (e.g. social distancing) in an effort to limit this patient's exposure and mitigate transmission in our community.  Due to her co-morbid illnesses, this patient is at least at moderate risk for complications without adequate follow up.  This format is felt to be most appropriate for this patient at this time.  The patient did not have access to video technology/had technical difficulties with video requiring transitioning to audio format only (telephone).  All issues noted in this document were discussed and addressed.  No physical exam could be performed with this format.  Evaluation Performed:  Follow-up visit  Date:  08/10/2020   ID:  Tiffany Goodwin, DOB [birth year], MRN [id number]  Patient Location: Home Provider Location: Office/Clinic  Location of Patient: Home Location of Provider: Telehealth Consent was obtain for visit to be over via telehealth. I verified that I am speaking with the correct person using two identifiers.  PCP:  Freddy Finner, NP   Chief Complaint:  Anxiety follow up   History of Present Illness:    Tiffany Goodwin is a 29 y.o. female with history of anxiety, depression  Reports today that she thinks the BuSpar is helping some she was started twice a day because she got a little woozy with it.  But now she is up to 3 times a day.  Her GAD and PHQ scores are much improved.  But she still reports having some anxiety throughout the day.  She especially reports that she is afraid that she might go to the dentist and still have anxiety.  So she would like to try that out.  She would like to continue the medication for now and she was explained that the medication can take up to 12 weeks for full effect.  She is willing to continue it for the next 8 weeks and then follow back up.  She denies having any SI or HI in conversation  today.  The patient does not have symptoms concerning for COVID-19 infection (fever, chills, cough, or new shortness of breath).   Past Medical, Surgical, Social History, Allergies, and Medications have been Reviewed.  Past Medical History:  Diagnosis Date  . Abnormal Pap smear of cervix 04/03/2018   03/31/18: ASCUS/HPV   Colpo___  . Anxiety    past  . BV (bacterial vaginosis) 10/21/2014  . Itching in the vaginal area 12/07/2015  . LLQ pain 04/19/2015  . Missed period 04/19/2015  . Patient desires pregnancy 12/07/2015  . Pelvic pain in female 11/18/2014  . Pneumonia 2016, january; 12/2018   left side  . Screening for STD (sexually transmitted disease) 01/20/2015  . Spider vein of right lower extremity 09/04/2017  . Vaginal discharge 10/21/2014   Past Surgical History:  Procedure Laterality Date  . TONSILLECTOMY    . WISDOM TOOTH EXTRACTION    . WISDOM TOOTH EXTRACTION       Current Meds  Medication Sig  . busPIRone (BUSPAR) 10 MG tablet Take 1 tablet (10 mg total) by mouth 3 (three) times daily.  . Norethindrone Acetate-Ethinyl Estrad-FE (LOESTRIN 24 FE) 1-20 MG-MCG(24) tablet Take 1 tablet by mouth daily.     Allergies:   Patient has no known allergies.   ROS:   Please see the history of present illness.    All other systems reviewed and are negative.   Labs/Other Tests and Data Reviewed:  Recent Labs: No results found for requested labs within last 8760 hours.   Recent Lipid Panel No results found for: CHOL, TRIG, HDL, CHOLHDL, LDLCALC, LDLDIRECT  Wt Readings from Last 3 Encounters:  08/10/20 166 lb (75.3 kg)  07/20/20 166 lb (75.3 kg)  05/11/20 166 lb (75.3 kg)     Objective:    Vital Signs:  BP 118/67   Ht 5\' 7"  (1.702 m)   Wt 166 lb (75.3 kg)   BMI 26.00 kg/m    VITAL SIGNS:  reviewed GEN:  Alert and oriented RESPIRATORY:  No shortness of breath noted in conversation PSYCH:  normal affect  GAD 7 : Generalized Anxiety Score 08/10/2020 07/20/2020  05/11/2020 03/16/2020  Nervous, Anxious, on Edge 0 1 1 1   Control/stop worrying 0 3 0 3  Worry too much - different things 0 3 1 1   Trouble relaxing 1 0 0 0  Restless 0 0 0 0  Easily annoyed or irritable 1 2 3 3   Afraid - awful might happen 0 0 0 1  Total GAD 7 Score 2 9 5 9   Anxiety Difficulty Not difficult at all Somewhat difficult Somewhat difficult Somewhat difficult    Depression screen [facility] 2/9 07/20/2020 05/11/2020 03/16/2020 02/17/2020 02/11/2019  Decreased Interest 0 0 0 0 0  Down, Depressed, Hopeless 0 0 0 0 0  PHQ - 2 Score 0 0 0 0 0  Altered sleeping 0 0 1 0 -  Tired, decreased energy 2 1 1  0 -  Change in appetite 0 0 0 0 -  Feeling bad or failure about yourself  0 0 0 0 -  Trouble concentrating 0 0 0 0 -  Moving slowly or fidgety/restless 0 0 0 0 -  Suicidal thoughts 0 0 0 0 -  PHQ-9 Score 2 1 2  0 -  Difficult doing work/chores Not difficult at all Somewhat difficult Not difficult at all - -    ASSESSMENT & PLAN:    1. Anxiety - busPIRone (BUSPAR) 10 MG tablet; Take 1 tablet (10 mg total) by mouth 3 (three) times daily.  Dispense: 90 tablet; Refill: 0  Time:   Today, I have spent 5 minutes with the patient with telehealth technology discussing the above problems.     Medication Adjustments/Labs and Tests Ordered: Current medicines are reviewed at length with the patient today.  Concerns regarding medicines are outlined above.   Tests Ordered: No orders of the defined types were placed in this encounter.   Medication Changes: No orders of the defined types were placed in this encounter.   Disposition:  Follow up 8 weeks  Signed, 09/19/2020, NP  08/10/2020 4:08 PM     03/18/2020 [facility]

## 2020-08-10 NOTE — Patient Instructions (Addendum)
I appreciate the opportunity to provide you with care for your health and wellness. Today we discussed: anxiety  Follow up: 8 weeks anxiety   No labs or referrals today  Continue medication as ordered Call if you need anything prior to appt  Please continue to practice social distancing to keep you, your family, and our community safe.  If you must go out, please wear a mask and practice good handwashing.  It was a pleasure to see you and I look forward to continuing to work together on your health and well-being. Please do not hesitate to call the office if you need care or have questions about your care.  Have a wonderful day and week. With Gratitude, Tereasa Coop, DNP, AGNP-BC

## 2020-08-16 ENCOUNTER — Ambulatory Visit: Payer: BC Managed Care – PPO | Admitting: Family Medicine

## 2020-09-12 ENCOUNTER — Other Ambulatory Visit: Payer: Self-pay | Admitting: Family Medicine

## 2020-09-12 DIAGNOSIS — F419 Anxiety disorder, unspecified: Secondary | ICD-10-CM

## 2020-09-14 ENCOUNTER — Other Ambulatory Visit: Payer: Self-pay | Admitting: Family Medicine

## 2020-09-14 DIAGNOSIS — F419 Anxiety disorder, unspecified: Secondary | ICD-10-CM

## 2020-09-15 MED ORDER — BUSPIRONE HCL 10 MG PO TABS: 10.0000 mg | ORAL_TABLET | Freq: Three times a day (TID) | ORAL | 0 refills | Status: DC

## 2020-09-29 ENCOUNTER — Other Ambulatory Visit: Payer: BC Managed Care – PPO

## 2020-09-29 ENCOUNTER — Telehealth (INDEPENDENT_AMBULATORY_CARE_PROVIDER_SITE_OTHER): Payer: BC Managed Care – PPO | Admitting: Family Medicine

## 2020-09-29 ENCOUNTER — Encounter: Payer: Self-pay | Admitting: Family Medicine

## 2020-09-29 ENCOUNTER — Other Ambulatory Visit: Payer: Self-pay

## 2020-09-29 VITALS — Ht 67.0 in | Wt 170.0 lb

## 2020-09-29 DIAGNOSIS — Z20822 Contact with and (suspected) exposure to covid-19: Secondary | ICD-10-CM

## 2020-09-29 DIAGNOSIS — J069 Acute upper respiratory infection, unspecified: Secondary | ICD-10-CM

## 2020-09-29 MED ORDER — FLUTICASONE PROPIONATE 50 MCG/ACT NA SUSP: 2.0000 | Freq: Every day | NASAL | 0 refills | Status: DC

## 2020-09-29 MED ORDER — PROMETHAZINE-DM 6.25-15 MG/5ML PO SYRP: 2.5000 mL | ORAL_SOLUTION | Freq: Four times a day (QID) | ORAL | 0 refills | Status: DC | PRN

## 2020-09-29 NOTE — Patient Instructions (Addendum)
  HAPPY FALL!  I appreciate the opportunity to provide you with care for your health and wellness. Today we discussed: Sinus symptoms  Follow up: As scheduled  No labs or referrals today  Get tested for Covid.  Work note from today until return on Monday.  If your cough progresses any start producing drainage please pick up some Mucinex and take twice daily for 7 days.  Pick up cough syrup and take as directed.  Remember this can make you sleepy. Use Flonase nasal spray to help with nasal congestion and symptoms.  Hope that you feel better.  Please continue to practice social distancing to keep you, your family, and our community safe.  If you must go out, please wear a mask and practice good handwashing.  It was a pleasure to see you and I look forward to continuing to work together on your health and well-being. Please do not hesitate to call the office if you need care or have questions about your care.  Have a wonderful day and week. With Gratitude, Tereasa Coop, DNP, AGNP-BC

## 2020-09-29 NOTE — Progress Notes (Signed)
Virtual Visit via Telephone Note   This visit type was conducted due to national recommendations for restrictions regarding the COVID-19 Pandemic (e.g. social distancing) in an effort to limit this patient's exposure and mitigate transmission in our community.  Due to her co-morbid illnesses, this patient is at least at moderate risk for complications without adequate follow up.  This format is felt to be most appropriate for this patient at this time.  The patient did not have access to video technology/had technical difficulties with video requiring transitioning to audio format only (telephone).  All issues noted in this document were discussed and addressed.  No physical exam could be performed with this format.    Evaluation Performed:  Follow-up visit  Date:  09/29/2020   ID:  Tiffany Goodwin, DOB [birth year], MRN [id number]  Patient Location: Home Provider Location: Office/Clinic  Location of Patient: Home Location of Provider: Telehealth Consent was obtain for visit to be over via telehealth. I verified that I am speaking with the correct person using two identifiers.  PCP:  Freddy Finner, NP   Chief Complaint: Nasal symptoms  History of Present Illness:    Tiffany Goodwin is a 29 y.o. female with history as stated below.  Presents today for a acute visit via MyChart.  She reports that about 3 days ago she started having symptom onset of increased nasal drainage.  She denies having any fevers or chills.  She reports a sore throat only with swallowing.  She does have a slight cough or clearing of her throat that just started.  Denies having itchy throat or eyes.  Does report some increased sneezing.  Has stuffiness in between congestion.  Denies having any chest congestion at this time.  Denies having any body aches or pains.  But was very tired yesterday and slept for half the day.  She has not had any recent exposure herself from Covid but her daughter did have some exposure over  the weekend.  That individual tested negative that was exposed that exposed her however her symptoms started within 2 days of her being back home.  And now her her son and her daughter are all having the same symptoms.  She is willing to go get Covid tested.  And needs work note until cleared.  She denies having any shortness of breath or chest pain.  The patient does not have symptoms concerning for COVID-19 infection (fever, chills, cough, or new shortness of breath).   Past Medical, Surgical, Social History, Allergies, and Medications have been Reviewed.  Past Medical History:  Diagnosis Date  . Abnormal Pap smear of cervix 04/03/2018   03/31/18: ASCUS/HPV   Colpo___  . Anxiety    past  . BV (bacterial vaginosis) 10/21/2014  . Itching in the vaginal area 12/07/2015  . LLQ pain 04/19/2015  . Missed period 04/19/2015  . Patient desires pregnancy 12/07/2015  . Pelvic pain in female 11/18/2014  . Pneumonia 2016, january; 12/2018   left side  . Screening for STD (sexually transmitted disease) 01/20/2015  . Spider vein of right lower extremity 09/04/2017  . Vaginal discharge 10/21/2014   Past Surgical History:  Procedure Laterality Date  . TONSILLECTOMY    . WISDOM TOOTH EXTRACTION    . WISDOM TOOTH EXTRACTION       Current Meds  Medication Sig  . busPIRone (BUSPAR) 10 MG tablet Take 1 tablet (10 mg total) by mouth 3 (three) times daily.  . Norethindrone Acetate-Ethinyl Estrad-FE (  LOESTRIN 24 FE) 1-20 MG-MCG(24) tablet Take 1 tablet by mouth daily.     Allergies:   Patient has no known allergies.   ROS:   Please see the history of present illness.    All other systems reviewed and are negative.   Labs/Other Tests and Data Reviewed:    Recent Labs: No results found for requested labs within last 8760 hours.   Recent Lipid Panel No results found for: CHOL, TRIG, HDL, CHOLHDL, LDLCALC, LDLDIRECT  Wt Readings from Last 3 Encounters:  09/29/20 170 lb (77.1 kg)  08/10/20 166 lb  (75.3 kg)  07/20/20 166 lb (75.3 kg)     Objective:    Vital Signs:  Ht 5\' 7"  (1.702 m)   Wt 170 lb (77.1 kg)   BMI 26.63 kg/m    VITAL SIGNS:  reviewed GEN:  no acute distress RESPIRATORY:  no shortness of breath on conversation  PSYCH:  normal affect and mood   ASSESSMENT & PLAN:    1. Viral URI  Covid test encouraged Work note provided Symptom management discussed  - promethazine-dextromethorphan (PROMETHAZINE-DM) 6.25-15 MG/5ML syrup; Take 2.5 mLs by mouth 4 (four) times daily as needed for cough.  Dispense: 118 mL; Refill: 0 - fluticasone (FLONASE) 50 MCG/ACT nasal spray; Place 2 sprays into both nostrils daily.  Dispense: 16 g; Refill: 0  Time:   Today, I have spent 5 minutes with the patient with telehealth technology discussing the above problems.     Medication Adjustments/Labs and Tests Ordered: Current medicines are reviewed at length with the patient today.  Concerns regarding medicines are outlined above.   Tests Ordered: No orders of the defined types were placed in this encounter.   Medication Changes: No orders of the defined types were placed in this encounter.    Note: This dictation was prepared with Dragon dictation along with smaller phrase technology. Similar sounding words can be transcribed inadequately or may not be corrected upon review. Any transcriptional errors that result from this process are unintentional.      Disposition:  Follow up Visit date not found Signed, 01-21-1991, NP  09/29/2020 9:26 AM     13/09/2020 [facility]

## 2020-09-30 ENCOUNTER — Telehealth: Payer: Self-pay

## 2020-09-30 LAB — NOVEL CORONAVIRUS, NAA: SARS-CoV-2, NAA: NOT DETECTED

## 2020-09-30 LAB — SARS-COV-2, NAA 2 DAY TAT

## 2020-09-30 NOTE — Telephone Encounter (Signed)
Noted, will extend note as needed. Thank you for follow up

## 2020-09-30 NOTE — Telephone Encounter (Signed)
Advised patient of treatment plan with verbal understanding. She stated she thinks she will need the work note extended. Advised her to speak to her employer and let us know Monday.

## 2020-09-30 NOTE — Telephone Encounter (Signed)
Patient called stating her throat is still hurting really bad, gets worse at night, cries because it hurts to swallow. Covid test came back negative however her son's test was positive.

## 2020-09-30 NOTE — Telephone Encounter (Signed)
Most likely she might be developing COVID as well and should be tested again in 2 days.  Salt water gargles are best for sore throats. Pick up some lozenges that numb the throat will help or Chloraseptic-like spray. Use the cough syrup as well. Tylenol for the pain. LOTS OF FLUIDS. This is most likely viral related and does not need antibiotic. However if she worsens, she might need urgent care visit to check for strep to be on safe side.

## 2020-09-30 NOTE — Telephone Encounter (Signed)
Patient lvm stating she had told Dahlia Client several things during her phone visit yesterday and wanted to discuss something and wanted a return call. Called patient back. No answer, lvm asking patient to return the call.

## 2020-09-30 NOTE — Telephone Encounter (Signed)
Given COVID 19 results, verbalizes understanding. 

## 2020-10-04 ENCOUNTER — Other Ambulatory Visit: Payer: Self-pay

## 2020-10-04 ENCOUNTER — Other Ambulatory Visit: Payer: BC Managed Care – PPO

## 2020-10-04 DIAGNOSIS — Z20822 Contact with and (suspected) exposure to covid-19: Secondary | ICD-10-CM

## 2020-10-06 ENCOUNTER — Encounter: Payer: Self-pay | Admitting: Family Medicine

## 2020-10-06 ENCOUNTER — Other Ambulatory Visit: Payer: Self-pay

## 2020-10-06 ENCOUNTER — Encounter: Payer: Self-pay | Admitting: Internal Medicine

## 2020-10-06 ENCOUNTER — Telehealth (INDEPENDENT_AMBULATORY_CARE_PROVIDER_SITE_OTHER): Payer: BC Managed Care – PPO | Admitting: Internal Medicine

## 2020-10-06 ENCOUNTER — Telehealth: Payer: Self-pay

## 2020-10-06 DIAGNOSIS — U071 COVID-19: Secondary | ICD-10-CM | POA: Diagnosis not present

## 2020-10-06 DIAGNOSIS — Z7189 Other specified counseling: Secondary | ICD-10-CM | POA: Diagnosis not present

## 2020-10-06 DIAGNOSIS — R110 Nausea: Secondary | ICD-10-CM | POA: Diagnosis not present

## 2020-10-06 LAB — NOVEL CORONAVIRUS, NAA: SARS-CoV-2, NAA: DETECTED — AB

## 2020-10-06 LAB — SARS-COV-2, NAA 2 DAY TAT

## 2020-10-06 MED ORDER — BENZONATATE 100 MG PO CAPS: 100.0000 mg | ORAL_CAPSULE | Freq: Two times a day (BID) | ORAL | 0 refills | Status: DC | PRN

## 2020-10-06 MED ORDER — ONDANSETRON 4 MG PO TBDP: 4.0000 mg | ORAL_TABLET | Freq: Three times a day (TID) | ORAL | 0 refills | Status: DC | PRN

## 2020-10-06 NOTE — Telephone Encounter (Signed)
error 

## 2020-10-06 NOTE — Progress Notes (Addendum)
Virtual Visit via Telephone Note   This visit type was conducted due to national recommendations for restrictions regarding the COVID-19 Pandemic (e.g. social distancing) in an effort to limit this patient's exposure and mitigate transmission in our community.  Due to her co-morbid illnesses, this patient is at least at moderate risk for complications without adequate follow up.  This format is felt to be most appropriate for this patient at this time.  The patient did not have access to video technology/had technical difficulties with video requiring transitioning to audio format only (telephone).  All issues noted in this document were discussed and addressed.  No physical exam could be performed with this format.  Evaluation Performed:  Follow-up visit  Date:  10/06/2020   ID:  Tiffany Goodwin, DOB Mar 22, 1991, MRN [id number]  Participants: Patient Patient Location: Home Provider Location: Office/Clinic  Location of Patient: Home Location of Provider: Telehealth Consent was obtain for visit to be over via telehealth. I verified that I am speaking with the correct person using two identifiers.  PCP:  Freddy Finner, NP   Chief Complaint: COVID-19 positive, Nausea and headache  History of Present Illness:    Tiffany Goodwin is a 29 y.o. female who has a televisit for c/o nausea and headache and has been tested COVID positive.  Patient complains of symptoms of a URI. Symptoms include cough and sore throat. Onset of symptoms was 1 week ago, unchanged since that time. She also c/o cough described as productive of clear sputum for the past 1 week. Her son tested positive for COVID last week. She tested positive for COVID today. Of note, COVID test last week was negative.  Evaluation to date: Had a televisit and had COVID testing ordered twice, Promethazine-DM syrup and Flonase.  She has been having generalized headache, constant, dull, non-radiating and denies any numbness, tingling or  focal weakness. She also c/o nausea, but no vomiting and is not able to eat properly due to nausea and throat pain. She states that her sore throat is improving.    The patient does have symptoms concerning for COVID-19 infection (fever, chills, cough, or new shortness of breath).   Past Medical, Surgical, Social History, Allergies, and Medications have been Reviewed.  Past Medical History:  Diagnosis Date  . Abnormal Pap smear of cervix 04/03/2018   03/31/18: ASCUS/HPV   Colpo___  . Anxiety    past  . BV (bacterial vaginosis) 10/21/2014  . Itching in the vaginal area 12/07/2015  . LLQ pain 04/19/2015  . Missed period 04/19/2015  . Patient desires pregnancy 12/07/2015  . Pelvic pain in female 11/18/2014  . Pneumonia 2016, january; 12/2018   left side  . Screening for STD (sexually transmitted disease) 01/20/2015  . Spider vein of right lower extremity 09/04/2017  . Vaginal discharge 10/21/2014   Past Surgical History:  Procedure Laterality Date  . TONSILLECTOMY    . WISDOM TOOTH EXTRACTION    . WISDOM TOOTH EXTRACTION       Current Meds  Medication Sig  . busPIRone (BUSPAR) 10 MG tablet Take 1 tablet (10 mg total) by mouth 3 (three) times daily.  . fluticasone (FLONASE) 50 MCG/ACT nasal spray Place 2 sprays into both nostrils daily.  . Norethindrone Acetate-Ethinyl Estrad-FE (LOESTRIN 24 FE) 1-20 MG-MCG(24) tablet Take 1 tablet by mouth daily.  . promethazine-dextromethorphan (PROMETHAZINE-DM) 6.25-15 MG/5ML syrup Take 2.5 mLs by mouth 4 (four) times daily as needed for cough.     Allergies:  Patient has no known allergies.   ROS:   Please see the history of present illness.    Review of Systems  Constitutional: Positive for malaise/fatigue. Negative for chills and fever.  HENT: Positive for congestion and sore throat. Negative for ear discharge, ear pain and sinus pain.   Eyes: Negative for pain and redness.  Respiratory: Positive for cough and sputum production. Negative for  shortness of breath.   Cardiovascular: Negative for chest pain and palpitations.  Gastrointestinal: Positive for nausea. Negative for constipation, diarrhea and vomiting.  Genitourinary: Negative for dysuria and hematuria.  Musculoskeletal: Negative for neck pain.  Neurological: Positive for headaches. Negative for sensory change, focal weakness and loss of consciousness.     Labs/Other Tests and Data Reviewed:    Recent Labs: No results found for requested labs within last 8760 hours.   Recent Lipid Panel No results found for: CHOL, TRIG, HDL, CHOLHDL, LDLCALC, LDLDIRECT  Wt Readings from Last 3 Encounters:  09/29/20 170 lb (77.1 kg)  08/10/20 166 lb (75.3 kg)  07/20/20 166 lb (75.3 kg)     ASSESSMENT & PLAN:    COVID-19 infection Seld-quarantine advised for at least 10 days, or until afebrile for at least 24 hours, whichever is later Tylenol PRN for fever/myalgias Tessalon PRN for cough Zofran PRN for nausea Hand hygiene Increase fluid intake to stay hydrated, at least 2 l in a day Educated about usual course of the infection and when to visit ER for evaluation.  Nausea and headache Related to acute viral infection, Zofran and Tylenol PRN     Time:   Today, I have spent 22 minutes reviewing the chart, including problem list, medications, and with the patient with telehealth technology discussing the above problems.   Medication Adjustments/Labs and Tests Ordered: Current medicines are reviewed at length with the patient today.  Concerns regarding medicines are outlined above.   Tests Ordered: No orders of the defined types were placed in this encounter.   Medication Changes: Meds ordered this encounter  Medications  . ondansetron (ZOFRAN ODT) 4 MG disintegrating tablet    Sig: Take 1 tablet (4 mg total) by mouth every 8 (eight) hours as needed for nausea or vomiting.    Dispense:  20 tablet    Refill:  0  . benzonatate (TESSALON) 100 MG capsule    Sig:  Take 1 capsule (100 mg total) by mouth 2 (two) times daily as needed for cough.    Dispense:  30 capsule    Refill:  0     Note: This dictation was prepared with Dragon dictation along with smaller phrase technology. Similar sounding words can be transcribed inadequately or may not be corrected upon review. Any transcriptional errors that result from this process are unintentional.      Disposition:  Follow up  Signed, Anabel Halon, MD  10/06/2020 11:55 AM     [facility]

## 2020-10-06 NOTE — Telephone Encounter (Signed)
Pt made an appt virtually with Dr Allena Katz 10-06-20

## 2020-10-06 NOTE — Patient Instructions (Signed)
COVID-19 COVID-19 is a respiratory infection that is caused by a virus called severe acute respiratory syndrome coronavirus 2 (SARS-CoV-2). The disease is also known as coronavirus disease or novel coronavirus. In some people, the virus may not cause any symptoms. In others, it may cause a serious infection. The infection can get worse quickly and can lead to complications, such as:  Pneumonia, or infection of the lungs.  Acute respiratory distress syndrome or ARDS. This is a condition in which fluid build-up in the lungs prevents the lungs from filling with air and passing oxygen into the blood.  Acute respiratory failure. This is a condition in which there is not enough oxygen passing from the lungs to the body or when carbon dioxide is not passing from the lungs out of the body.  Sepsis or septic shock. This is a serious bodily reaction to an infection.  Blood clotting problems.  Secondary infections due to bacteria or fungus.  Organ failure. This is when your body's organs stop working. The virus that causes COVID-19 is contagious. This means that it can spread from person to person through droplets from coughs and sneezes (respiratory secretions). What are the causes? This illness is caused by a virus. You may catch the virus by:  Breathing in droplets from an infected person. Droplets can be spread by a person breathing, speaking, singing, coughing, or sneezing.  Touching something, like a table or a doorknob, that was exposed to the virus (contaminated) and then touching your mouth, nose, or eyes. What increases the risk? Risk for infection You are more likely to be infected with this virus if you:  Are within 6 feet (2 meters) of a person with COVID-19.  Provide care for or live with a person who is infected with COVID-19.  Spend time in crowded indoor spaces or live in shared housing. Risk for serious illness You are more likely to become seriously ill from the virus if you:   Are 50 years of age or older. The higher your age, the more you are at risk for serious illness.  Live in a nursing home or long-term care facility.  Have cancer.  Have a long-term (chronic) disease such as: ? Chronic lung disease, including chronic obstructive pulmonary disease or asthma. ? A long-term disease that lowers your body's ability to fight infection (immunocompromised). ? Heart disease, including heart failure, a condition in which the arteries that lead to the heart become narrow or blocked (coronary artery disease), a disease which makes the heart muscle thick, weak, or stiff (cardiomyopathy). ? Diabetes. ? Chronic kidney disease. ? Sickle cell disease, a condition in which red blood cells have an abnormal "sickle" shape. ? Liver disease.  Are obese. What are the signs or symptoms? Symptoms of this condition can range from mild to severe. Symptoms may appear any time from 2 to 14 days after being exposed to the virus. They include:  A fever or chills.  A cough.  Difficulty breathing.  Headaches, body aches, or muscle aches.  Runny or stuffy (congested) nose.  A sore throat.  New loss of taste or smell. Some people may also have stomach problems, such as nausea, vomiting, or diarrhea. Other people may not have any symptoms of COVID-19. How is this diagnosed? This condition may be diagnosed based on:  Your signs and symptoms, especially if: ? You live in an area with a COVID-19 outbreak. ? You recently traveled to or from an area where the virus is common. ? You   provide care for or live with a person who was diagnosed with COVID-19. ? You were exposed to a person who was diagnosed with COVID-19.  A physical exam.  Lab tests, which may include: ? Taking a sample of fluid from the back of your nose and throat (nasopharyngeal fluid), your nose, or your throat using a swab. ? A sample of mucus from your lungs (sputum). ? Blood tests.  Imaging tests, which  may include, X-rays, CT scan, or ultrasound. How is this treated? At present, there is no medicine to treat COVID-19. Medicines that treat other diseases are being used on a trial basis to see if they are effective against COVID-19. Your health care provider will talk with you about ways to treat your symptoms. For most people, the infection is mild and can be managed at home with rest, fluids, and over-the-counter medicines. Treatment for a serious infection usually takes places in a hospital intensive care unit (ICU). It may include one or more of the following treatments. These treatments are given until your symptoms improve.  Receiving fluids and medicines through an IV.  Supplemental oxygen. Extra oxygen is given through a tube in the nose, a face mask, or a hood.  Positioning you to lie on your stomach (prone position). This makes it easier for oxygen to get into the lungs.  Continuous positive airway pressure (CPAP) or bi-level positive airway pressure (BPAP) machine. This treatment uses mild air pressure to keep the airways open. A tube that is connected to a motor delivers oxygen to the body.  Ventilator. This treatment moves air into and out of the lungs by using a tube that is placed in your windpipe.  Tracheostomy. This is a procedure to create a hole in the neck so that a breathing tube can be inserted.  Extracorporeal membrane oxygenation (ECMO). This procedure gives the lungs a chance to recover by taking over the functions of the heart and lungs. It supplies oxygen to the body and removes carbon dioxide. Follow these instructions at home: Lifestyle  If you are sick, stay home except to get medical care. Your health care provider will tell you how long to stay home. Call your health care provider before you go for medical care.  Rest at home as told by your health care provider.  Do not use any products that contain nicotine or tobacco, such as cigarettes, e-cigarettes, and  chewing tobacco. If you need help quitting, ask your health care provider.  Return to your normal activities as told by your health care provider. Ask your health care provider what activities are safe for you. General instructions  Take over-the-counter and prescription medicines only as told by your health care provider.  Drink enough fluid to keep your urine pale yellow.  Keep all follow-up visits as told by your health care provider. This is important. How is this prevented?  There is no vaccine to help prevent COVID-19 infection. However, there are steps you can take to protect yourself and others from this virus. To protect yourself:   Do not travel to areas where COVID-19 is a risk. The areas where COVID-19 is reported change often. To identify high-risk areas and travel restrictions, check the CDC travel website: wwwnc.cdc.gov/travel/notices  If you live in, or must travel to, an area where COVID-19 is a risk, take precautions to avoid infection. ? Stay away from people who are sick. ? Wash your hands often with soap and water for 20 seconds. If soap and water   are not available, use an alcohol-based hand sanitizer. ? Avoid touching your mouth, face, eyes, or nose. ? Avoid going out in public, follow guidance from your state and local health authorities. ? If you must go out in public, wear a cloth face covering or face mask. Make sure your mask covers your nose and mouth. ? Avoid crowded indoor spaces. Stay at least 6 feet (2 meters) away from others. ? Disinfect objects and surfaces that are frequently touched every day. This may include:  Counters and tables.  Doorknobs and light switches.  Sinks and faucets.  Electronics, such as phones, remote controls, keyboards, computers, and tablets. To protect others: If you have symptoms of COVID-19, take steps to prevent the virus from spreading to others.  If you think you have a COVID-19 infection, contact your health care  provider right away. Tell your health care team that you think you may have a COVID-19 infection.  Stay home. Leave your house only to seek medical care. Do not use public transport.  Do not travel while you are sick.  Wash your hands often with soap and water for 20 seconds. If soap and water are not available, use alcohol-based hand sanitizer.  Stay away from other members of your household. Let healthy household members care for children and pets, if possible. If you have to care for children or pets, wash your hands often and wear a mask. If possible, stay in your own room, separate from others. Use a different bathroom.  Make sure that all people in your household wash their hands well and often.  Cough or sneeze into a tissue or your sleeve or elbow. Do not cough or sneeze into your hand or into the air.  Wear a cloth face covering or face mask. Make sure your mask covers your nose and mouth. Where to find more information  Centers for Disease Control and Prevention: www.cdc.gov/coronavirus/2019-ncov/index.html  World Health Organization: www.who.int/health-topics/coronavirus Contact a health care provider if:  You live in or have traveled to an area where COVID-19 is a risk and you have symptoms of the infection.  You have had contact with someone who has COVID-19 and you have symptoms of the infection. Get help right away if:  You have trouble breathing.  You have pain or pressure in your chest.  You have confusion.  You have bluish lips and fingernails.  You have difficulty waking from sleep.  You have symptoms that get worse. These symptoms may represent a serious problem that is an emergency. Do not wait to see if the symptoms will go away. Get medical help right away. Call your local emergency services (911 in the [place] Do not drive yourself to the hospital. Let the emergency medical personnel know if you think you have COVID-19. Summary  COVID-19 is a  respiratory infection that is caused by a virus. It is also known as coronavirus disease or novel coronavirus. It can cause serious infections, such as pneumonia, acute respiratory distress syndrome, acute respiratory failure, or sepsis.  The virus that causes COVID-19 is contagious. This means that it can spread from person to person through droplets from breathing, speaking, singing, coughing, or sneezing.  You are more likely to develop a serious illness if you are 50 years of age or older, have a weak immune system, live in a nursing home, or have chronic disease.  There is no medicine to treat COVID-19. Your health care provider will talk with you about ways to treat your symptoms.    Take steps to protect yourself and others from infection. Wash your hands often and disinfect objects and surfaces that are frequently touched every day. Stay away from people who are sick and wear a mask if you are sick. This information is not intended to replace advice given to you by your health care provider. Make sure you discuss any questions you have with your health care provider. Document Revised: 09/04/2019 Document Reviewed: 12/11/2018 Elsevier Patient Education  2020 Elsevier Inc.  

## 2020-10-07 ENCOUNTER — Emergency Department (HOSPITAL_COMMUNITY)
Admission: EM | Admit: 2020-10-07 | Discharge: 2020-10-07 | Disposition: A | Discharge status: Home / Self Care | Payer: BC Managed Care – PPO | Attending: Emergency Medicine | Admitting: Emergency Medicine

## 2020-10-07 ENCOUNTER — Other Ambulatory Visit: Payer: Self-pay

## 2020-10-07 ENCOUNTER — Telehealth (HOSPITAL_COMMUNITY): Payer: Self-pay

## 2020-10-07 ENCOUNTER — Telehealth: Payer: Self-pay | Admitting: Physician Assistant

## 2020-10-07 ENCOUNTER — Encounter (HOSPITAL_COMMUNITY): Payer: Self-pay

## 2020-10-07 ENCOUNTER — Encounter: Payer: Self-pay | Admitting: Family Medicine

## 2020-10-07 DIAGNOSIS — U071 COVID-19: Secondary | ICD-10-CM | POA: Diagnosis present

## 2020-10-07 MED ORDER — SODIUM CHLORIDE 0.9 % IV SOLN: INTRAVENOUS | Status: DC | PRN

## 2020-10-07 MED ORDER — DIPHENHYDRAMINE HCL 50 MG/ML IJ SOLN: 50.0000 mg | Freq: Once | INTRAMUSCULAR | Status: DC | PRN

## 2020-10-07 MED ORDER — SODIUM CHLORIDE 0.9 % IV SOLN
1200.0000 mg | Freq: Once | INTRAVENOUS | Status: AC
  Administered 2020-10-07: 1200 mg via INTRAVENOUS
  Filled 2020-10-07: qty 10

## 2020-10-07 MED ORDER — ALBUTEROL SULFATE HFA 108 (90 BASE) MCG/ACT IN AERS
1.0000 | INHALATION_SPRAY | Freq: Four times a day (QID) | RESPIRATORY_TRACT | 1 refills | Status: DC | PRN
Start: 2020-10-07 — End: 2021-02-22

## 2020-10-07 MED ORDER — EPINEPHRINE 0.3 MG/0.3ML IJ SOAJ: 0.3000 mg | Freq: Once | INTRAMUSCULAR | Status: DC | PRN

## 2020-10-07 MED ORDER — FAMOTIDINE IN NACL 20-0.9 MG/50ML-% IV SOLN: 20.0000 mg | Freq: Once | INTRAVENOUS | Status: DC | PRN

## 2020-10-07 MED ORDER — ALBUTEROL SULFATE HFA 108 (90 BASE) MCG/ACT IN AERS: 2.0000 | INHALATION_SPRAY | Freq: Once | RESPIRATORY_TRACT | Status: DC | PRN

## 2020-10-07 MED ORDER — METHYLPREDNISOLONE SODIUM SUCC 125 MG IJ SOLR: 125.0000 mg | Freq: Once | INTRAMUSCULAR | Status: DC | PRN

## 2020-10-07 NOTE — Discharge Instructions (Addendum)
Continue your medications.  Add on the albuterol inhaler 2 puffs every 6 hours.  Return for any worsening shortness of breath or feeling air hunger.

## 2020-10-07 NOTE — ED Triage Notes (Signed)
Pt presented to ED due to she tested positive yesterday and she is having body aches. C/O n/v/d and hasn't been able to eat anything.

## 2020-10-07 NOTE — ED Provider Notes (Signed)
[facility] EMERGENCY DEPARTMENT Provider Note   CSN: [id number] Arrival date & time: 10/07/20  1500     History Chief Complaint  Patient presents with  . Covid Positive    Tiffany Goodwin is a 29 y.o. female.  Patient with Covid symptoms onset Friday a week ago.  Patient with positive Covid test 2 days ago.  Patient symptoms have included mild headache congestion cough shortness of breath nausea difficulty taking p.o. fluids sore throat although improving and body aches.  Main complaint is low back pain and hip pain.  Patient's boyfriend is positive for Covid he was vaccinated.  Her 67-year-old child positive for Covid and doing well.  Patient not vaccinated.  Patient states she is got a history of asthma.        Past Medical History:  Diagnosis Date  . Abnormal Pap smear of cervix 04/03/2018   03/31/18: ASCUS/HPV   Colpo___  . Anxiety    past  . BV (bacterial vaginosis) 10/21/2014  . Itching in the vaginal area 12/07/2015  . LLQ pain 04/19/2015  . Missed period 04/19/2015  . Patient desires pregnancy 12/07/2015  . Pelvic pain in female 11/18/2014  . Pneumonia 2016, january; 12/2018   left side  . Screening for STD (sexually transmitted disease) 01/20/2015  . Spider vein of right lower extremity 09/04/2017  . Vaginal discharge 10/21/2014    Patient Active Problem List   Diagnosis Date Noted  . 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 Anxiety 03/16/2020    Past Surgical History:  Procedure Laterality Date  . TONSILLECTOMY    . WISDOM TOOTH EXTRACTION    . WISDOM TOOTH EXTRACTION       OB History    Gravida  2   Para  2   Term  2   Preterm  0   AB  0   Living  2     SAB  0   TAB  0   Ectopic  0   Multiple  0   Live Births  2           Family History  Problem Relation Age of Onset  . Heart attack Maternal Grandmother   . Stroke Maternal Grandmother   . Dementia Maternal Grandmother   . Other Paternal Grandfather         brain aneursym  . Cancer Maternal Grandfather        bone,chest,liver,brain,pancreatic,throat  . Anesthesia problems Neg Hx   . Hypotension Neg Hx   . Malignant hyperthermia Neg Hx   . Pseudochol deficiency Neg Hx     Social History   Tobacco Use  . Smoking status: Never Smoker  . Smokeless tobacco: Never Used  Vaping Use  . Vaping Use: Never used  Substance Use Topics  . Alcohol use: No  . Drug use: No    Home Medications Prior to Admission medications   Medication Sig Start Date End Date Taking? Authorizing Provider  albuterol (VENTOLIN HFA) 108 (90 Base) MCG/ACT inhaler Inhale 1-2 puffs into the lungs every 6 (six) hours as needed for wheezing or shortness of breath. 10/07/20   Vanetta Mulders, MD  benzonatate (TESSALON) 100 MG capsule Take 1 capsule (100 mg total) by mouth 2 (two) times daily as needed for cough. 10/06/20   Anabel Halon, MD  busPIRone (BUSPAR) 10 MG tablet Take 1 tablet (10 mg total) by mouth 3 (three) times daily. 09/15/20   Kerri Perches, MD  fluticasone Aleda Grana)  50 MCG/ACT nasal spray Place 2 sprays into both nostrils daily. 09/29/20   Freddy Finner, NP  Norethindrone Acetate-Ethinyl Estrad-FE (LOESTRIN 24 FE) 1-20 MG-MCG(24) tablet Take 1 tablet by mouth daily. 02/17/20   Fair, Hoyle Sauer, MD  ondansetron (ZOFRAN ODT) 4 MG disintegrating tablet Take 1 tablet (4 mg total) by mouth every 8 (eight) hours as needed for nausea or vomiting. 10/06/20   Anabel Halon, MD  promethazine-dextromethorphan (PROMETHAZINE-DM) 6.25-15 MG/5ML syrup Take 2.5 mLs by mouth 4 (four) times daily as needed for cough. 09/29/20   Freddy Finner, NP    Allergies    Patient has no known allergies.  Review of Systems   Review of Systems  Constitutional: Positive for appetite change. Negative for chills and fever.  HENT: Positive for congestion and sore throat. Negative for rhinorrhea.   Eyes: Negative for visual disturbance.  Respiratory: Positive for cough and  shortness of breath.   Cardiovascular: Negative for chest pain and leg swelling.  Gastrointestinal: Positive for nausea. Negative for abdominal pain, diarrhea and vomiting.  Genitourinary: Negative for dysuria.  Musculoskeletal: Positive for myalgias. Negative for back pain and neck pain.  Skin: Negative for rash.  Neurological: Positive for headaches. Negative for dizziness and light-headedness.  Hematological: Does not bruise/bleed easily.  Psychiatric/Behavioral: Negative for confusion.    Physical Exam Updated Vital Signs BP 114/79   Pulse 95   Temp 99.1 F (37.3 C) (Oral)   Resp 17   Ht 1.702 m (5\' 7" )   Wt 70.7 kg   SpO2 96%   BMI 24.40 kg/m   Physical Exam Vitals and nursing note reviewed.  Constitutional:      General: She is not in acute distress.    Appearance: Normal appearance. She is well-developed. She is not ill-appearing.  HENT:     Head: Normocephalic and atraumatic.     Mouth/Throat:     Mouth: Mucous membranes are dry.  Eyes:     Extraocular Movements: Extraocular movements intact.     Conjunctiva/sclera: Conjunctivae normal.     Pupils: Pupils are equal, round, and reactive to light.  Cardiovascular:     Rate and Rhythm: Normal rate and regular rhythm.     Heart sounds: No murmur heard.   Pulmonary:     Effort: Pulmonary effort is normal. No respiratory distress.     Breath sounds: Normal breath sounds. No wheezing.  Abdominal:     Palpations: Abdomen is soft.     Tenderness: There is no abdominal tenderness.  Musculoskeletal:        General: No swelling.     Cervical back: Normal range of motion and neck supple.  Skin:    General: Skin is warm and dry.  Neurological:     General: No focal deficit present.     Mental Status: She is alert and oriented to person, place, and time.     Cranial Nerves: No cranial nerve deficit.     Sensory: No sensory deficit.     ED Results / Procedures / Treatments   Labs (all labs ordered are listed, but  only abnormal results are displayed) Labs Reviewed - No data to display  EKG None  Radiology No results found.  Procedures Procedures (including critical care time)  Medications Ordered in ED Medications  casirivimab-imdevimab (REGEN-COV) 1,200 mg in sodium chloride 0.9 % 110 mL IVPB (1,200 mg Intravenous New Bag/Given 10/07/20 1741)  0.9 %  sodium chloride infusion (has no administration in time range)  diphenhydrAMINE (BENADRYL) injection 50 mg (has no administration in time range)  famotidine (PEPCID) IVPB 20 mg premix (has no administration in time range)  methylPREDNISolone sodium succinate (SOLU-MEDROL) 125 mg/2 mL injection 125 mg (has no administration in time range)  albuterol (VENTOLIN HFA) 108 (90 Base) MCG/ACT inhaler 2 puff (has no administration in time range)  EPINEPHrine (EPI-PEN) injection 0.3 mg (has no administration in time range)    ED Course  I have reviewed the triage vital signs and the nursing notes.  Pertinent labs & imaging results that were available during my care of the patient were reviewed by me and considered in my medical decision making (see chart for details).    MDM Rules/Calculators/A&P                             Patient on day 8 of Covid symptoms.  Positive Covid test 2 days ago.  Patient was not referred by primary care provider for monoclonal antibody consideration.  Patient BMI is listed at 24.4.  Patient has a history of asthma.  BMI just slightly below 25.  But with a history of asthma we will go ahead and give on a cleanout body here.  Because patient is already on day 8 tomorrow will be day 9.  Patient no hypoxia patient not in any acute distress.  Heart rate in the 90s.  Oxygen sats 95%.  Lungs clear today.  No wheezing.  Patient will be discharged home with albuterol inhaler  Patient tolerated the monoclonal antibody infusion without any complications.  Patient stable for discharge home.  Final Clinical Impression(s) / ED  Diagnoses Final diagnoses:  COVID    Rx / DC Orders ED Discharge Orders         Ordered    albuterol (VENTOLIN HFA) 108 (90 Base) MCG/ACT inhaler  Every 6 hours PRN        10/07/20 1742           Vanetta Mulders, MD 10/07/20 1928

## 2020-10-07 NOTE — Telephone Encounter (Signed)
Called to Discuss with patient about Covid symptoms and the use of the monoclonal antibody infusion for those with mild to moderate Covid symptoms and at a high risk of hospitalization.     Pt appears to qualify for this infusion due to co-morbid conditions and/or a member of an at-risk group in accordance with the FDA Emergency Use Authorization.    Unable to reach pt. Left voice mail to call back.   

## 2020-10-07 NOTE — Telephone Encounter (Signed)
Called to discuss with patient about Covid symptoms and the use of the monoclonal antibody infusion for those with mild to moderate Covid symptoms and at a high risk of hospitalization.     Pt appears to qualify for this infusion due to co-morbid conditions and/or a member of an at-risk group in accordance with the FDA Emergency Use Authorization.   Risk factors include: BMI >25  Symptom onset: 09/30/20  Tested positive for COVID 19: 10/04/20 at [facility] Testing site, [place]  Discussed information regarding costs of monoclonal antibody treatment, given both CPT & REV codes, and encouraged patient to call their health insurance company to verify cost of treatment that pt will be financially responsible for.  Pre-screened by RN and ready for APP to call and further discuss and/or schedule pt for antibody infusion appt.

## 2020-10-14 ENCOUNTER — Encounter: Payer: Self-pay | Admitting: Family Medicine

## 2020-11-09 ENCOUNTER — Telehealth: Payer: Self-pay | Admitting: Family Medicine

## 2020-11-09 NOTE — Telephone Encounter (Signed)
FMLA received COPIED NOTED SLEEVED

## 2020-11-24 ENCOUNTER — Encounter: Payer: Self-pay | Admitting: Family Medicine

## 2020-11-28 DIAGNOSIS — Z8489 Family history of other specified conditions: Secondary | ICD-10-CM

## 2020-12-29 ENCOUNTER — Other Ambulatory Visit: Payer: Self-pay | Admitting: Family Medicine

## 2020-12-29 DIAGNOSIS — F419 Anxiety disorder, unspecified: Secondary | ICD-10-CM

## 2021-02-01 ENCOUNTER — Other Ambulatory Visit: Payer: Self-pay | Admitting: *Deleted

## 2021-02-01 DIAGNOSIS — Z01419 Encounter for gynecological examination (general) (routine) without abnormal findings: Secondary | ICD-10-CM

## 2021-02-01 MED ORDER — NORETHIN ACE-ETH ESTRAD-FE 1-20 MG-MCG(24) PO TABS: 1.0000 | ORAL_TABLET | Freq: Every day | ORAL | 11 refills | Status: DC

## 2021-02-22 ENCOUNTER — Other Ambulatory Visit: Payer: Self-pay

## 2021-02-22 ENCOUNTER — Encounter: Payer: Self-pay | Admitting: Adult Health

## 2021-02-22 ENCOUNTER — Other Ambulatory Visit (HOSPITAL_COMMUNITY)
Admission: RE | Admit: 2021-02-22 | Discharge: 2021-02-22 | Disposition: A | Discharge status: Home / Self Care | Payer: BC Managed Care – PPO | Source: Ambulatory Visit | Attending: Adult Health | Admitting: Adult Health

## 2021-02-22 ENCOUNTER — Ambulatory Visit (INDEPENDENT_AMBULATORY_CARE_PROVIDER_SITE_OTHER): Payer: BC Managed Care – PPO | Admitting: Adult Health

## 2021-02-22 VITALS — BP 125/72 | HR 80 | Ht 67.0 in | Wt 175.0 lb

## 2021-02-22 DIAGNOSIS — Z01419 Encounter for gynecological examination (general) (routine) without abnormal findings: Secondary | ICD-10-CM | POA: Insufficient documentation

## 2021-02-22 DIAGNOSIS — Z0001 Encounter for general adult medical examination with abnormal findings: Secondary | ICD-10-CM | POA: Insufficient documentation

## 2021-02-22 DIAGNOSIS — Z3041 Encounter for surveillance of contraceptive pills: Secondary | ICD-10-CM | POA: Diagnosis not present

## 2021-02-22 DIAGNOSIS — Z8742 Personal history of other diseases of the female genital tract: Secondary | ICD-10-CM | POA: Diagnosis not present

## 2021-02-22 DIAGNOSIS — F419 Anxiety disorder, unspecified: Secondary | ICD-10-CM | POA: Diagnosis not present

## 2021-02-22 MED ORDER — NORETHIN ACE-ETH ESTRAD-FE 1-20 MG-MCG(24) PO TABS: 1.0000 | ORAL_TABLET | Freq: Every day | ORAL | 11 refills | Status: DC

## 2021-02-22 NOTE — Progress Notes (Signed)
Patient ID: Tiffany Goodwin, female   DOB: 1991/09/01, 30 y.o.   MRN: [id number] History of Present Illness: Tiffany Goodwin is a 30 year old white female,single, G2P2002, in for a well woman gyn exam and pap. Her pap 02/17/20 was ASCUS with +HPV and had colpo with no visible dysplasia. She gets anxious with exams. PCP is Tereasa Coop NP.  Current Medications, Allergies, Past Medical History, Past Surgical History, Family History and Social History were reviewed in [employer] record.     Review of Systems:  Patient denies any headaches, hearing loss, fatigue, blurred vision, shortness of breath, chest pain, abdominal pain, problems with bowel movements, urination, or intercourse. No joint pain or mood swings.   Physical Exam:BP 125/72 (BP Location: Right Arm, Patient Position: Sitting, Cuff Size: Normal)   Pulse 80   Ht 5\' 7"  (1.702 m)   Wt 175 lb (79.4 kg)   LMP 02/08/2021   BMI 27.41 kg/m  General:  Well developed, well nourished, no acute distress Skin:  Warm and dry Neck:  Midline trachea, normal thyroid, good ROM, no lymphadenopathy Lungs; Clear to auscultation bilaterally Breast:  No dominant palpable mass, retraction, or nipple discharge Cardiovascular: Regular rate and rhythm Abdomen:  Soft, non tender, no hepatosplenomegaly Pelvic:  External genitalia is normal in appearance, no lesions.  The vagina is normal in appearance. Urethra has no lesions or masses. The cervix is bulbous,everted at the os, pap with GC/CHL and HR HPV genotyping performed.  Uterus is felt to be normal size, shape, and contour.  No adnexal masses or tenderness noted.Bladder is non tender, no masses felt. Extremities/musculoskeletal:  No swelling or varicosities noted, no clubbing or cyanosis Psych:  No mood changes, alert and cooperative,seems happy AA is 0 Fall risk is low PHQ 9 score is 3 GAD 7 score is 3, on Buspar from PCP  [facility] - 02/22/21 0943      Pregnancy Intention Screening    Does the patient want to become pregnant in the next year? No    Does the patient's partner want to become pregnant in the next year? No    Would the patient like to discuss contraceptive options today? No      Contraception Wrap Up   Current Method Oral Contraceptive    End Method Oral Contraceptive    Contraception Counseling Provided No         Examination chaperoned by 04/24/21 felt faint on sitting up, was hot and sweaty with elevated HR, given water and elevated feet and she was fine in a few minutes.  Impression and Plan: 1. Encounter for gynecological examination with Papanicolaou smear of cervix Pap sent Physical in 1 year Pap in 3 if normal Mammogram at 40  2. History of abnormal cervical Pap smear Pap sent  3. Anxiety On Buspar, try to take daily Follow up with PCP  4. Encounter for surveillance of contraceptive pills Will refill [facility] Meds ordered this encounter  Medications  . Norethindrone Acetate-Ethinyl Estrad-FE (LOESTRIN 24 FE) 1-20 MG-MCG(24) tablet    Sig: Take 1 tablet by mouth daily.    Dispense:  28 tablet    Refill:  11    Order Specific Question:   Supervising Provider    Answer:   [facility] [[id number]]

## 2021-02-24 LAB — CYTOLOGY - PAP
Chlamydia: NEGATIVE
Comment: NEGATIVE
Comment: NEGATIVE
Comment: NORMAL
Diagnosis: NEGATIVE
Diagnosis: REACTIVE
High risk HPV: NEGATIVE
Neisseria Gonorrhea: NEGATIVE

## 2021-03-01 ENCOUNTER — Telehealth: Payer: BC Managed Care – PPO | Admitting: Nurse Practitioner

## 2021-03-07 ENCOUNTER — Other Ambulatory Visit: Payer: Self-pay | Admitting: Family Medicine

## 2021-03-07 DIAGNOSIS — F419 Anxiety disorder, unspecified: Secondary | ICD-10-CM

## 2021-06-05 ENCOUNTER — Ambulatory Visit: Payer: BC Managed Care – PPO | Attending: Internal Medicine

## 2021-06-05 DIAGNOSIS — Z20822 Contact with and (suspected) exposure to covid-19: Secondary | ICD-10-CM

## 2021-06-06 ENCOUNTER — Encounter: Payer: Self-pay | Admitting: Nurse Practitioner

## 2021-06-06 ENCOUNTER — Telehealth (INDEPENDENT_AMBULATORY_CARE_PROVIDER_SITE_OTHER): Payer: BC Managed Care – PPO | Admitting: Nurse Practitioner

## 2021-06-06 ENCOUNTER — Other Ambulatory Visit: Payer: Self-pay

## 2021-06-06 VITALS — Ht 67.0 in | Wt 180.0 lb

## 2021-06-06 DIAGNOSIS — Z0001 Encounter for general adult medical examination with abnormal findings: Secondary | ICD-10-CM

## 2021-06-06 DIAGNOSIS — U071 COVID-19: Secondary | ICD-10-CM

## 2021-06-06 DIAGNOSIS — Z0000 Encounter for general adult medical examination without abnormal findings: Secondary | ICD-10-CM

## 2021-06-06 LAB — NOVEL CORONAVIRUS, NAA: SARS-CoV-2, NAA: DETECTED — AB

## 2021-06-06 LAB — SARS-COV-2, NAA 2 DAY TAT

## 2021-06-06 MED ORDER — NIRMATRELVIR/RITONAVIR (PAXLOVID)TABLET: 3.0000 | ORAL_TABLET | Freq: Two times a day (BID) | ORAL | 0 refills | Status: AC

## 2021-06-06 MED ORDER — IBUPROFEN 600 MG PO TABS: 600.0000 mg | ORAL_TABLET | Freq: Three times a day (TID) | ORAL | 0 refills | Status: DC | PRN

## 2021-06-06 MED ORDER — NOREL AD 4-10-325 MG PO TABS: 1.0000 | ORAL_TABLET | ORAL | 1 refills | Status: DC | PRN

## 2021-06-06 NOTE — Assessment & Plan Note (Signed)
-  son tested positive, and now she has developed symptoms

## 2021-06-06 NOTE — Patient Instructions (Addendum)
The paxlovid may decrease the efficacy of your birth control, so you should use an additional layer of birth control (like condoms) while on this medicine.  You have not had a physical in a while. Let's meet up for one of those. Please have fasting labs drawn 2-3 days prior to your appointment so we can discuss the results during your office visit.

## 2021-06-06 NOTE — Progress Notes (Signed)
Acute Office Visit  Subjective:    Patient ID: Tiffany Goodwin, female    DOB: [birth year], 30 y.o.   MRN: [id number]  Chief Complaint  Patient presents with   Covid Exposure    Son tested positive Sunday. Her symptoms started Monday. Sinus drainage and body aches. Worse at night. Was swabbed yesterday in [place] on [address], still awaiting results.     HPI Patient is in today for sick visit. Positive exposure from her son.  Past Medical History:  Diagnosis Date   Abnormal Pap smear of cervix 04/03/2018   03/31/18: ASCUS/HPV   Colpo___   Anxiety    past   BV (bacterial vaginosis) 10/21/2014   Itching in the vaginal area 12/07/2015   LLQ pain 04/19/2015   Missed period 04/19/2015   Patient desires pregnancy 12/07/2015   Pelvic pain in female 11/18/2014   Pneumonia 2016, january; 12/2018   left side   Screening for STD (sexually transmitted disease) 01/20/2015   Spider vein of right lower extremity 09/04/2017   Vaginal discharge 10/21/2014    Past Surgical History:  Procedure Laterality Date   TONSILLECTOMY     WISDOM TOOTH EXTRACTION     WISDOM TOOTH EXTRACTION      Family History  Problem Relation Age of Onset   Heart attack Maternal Grandmother    Stroke Maternal Grandmother    Dementia Maternal Grandmother    Other Paternal Grandfather        brain aneursym   Cancer Maternal Grandfather        bone,chest,liver,brain,pancreatic,throat   Anesthesia problems Neg Hx    Hypotension Neg Hx    Malignant hyperthermia Neg Hx    Pseudochol deficiency Neg Hx     Social History   Socioeconomic History   Marital status: Single    Spouse name: Not on file   Number of children: Not on file   Years of education: Not on file   Highest education level: Not on file  Occupational History   Not on file  Tobacco Use   Smoking status: Never   Smokeless tobacco: Never  Vaping Use   Vaping Use: Never used  Substance and Sexual Activity   Alcohol use: No   Drug use: No    Sexual activity: Yes    Birth control/protection: Pill  Other Topics Concern   Not on file  Social History Narrative   Lives with Hendricks Milo and her two children      [name]- 67 years old (2020) birthday in [birth year]   [name]-1 years (2020) December 1st      Eats: veggies, meats, sweets, processed foods   Drink: Does not drink a lot of water. Sodas, OJ     Caffeine: 2-3 sodas      Wears sunscreen and seat belts.   Both smoke and carbon detectors in the home-checks batteries frequently.    Social Determinants of Health   Financial Resource Strain: Low Risk    Difficulty of Paying Living Expenses: Not hard at all  Food Insecurity: No Food Insecurity   Worried About Charity fundraiser in the Last Year: Never true   [place] in the Last Year: Never true  Transportation Needs: No Transportation Needs   Lack of Transportation (Medical): No   Lack of Transportation (Non-Medical): No  Physical Activity: Insufficiently Active   Days of Exercise per Week: 3 days   Minutes of Exercise per Session: 30 min  Stress: No Stress Concern  Present   Feeling of Stress : Only a little  Social Connections: Socially Isolated   Frequency of Communication with Friends and Family: Three times a week   Frequency of Social Gatherings with Friends and Family: Once a week   Attends Religious Services: Never   Marine scientist or Organizations: No   Attends Music therapist: Never   Marital Status: Never married  Human resources officer Violence: Not At Risk   Fear of Current or Ex-Partner: No   Emotionally Abused: No   Physically Abused: No   Sexually Abused: No    Outpatient Medications Prior to Visit  Medication Sig Dispense Refill   busPIRone (BUSPAR) 10 MG tablet TAKE 1 TABLET BY MOUTH THREE TIMES DAILY 90 tablet 0   Norethindrone Acetate-Ethinyl Estrad-FE (LOESTRIN 24 FE) 1-20 MG-MCG(24) tablet Take 1 tablet by mouth daily. 28 tablet 11   No facility-administered medications  prior to visit.    No Known Allergies  Review of Systems  Constitutional:  Positive for chills, fatigue and fever.  HENT:  Positive for congestion, sinus pressure and sinus pain.   Respiratory:  Negative for cough, shortness of breath and wheezing.   Cardiovascular: Negative.   Musculoskeletal:  Positive for myalgias.      Objective:    Physical Exam  Ht '5\' 7"'  (1.702 m)   Wt 180 lb (81.6 kg)   LMP 05/23/2021 (Approximate)   BMI 28.19 kg/m  Wt Readings from Last 3 Encounters:  06/06/21 180 lb (81.6 kg)  02/22/21 175 lb (79.4 kg)  10/07/20 155 lb 12.8 oz (70.7 kg)    There are no preventive care reminders to display for this patient.  There are no preventive care reminders to display for this patient.   No results found for: TSH Lab Results  Component Value Date   WBC 15.1 (H) 11/28/2018   HGB 14.0 11/28/2018   HCT 43.3 11/28/2018   MCV 90.6 11/28/2018   PLT 321 11/28/2018   Lab Results  Component Value Date   NA 139 11/28/2018   K 3.6 11/28/2018   CO2 20 (L) 11/28/2018   GLUCOSE 122 (H) 11/28/2018   BUN 18 11/28/2018   CREATININE 0.55 11/28/2018   BILITOT 0.8 11/28/2018   ALKPHOS 58 11/28/2018   AST 22 11/28/2018   Goodwin 25 11/28/2018   PROT 7.7 11/28/2018   ALBUMIN 4.1 11/28/2018   CALCIUM 8.7 (L) 11/28/2018   ANIONGAP 10 11/28/2018   No results found for: CHOL No results found for: HDL No results found for: LDLCALC No results found for: TRIG No results found for: CHOLHDL No results found for: HGBA1C     Assessment & Plan:   Problem List Items Addressed This Visit       Other   COVID-19 - Primary    -son tested positive, and now she has developed symptoms       Relevant Medications   nirmatrelvir/ritonavir EUA (PAXLOVID) TABS   Chlorphen-PE-Acetaminophen (NOREL AD) 4-10-325 MG TABS   ibuprofen (ADVIL) 600 MG tablet   Other Visit Diagnoses     Routine general medical examination at a health care facility       Relevant Orders   CBC  with Differential/Platelet   CMP14+EGFR   Lipid Panel With LDL/HDL Ratio   TSH        Meds ordered this encounter  Medications   nirmatrelvir/ritonavir EUA (PAXLOVID) TABS    Sig: Take 3 tablets by mouth 2 (two) times daily for 5 days. (  Take nirmatrelvir 150 mg two tablets twice daily for 5 days and ritonavir 100 mg one tablet twice daily for 5 days) Patient GFR is >60.    Dispense:  30 tablet    Refill:  0   Chlorphen-PE-Acetaminophen (NOREL AD) 4-10-325 MG TABS    Sig: Take 1 tablet by mouth every 4 (four) hours as needed (nasal congestion, cold symptoms).    Dispense:  20 tablet    Refill:  1   ibuprofen (ADVIL) 600 MG tablet    Sig: Take 1 tablet (600 mg total) by mouth every 8 (eight) hours as needed for headache, mild pain or moderate pain.    Dispense:  30 tablet    Refill:  0    Date:  06/06/2021   Location of Patient: Home Location of Provider: Office Consent was obtain for visit to be over via telehealth. I verified that I am speaking with the correct person using two identifiers.  I connected with  Tiffany Goodwin on 06/06/21 via telephone and verified that I am speaking with the correct person using two identifiers.   I discussed the limitations of evaluation and management by telemedicine. The patient expressed understanding and agreed to proceed.  Time spent: 4 minutes   Noreene Larsson, NP

## 2021-06-07 ENCOUNTER — Telehealth: Payer: BC Managed Care – PPO | Admitting: Nurse Practitioner

## 2021-06-19 ENCOUNTER — Other Ambulatory Visit: Payer: Self-pay | Admitting: Family Medicine

## 2021-06-19 DIAGNOSIS — F419 Anxiety disorder, unspecified: Secondary | ICD-10-CM

## 2021-06-29 LAB — CBC WITH DIFFERENTIAL/PLATELET
Basophils Absolute: 0 10*3/uL (ref 0.0–0.2)
Basos: 1 %
EOS (ABSOLUTE): 0.1 10*3/uL (ref 0.0–0.4)
Eos: 2 %
Hematocrit: 40.2 % (ref 34.0–46.6)
Hemoglobin: 13.4 g/dL (ref 11.1–15.9)
Immature Grans (Abs): 0 10*3/uL (ref 0.0–0.1)
Immature Granulocytes: 0 %
Lymphocytes Absolute: 2.2 10*3/uL (ref 0.7–3.1)
Lymphs: 31 %
MCH: 30.1 pg (ref 26.6–33.0)
MCHC: 33.3 g/dL (ref 31.5–35.7)
MCV: 90 fL (ref 79–97)
Monocytes Absolute: 0.4 10*3/uL (ref 0.1–0.9)
Monocytes: 6 %
Neutrophils Absolute: 4.2 10*3/uL (ref 1.4–7.0)
Neutrophils: 60 %
Platelets: 403 10*3/uL (ref 150–450)
RBC: 4.45 x10E6/uL (ref 3.77–5.28)
RDW: 11.9 % (ref 11.7–15.4)
WBC: 7 10*3/uL (ref 3.4–10.8)

## 2021-06-29 LAB — LIPID PANEL WITH LDL/HDL RATIO
Cholesterol, Total: 202 mg/dL — ABNORMAL HIGH (ref 100–199)
HDL: 53 mg/dL (ref >39)
LDL Chol Calc (NIH): 138 mg/dL — ABNORMAL HIGH (ref 0–99)
LDL/HDL Ratio: 2.6 ratio (ref 0.0–3.2)
Triglycerides: 60 mg/dL (ref 0–149)
VLDL Cholesterol Cal: 11 mg/dL (ref 5–40)

## 2021-06-29 LAB — CMP14+EGFR
ALT: 20 IU/L (ref 0–32)
AST: 17 IU/L (ref 0–40)
Albumin/Globulin Ratio: 1.5 (ref 1.2–2.2)
Albumin: 4.2 g/dL (ref 3.9–5.0)
Alkaline Phosphatase: 66 IU/L (ref 44–121)
BUN/Creatinine Ratio: 15 (ref 9–23)
BUN: 10 mg/dL (ref 6–20)
Bilirubin Total: 0.5 mg/dL (ref 0.0–1.2)
CO2: 21 mmol/L (ref 20–29)
Calcium: 9.7 mg/dL (ref 8.7–10.2)
Chloride: 104 mmol/L (ref 96–106)
Creatinine, Ser: 0.66 mg/dL (ref 0.57–1.00)
Globulin, Total: 2.8 g/dL (ref 1.5–4.5)
Glucose: 89 mg/dL (ref 65–99)
Potassium: 4.5 mmol/L (ref 3.5–5.2)
Sodium: 139 mmol/L (ref 134–144)
Total Protein: 7 g/dL (ref 6.0–8.5)
eGFR: 121 mL/min/{1.73_m2} (ref >59)

## 2021-06-29 LAB — TSH: TSH: 0.883 u[IU]/mL (ref 0.450–4.500)

## 2021-06-29 NOTE — Progress Notes (Signed)
LDL is elevated. Cut back on fried/fatty foods. We will talk about medicines at our appt on 8/17.

## 2021-07-05 ENCOUNTER — Ambulatory Visit (INDEPENDENT_AMBULATORY_CARE_PROVIDER_SITE_OTHER): Payer: BC Managed Care – PPO | Admitting: Nurse Practitioner

## 2021-07-05 ENCOUNTER — Other Ambulatory Visit: Payer: Self-pay

## 2021-07-05 ENCOUNTER — Encounter: Payer: Self-pay | Admitting: Nurse Practitioner

## 2021-07-05 VITALS — BP 128/79 | HR 78 | Temp 98.1°F | Ht 67.0 in | Wt 172.0 lb

## 2021-07-05 DIAGNOSIS — E7800 Pure hypercholesterolemia, unspecified: Secondary | ICD-10-CM | POA: Diagnosis not present

## 2021-07-05 DIAGNOSIS — E785 Hyperlipidemia, unspecified: Secondary | ICD-10-CM | POA: Insufficient documentation

## 2021-07-05 DIAGNOSIS — F410 Panic disorder [episodic paroxysmal anxiety] without agoraphobia: Secondary | ICD-10-CM | POA: Diagnosis not present

## 2021-07-05 DIAGNOSIS — Z0001 Encounter for general adult medical examination with abnormal findings: Secondary | ICD-10-CM | POA: Diagnosis not present

## 2021-07-05 DIAGNOSIS — E049 Nontoxic goiter, unspecified: Secondary | ICD-10-CM

## 2021-07-05 MED ORDER — HYDROXYZINE PAMOATE 25 MG PO CAPS: 25.0000 mg | ORAL_CAPSULE | Freq: Three times a day (TID) | ORAL | 0 refills | Status: DC | PRN

## 2021-07-05 MED ORDER — ATORVASTATIN CALCIUM 20 MG PO TABS: 20.0000 mg | ORAL_TABLET | Freq: Every day | ORAL | 3 refills | Status: DC

## 2021-07-05 NOTE — Assessment & Plan Note (Signed)
Lab Results  Component Value Date   CHOL 202 (H) 06/28/2021   HDL 53 06/28/2021   LDLCALC 138 (H) 06/28/2021   TRIG 60 06/28/2021   -Rx. Atorvastatin; no risk of being or becoming pregnant

## 2021-07-05 NOTE — Assessment & Plan Note (Signed)
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 hydroxyzine

## 2021-07-05 NOTE — Progress Notes (Signed)
Established Patient Office Visit  Subjective:  Patient ID: Tiffany Goodwin, female    DOB: January 14, 1991  Age: 30 y.o. MRN: [id number]  CC:  Chief Complaint  Patient presents with   Annual Exam    CPE    HPI Tiffany Goodwin presents for physical exam.  Past Medical History:  Diagnosis Date   Abnormal Pap smear of cervix 04/03/2018   03/31/18: ASCUS/HPV   Colpo___   Anxiety    past   BV (bacterial vaginosis) 10/21/2014   Itching in the vaginal area 12/07/2015   LLQ pain 04/19/2015   Missed period 04/19/2015   Patient desires pregnancy 12/07/2015   Pelvic pain in female 11/18/2014   Pneumonia 2016, january; 12/2018   left side   Screening for STD (sexually transmitted disease) 01/20/2015   Spider vein of right lower extremity 09/04/2017   Vaginal discharge 10/21/2014    Past Surgical History:  Procedure Laterality Date   TONSILLECTOMY     WISDOM TOOTH EXTRACTION     WISDOM TOOTH EXTRACTION      Family History  Problem Relation Age of Onset   Heart attack Maternal Grandmother    Stroke Maternal Grandmother    Dementia Maternal Grandmother    Other Paternal Grandfather        brain aneursym   Cancer Maternal Grandfather        bone,chest,liver,brain,pancreatic,throat   Anesthesia problems Neg Hx    Hypotension Neg Hx    Malignant hyperthermia Neg Hx    Pseudochol deficiency Neg Hx     Social History   Socioeconomic History   Marital status: Single    Spouse name: Not on file   Number of children: Not on file   Years of education: Not on file   Highest education level: Not on file  Occupational History   Not on file  Tobacco Use   Smoking status: Never   Smokeless tobacco: Never  Vaping Use   Vaping Use: Never used  Substance and Sexual Activity   Alcohol use: No   Drug use: No   Sexual activity: Yes    Birth control/protection: Pill  Other Topics Concern   Not on file  Social History Narrative   Lives with Hendricks Milo and her two children      [name]- 53 years old  (2020) birthday in [birth year]   [name]-1 years (2020) December 1st      Eats: veggies, meats, sweets, processed foods   Drink: Does not drink a lot of water. Sodas, OJ     Caffeine: 2-3 sodas      Wears sunscreen and seat belts.   Both smoke and carbon detectors in the home-checks batteries frequently.    Social Determinants of Health   Financial Resource Strain: Low Risk    Difficulty of Paying Living Expenses: Not hard at all  Food Insecurity: No Food Insecurity   Worried About Charity fundraiser in the Last Year: Never true   [place] in the Last Year: Never true  Transportation Needs: No Transportation Needs   Lack of Transportation (Medical): No   Lack of Transportation (Non-Medical): No  Physical Activity: Insufficiently Active   Days of Exercise per Week: 3 days   Minutes of Exercise per Session: 30 min  Stress: No Stress Concern Present   Feeling of Stress : Only a little  Social Connections: Socially Isolated   Frequency of Communication with Friends and Family: Three times a week   Frequency  of Social Gatherings with Friends and Family: Once a week   Attends Religious Services: Never   Marine scientist or Organizations: No   Attends Music therapist: Never   Marital Status: Never married  Human resources officer Violence: Not At Risk   Fear of Current or Ex-Partner: No   Emotionally Abused: No   Physically Abused: No   Sexually Abused: No    Outpatient Medications Prior to Visit  Medication Sig Dispense Refill   busPIRone (BUSPAR) 10 MG tablet TAKE 1 TABLET BY MOUTH THREE TIMES DAILY 90 tablet 0   ibuprofen (ADVIL) 600 MG tablet Take 1 tablet (600 mg total) by mouth every 8 (eight) hours as needed for headache, mild pain or moderate pain. 30 tablet 0   Norethindrone Acetate-Ethinyl Estrad-FE (LOESTRIN 24 FE) 1-20 MG-MCG(24) tablet Take 1 tablet by mouth daily. 28 tablet 11   Chlorphen-PE-Acetaminophen (NOREL AD) 4-10-325 MG TABS Take 1 tablet  by mouth every 4 (four) hours as needed (nasal congestion, cold symptoms). (Patient not taking: Reported on 07/05/2021) 20 tablet 1   No facility-administered medications prior to visit.    No Known Allergies  ROS Review of Systems  Constitutional: Negative.   HENT: Negative.    Eyes: Negative.   Respiratory: Negative.    Cardiovascular: Negative.   Gastrointestinal: Negative.   Endocrine: Negative.   Genitourinary: Negative.   Musculoskeletal: Negative.   Skin: Negative.   Allergic/Immunologic: Negative.   Neurological: Negative.   Hematological: Negative.   Psychiatric/Behavioral:  Negative for dysphoric mood, self-injury and suicidal ideas. The patient is nervous/anxious.      Objective:    Physical Exam Constitutional:      Appearance: Normal appearance.  HENT:     Head: Normocephalic and atraumatic.     Right Ear: Tympanic membrane, ear canal and external ear normal.     Left Ear: Tympanic membrane, ear canal and external ear normal.     Nose: Nose normal.     Mouth/Throat:     Mouth: Mucous membranes are moist.     Pharynx: Oropharynx is clear.  Eyes:     Extraocular Movements: Extraocular movements intact.     Conjunctiva/sclera: Conjunctivae normal.     Pupils: Pupils are equal, round, and reactive to light.  Neck:     Comments: Enlarged thyroid- symmetrical, no discreet nodules palpated Cardiovascular:     Rate and Rhythm: Normal rate and regular rhythm.     Pulses: Normal pulses.     Heart sounds: Normal heart sounds.  Pulmonary:     Effort: Pulmonary effort is normal.     Breath sounds: Normal breath sounds.  Abdominal:     General: Abdomen is flat. Bowel sounds are normal.     Palpations: Abdomen is soft.  Musculoskeletal:        General: Normal range of motion.     Cervical back: Normal range of motion and neck supple.  Skin:    General: Skin is warm and dry.     Capillary Refill: Capillary refill takes less than 2 seconds.  Neurological:      General: No focal deficit present.     Mental Status: She is alert and oriented to person, place, and time.     Cranial Nerves: No cranial nerve deficit.     Sensory: No sensory deficit.     Motor: No weakness.     Coordination: Coordination normal.     Gait: Gait normal.  Psychiatric:  Mood and Affect: Mood normal.        Behavior: Behavior normal.        Thought Content: Thought content normal.        Judgment: Judgment normal.    BP 128/79 (BP Location: Left Arm, Patient Position: Sitting, Cuff Size: Large)   Pulse 78   Temp 98.1 F (36.7 C) (Oral)   Ht '5\' 7"'  (1.702 m)   Wt 172 lb (78 kg)   LMP 06/26/2021 (Approximate)   SpO2 97%   BMI 26.94 kg/m  Wt Readings from Last 3 Encounters:  07/05/21 172 lb (78 kg)  06/06/21 180 lb (81.6 kg)  02/22/21 175 lb (79.4 kg)     Health Maintenance Due  Topic Date Due   INFLUENZA VACCINE  06/19/2021    There are no preventive care reminders to display for this patient.  Lab Results  Component Value Date   TSH 0.883 06/28/2021   Lab Results  Component Value Date   WBC 7.0 06/28/2021   HGB 13.4 06/28/2021   HCT 40.2 06/28/2021   MCV 90 06/28/2021   PLT 403 06/28/2021   Lab Results  Component Value Date   NA 139 06/28/2021   K 4.5 06/28/2021   CO2 21 06/28/2021   GLUCOSE 89 06/28/2021   BUN 10 06/28/2021   CREATININE 0.66 06/28/2021   BILITOT 0.5 06/28/2021   ALKPHOS 66 06/28/2021   AST 17 06/28/2021   Goodwin 20 06/28/2021   PROT 7.0 06/28/2021   ALBUMIN 4.2 06/28/2021   CALCIUM 9.7 06/28/2021   ANIONGAP 10 11/28/2018   EGFR 121 06/28/2021   Lab Results  Component Value Date   CHOL 202 (H) 06/28/2021   Lab Results  Component Value Date   HDL 53 06/28/2021   Lab Results  Component Value Date   LDLCALC 138 (H) 06/28/2021   Lab Results  Component Value Date   TRIG 60 06/28/2021   No results found for: CHOLHDL No results found for: HGBA1C    Assessment & Plan:   Problem List Items Addressed  This Visit       Endocrine   Enlarged thyroid    -will get [place] and T3 and T4 labs      Relevant Orders   T3, free   T4, free   US THYROID   TSH + free T4     Other   Panic disorder    -has been taking buspar daily instead of TID for anxiety; states it makes her feel funny; she can stop buspar -GAD-7 = 4 -she doesn't have generalized anxiety; 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 hydroxyzine      Relevant Medications   hydrOXYzine (VISTARIL) 25 MG capsule   Encounter for general adult medical examination with abnormal findings - Primary    -enlarged thyroid on exam today      HLD (hyperlipidemia)    Lab Results  Component Value Date   CHOL 202 (H) 06/28/2021   HDL 53 06/28/2021   LDLCALC 138 (H) 06/28/2021   TRIG 60 06/28/2021  -Rx. Atorvastatin; no risk of being or becoming pregnant      Relevant Medications   atorvastatin (LIPITOR) 20 MG tablet   Other Relevant Orders   CBC with Differential/Platelet  CMP14+EGFR   Lipid Panel With LDL/HDL Ratio    Meds ordered this encounter  Medications   hydrOXYzine (VISTARIL) 25 MG capsule    Sig: Take 1 capsule (25 mg total) by mouth every 8 (eight) hours as needed.    Dispense:  30 capsule    Refill:  0   atorvastatin (LIPITOR) 20 MG tablet    Sig: Take 1 tablet (20 mg total) by mouth daily.    Dispense:  90 tablet    Refill:  3    Follow-up: Return in about 3 months (around 10/05/2021) for Lab follow-up (HLD, anxiety, enlarged thyroid).    Noreene Larsson, NP

## 2021-07-05 NOTE — Assessment & Plan Note (Signed)
-  enlarged thyroid on exam today

## 2021-07-05 NOTE — Patient Instructions (Signed)
Please have thyroid labs drawn today.  We will recheck cholesterol in 3 months. Please have fasting labs drawn 2-3 days prior to your appointment so we can discuss the results during your office visit.

## 2021-07-05 NOTE — Assessment & Plan Note (Signed)
-  will get [place] and T3 and T4 labs

## 2021-07-06 NOTE — Progress Notes (Signed)
Thyroid labs are WNL.

## 2021-07-07 LAB — T4, FREE: Free T4: 0.99 ng/dL (ref 0.82–1.77)

## 2021-07-07 LAB — SPECIMEN STATUS REPORT

## 2021-07-07 LAB — T3, FREE: T3, Free: 3.6 pg/mL (ref 2.0–4.4)

## 2021-07-10 ENCOUNTER — Ambulatory Visit (HOSPITAL_COMMUNITY)
Admission: RE | Admit: 2021-07-10 | Discharge: 2021-07-10 | Disposition: A | Discharge status: Home / Self Care | Payer: BC Managed Care – PPO | Source: Ambulatory Visit | Attending: Nurse Practitioner | Admitting: Nurse Practitioner

## 2021-07-10 ENCOUNTER — Other Ambulatory Visit: Payer: Self-pay

## 2021-07-10 DIAGNOSIS — E049 Nontoxic goiter, unspecified: Secondary | ICD-10-CM | POA: Insufficient documentation

## 2021-07-10 NOTE — Progress Notes (Signed)
Good news. No discrete nodules on thyroid U/S, so no need for further work-up.

## 2021-09-25 ENCOUNTER — Other Ambulatory Visit: Payer: Self-pay | Admitting: Nurse Practitioner

## 2021-09-25 DIAGNOSIS — U071 COVID-19: Secondary | ICD-10-CM

## 2021-10-04 ENCOUNTER — Ambulatory Visit: Payer: BC Managed Care – PPO | Admitting: Nurse Practitioner

## 2021-11-08 ENCOUNTER — Ambulatory Visit: Payer: BC Managed Care – PPO | Admitting: Nurse Practitioner

## 2021-11-15 ENCOUNTER — Other Ambulatory Visit: Payer: Self-pay

## 2021-11-15 ENCOUNTER — Ambulatory Visit: Payer: BC Managed Care – PPO | Admitting: Nurse Practitioner

## 2021-11-16 IMAGING — US US THYROID
1 series · 14 of 25 positions shown · non-contrast
Comparison: None.

CLINICAL DATA: Palpable abnormality.  Thyromegaly on physical exam.

EXAM:
THYROID ULTRASOUND
TECHNIQUE: Ultrasound examination of the thyroid gland and adjacent soft
tissues was performed.

[Series 1: us thyroid · 14 of 43 slices shown]
[im 1/43]
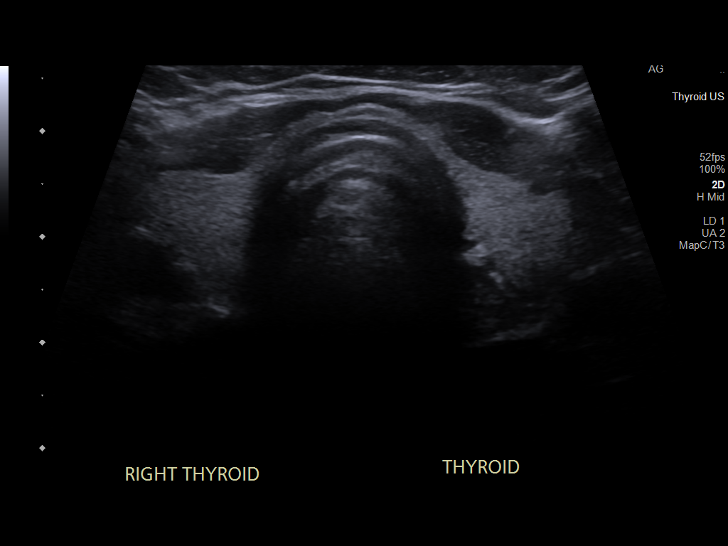
[im 4/43]
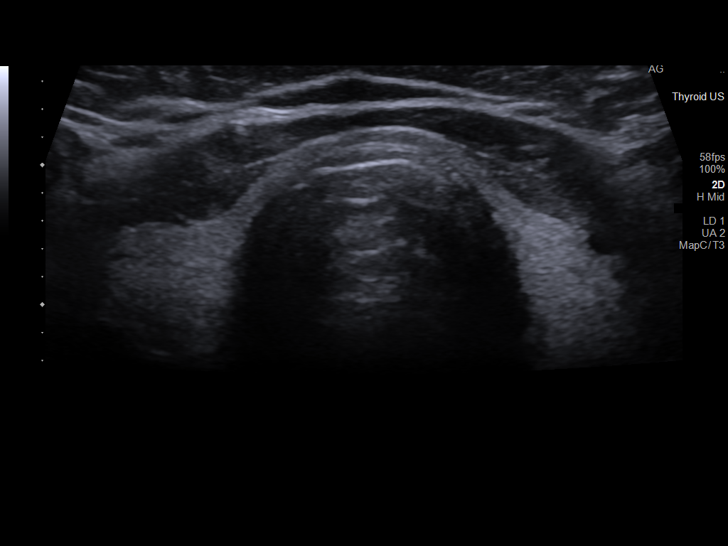
[im 8/43]
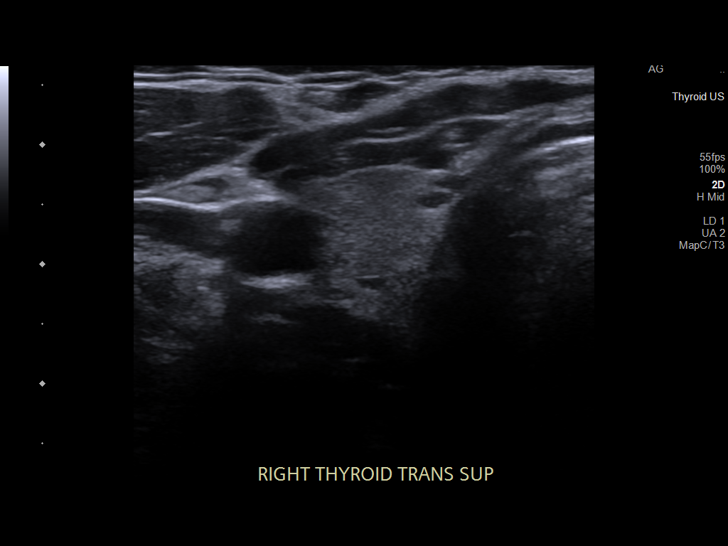
[im 11/43]
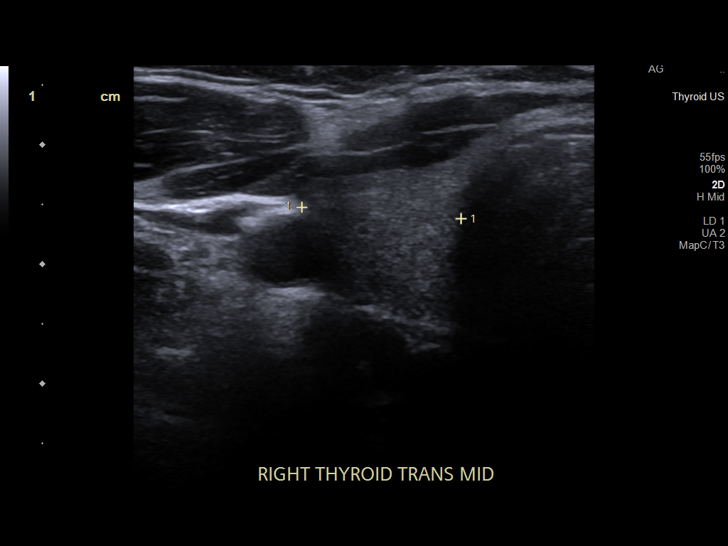
[im 15/43]
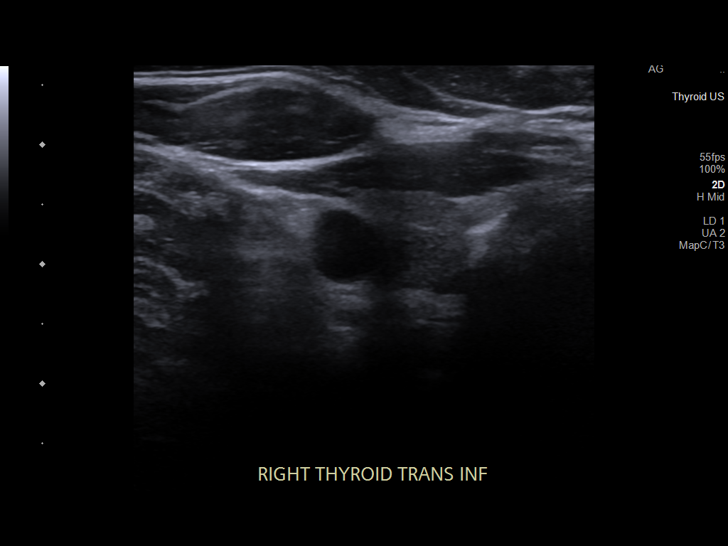
[im 16/43]
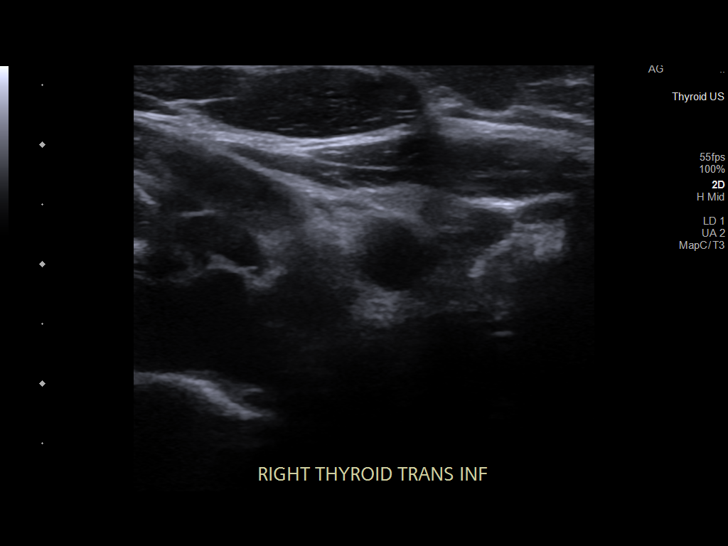
[im 20/43]
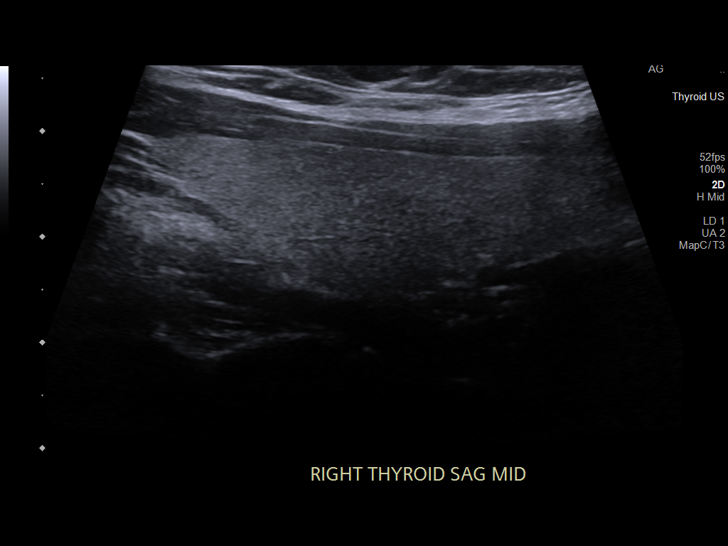
[im 23/43]
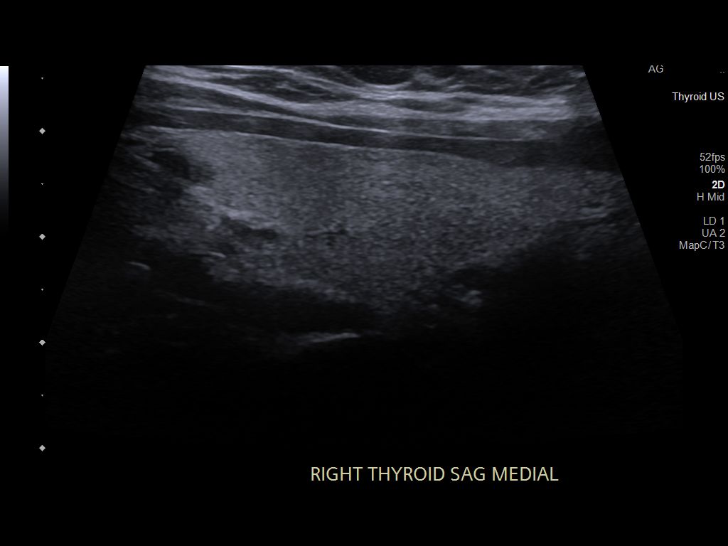
[im 27/43]
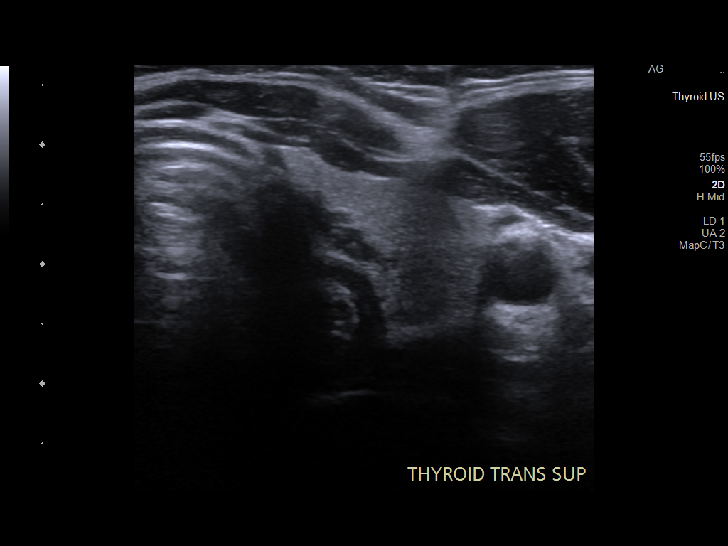
[im 29/43]
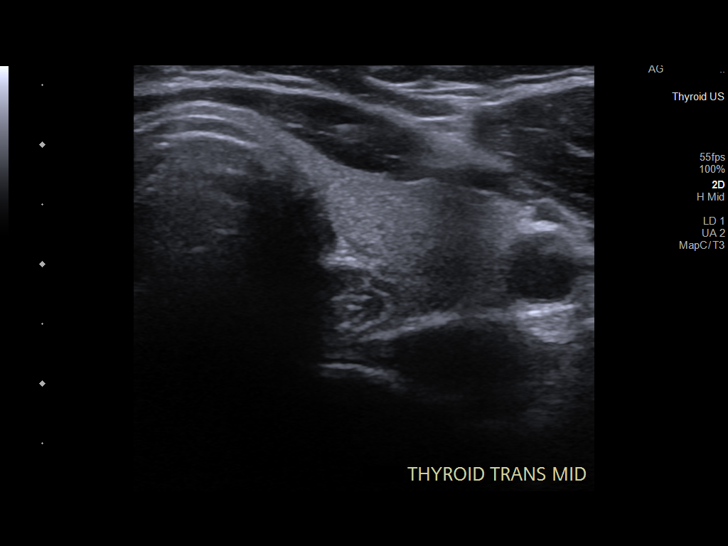
[im 32/43]
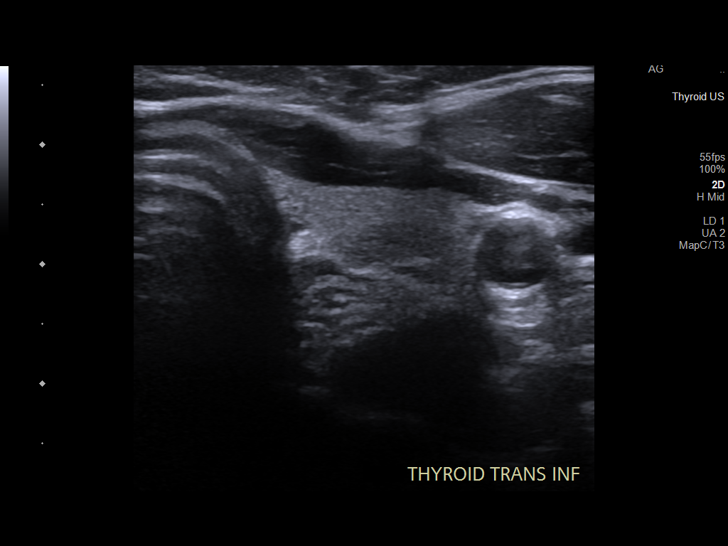
[im 36/43]
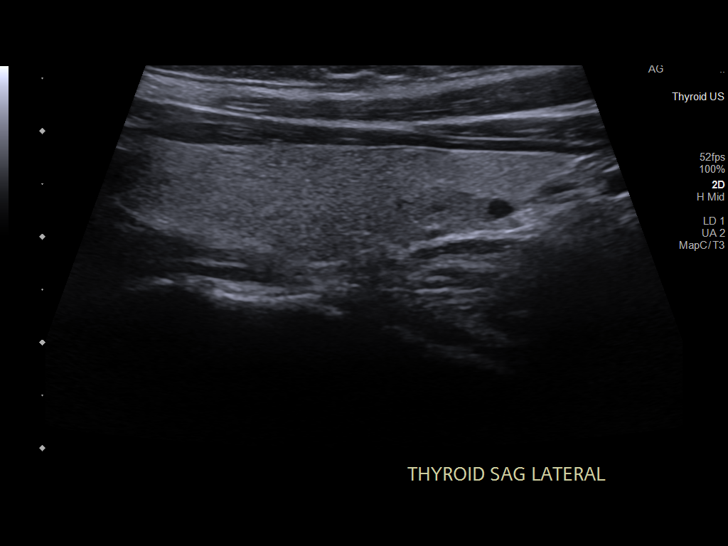
[im 39/43]
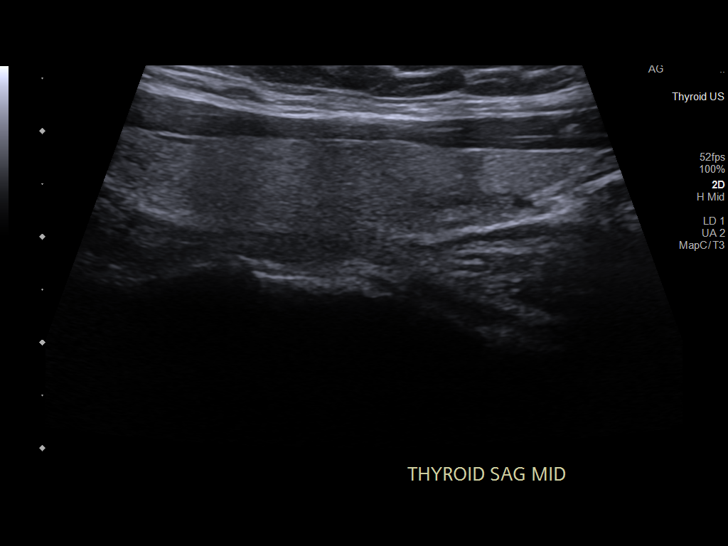
[im 43/43]
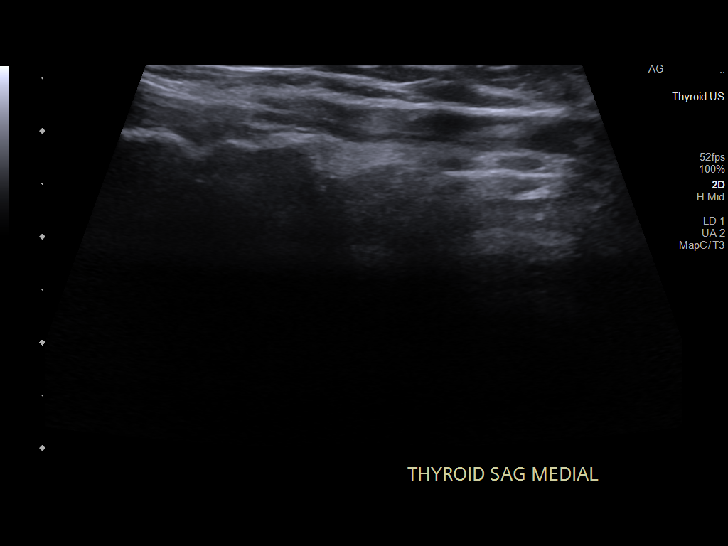

[14 of 25 positions shown; findings below may reference images not displayed]

FINDINGS: Parenchymal Echotexture: Normal

Isthmus: 0.2 cm

Right lobe: 4.8 x 1.4 x 1.3 cm

Left lobe: 4.5 x 1.0 x 1.8 cm

_________________________________________________________

Estimated total number of nodules >/= 1 cm: 0

Number of spongiform nodules >/=  2 cm not described below (TR1): 0

Number of mixed cystic and solid nodules >/= 1.5 cm not described
below (TR2): 0

_________________________________________________________

No discrete nodules are seen within the thyroid gland.
IMPRESSION: Normal sonographic appearance of the thyroid gland.

## 2021-11-29 ENCOUNTER — Ambulatory Visit: Payer: BC Managed Care – PPO | Admitting: Nurse Practitioner

## 2021-11-29 ENCOUNTER — Other Ambulatory Visit: Payer: Self-pay

## 2021-11-29 ENCOUNTER — Encounter: Payer: Self-pay | Admitting: Nurse Practitioner

## 2021-11-29 VITALS — BP 128/82 | HR 75 | Ht 67.0 in | Wt 166.0 lb

## 2021-11-29 DIAGNOSIS — E7800 Pure hypercholesterolemia, unspecified: Secondary | ICD-10-CM

## 2021-11-29 DIAGNOSIS — Z1321 Encounter for screening for nutritional disorder: Secondary | ICD-10-CM | POA: Diagnosis not present

## 2021-11-29 DIAGNOSIS — F410 Panic disorder [episodic paroxysmal anxiety] without agoraphobia: Secondary | ICD-10-CM

## 2021-11-29 NOTE — Patient Instructions (Signed)
Please get your labs done today  ° ° °It is important that you exercise regularly at least 30 minutes 5 times a week.  °Think about what you will eat, plan ahead. °Choose " clean, green, fresh or frozen" over canned, processed or packaged foods which are more sugary, salty and fatty. °70 to 75% of food eaten should be vegetables and fruit. °Three meals at set times with snacks allowed between meals, but they must be fruit or vegetables. °Aim to eat over a 12 hour period , example 7 am to 7 pm, and STOP after  your last meal of the day. °Drink water,generally about 64 ounces per day, no other drink is as healthy. Fruit juice is best enjoyed in a healthy way, by EATING the fruit. ° °Thanks for choosing [facility], we consider it a privelige to serve you. ° °

## 2021-11-29 NOTE — Assessment & Plan Note (Signed)
Lipid panel today  Taking atorvastatin 20mg  daily.

## 2021-11-29 NOTE — Assessment & Plan Note (Signed)
Doing well on vistaril 25mg  PRN.  Continue current med. denies depression.

## 2021-11-29 NOTE — Progress Notes (Signed)
° °  Tiffany Goodwin     MRN: [id number]      DOB: July 26, 1991   HPI Tiffany Goodwin is here for follow up and re-evaluation of chronic medical conditions, medication management and review of any available recent lab and radiology data.  Preventive health is updated, specifically  Cancer screening and Immunization.    The PT denies any adverse reactions to current medications since the last visit.  There are no new concerns.  There are no specific complaints    Pt refused flu vaccine today.   ROS Denies recent fever or chills. Denies sinus pressure, nasal congestion, ear pain or sore throat. Denies chest congestion, productive cough or wheezing. Denies chest pains, palpitations and leg swelling Denies abdominal pain, nausea, vomiting,diarrhea or constipation.   Denies dysuria, frequency, hesitancy or incontinence. Denies joint pain, swelling and limitation in mobility. Denies headaches, seizures, numbness, or tingling. Denies depression, anxiety or insomnia. Denies skin break down or rash.   PE  BP 128/82 (BP Location: Right Arm, Patient Position: Sitting, Cuff Size: Normal)    Pulse 75    Ht 5\' 7"  (1.702 m)    Wt 166 lb (75.3 kg)    LMP 11/15/2021 (Approximate)    SpO2 98%    BMI 26.00 kg/m   Patient alert and oriented and in no cardiopulmonary distress.  HEENT: No facial asymmetry, EOMI,     Neck supple .  Chest: Clear to auscultation bilaterally.  CVS: S1, S2 no murmurs, no S3.Regular rate.  ABD: Soft non tender.   Ext: No edema  MS: Adequate ROM spine, shoulders, hips and knees.  Skin: Intact, no ulcerations or rash noted.  Psych: Good eye contact, normal affect. Memory intact not anxious or depressed appearing.  CNS: CN 2-12 intact, power,  normal throughout.no focal deficits noted.   Assessment & Plan

## 2021-11-30 LAB — LIPID PANEL
Chol/HDL Ratio: 2.5 ratio (ref 0.0–4.4)
Cholesterol, Total: 133 mg/dL (ref 100–199)
HDL: 53 mg/dL (ref >39)
LDL Chol Calc (NIH): 67 mg/dL (ref 0–99)
Triglycerides: 63 mg/dL (ref 0–149)
VLDL Cholesterol Cal: 13 mg/dL (ref 5–40)

## 2021-11-30 LAB — CMP14+EGFR
ALT: 13 IU/L (ref 0–32)
AST: 15 IU/L (ref 0–40)
Albumin/Globulin Ratio: 1.2 (ref 1.2–2.2)
Albumin: 3.9 g/dL (ref 3.9–5.0)
Alkaline Phosphatase: 75 IU/L (ref 44–121)
BUN/Creatinine Ratio: 15 (ref 9–23)
BUN: 11 mg/dL (ref 6–20)
Bilirubin Total: 0.5 mg/dL (ref 0.0–1.2)
CO2: 23 mmol/L (ref 20–29)
Calcium: 9.4 mg/dL (ref 8.7–10.2)
Chloride: 105 mmol/L (ref 96–106)
Creatinine, Ser: 0.71 mg/dL (ref 0.57–1.00)
Globulin, Total: 3.2 g/dL (ref 1.5–4.5)
Glucose: 76 mg/dL (ref 70–99)
Potassium: 4.4 mmol/L (ref 3.5–5.2)
Sodium: 142 mmol/L (ref 134–144)
Total Protein: 7.1 g/dL (ref 6.0–8.5)
eGFR: 117 mL/min/{1.73_m2} (ref >59)

## 2021-11-30 LAB — VITAMIN D 25 HYDROXY (VIT D DEFICIENCY, FRACTURES): Vit D, 25-Hydroxy: 44.2 ng/mL (ref 30.0–100.0)

## 2021-11-30 NOTE — Progress Notes (Signed)
Please review labs with pt.Cholesterol level is good, continue atorvastatin  Kidney function and vitamin D level was normal. Thanks

## 2022-01-01 ENCOUNTER — Other Ambulatory Visit: Payer: Self-pay

## 2022-01-01 DIAGNOSIS — U071 COVID-19: Secondary | ICD-10-CM

## 2022-01-01 MED ORDER — IBUPROFEN 600 MG PO TABS: ORAL_TABLET | ORAL | 0 refills | Status: DC

## 2022-02-28 ENCOUNTER — Ambulatory Visit (INDEPENDENT_AMBULATORY_CARE_PROVIDER_SITE_OTHER): Payer: BC Managed Care – PPO | Admitting: Adult Health

## 2022-02-28 ENCOUNTER — Encounter: Payer: Self-pay | Admitting: Adult Health

## 2022-02-28 ENCOUNTER — Other Ambulatory Visit (HOSPITAL_COMMUNITY)
Admission: RE | Admit: 2022-02-28 | Discharge: 2022-02-28 | Disposition: A | Discharge status: Home / Self Care | Payer: BC Managed Care – PPO | Source: Ambulatory Visit | Attending: Adult Health | Admitting: Adult Health

## 2022-02-28 VITALS — BP 129/80 | HR 74 | Ht 67.0 in | Wt 161.0 lb

## 2022-02-28 DIAGNOSIS — Z3041 Encounter for surveillance of contraceptive pills: Secondary | ICD-10-CM | POA: Diagnosis not present

## 2022-02-28 DIAGNOSIS — Z113 Encounter for screening for infections with a predominantly sexual mode of transmission: Secondary | ICD-10-CM | POA: Diagnosis not present

## 2022-02-28 DIAGNOSIS — Z1211 Encounter for screening for malignant neoplasm of colon: Secondary | ICD-10-CM | POA: Diagnosis not present

## 2022-02-28 DIAGNOSIS — F419 Anxiety disorder, unspecified: Secondary | ICD-10-CM | POA: Diagnosis not present

## 2022-02-28 DIAGNOSIS — K625 Hemorrhage of anus and rectum: Secondary | ICD-10-CM

## 2022-02-28 DIAGNOSIS — K649 Unspecified hemorrhoids: Secondary | ICD-10-CM

## 2022-02-28 DIAGNOSIS — Z01419 Encounter for gynecological examination (general) (routine) without abnormal findings: Secondary | ICD-10-CM | POA: Diagnosis not present

## 2022-02-28 DIAGNOSIS — K5900 Constipation, unspecified: Secondary | ICD-10-CM

## 2022-02-28 LAB — HEMOCCULT GUIAC POC 1CARD (OFFICE): Fecal Occult Blood, POC: NEGATIVE

## 2022-02-28 MED ORDER — HYDROCORT-PRAMOXINE (PERIANAL) 1-1 % EX FOAM: CUTANEOUS | 1 refills | Status: DC

## 2022-02-28 MED ORDER — NORETHIN ACE-ETH ESTRAD-FE 1-20 MG-MCG(24) PO TABS: 1.0000 | ORAL_TABLET | Freq: Every day | ORAL | 4 refills | Status: DC

## 2022-02-28 MED ORDER — POLYETHYLENE GLYCOL 3350 17 G PO PACK: 17.0000 g | PACK | Freq: Every day | ORAL | 99 refills | Status: DC

## 2022-02-28 NOTE — Progress Notes (Signed)
Patient ID: Tiffany Goodwin, female   DOB: [birth year], 31 y.o.   MRN: [id number] ?History of Present Illness: ?Tiffany Goodwin is a 31 year old white female,single, G2P2 in for a well woman gyn exam. She is having rectal bleeding 2-3 x a week, has constipation and it hurts with BM.  ?PCP is F. Paseda NP. ? ?Lab Results  ?Component Value Date  ? DIAGPAP  02/22/2021  ?  - Negative for Intraepithelial Lesions or Malignancy (NILM)  ? DIAGPAP - Benign reactive/reparative changes 02/22/2021  ? HPV DETECTED (A) 03/31/2018  ? HPVHIGH Negative 02/22/2021  ?  ?Current Medications, Allergies, Past Medical History, Past Surgical History, Family History and Social History were reviewed in [employer] record.   ? ? ?Review of Systems: ?Patient denies any headaches, hearing loss, fatigue, blurred vision, shortness of breath, chest pain, abdominal pain, problems with urination, or intercourse. No joint pain or mood swings. She is happy with OCs. She does have new sex partner. ?See HPI for positives. ? ? ?Physical Exam:BP 129/80 (BP Location: Right Arm, Patient Position: Sitting, Cuff Size: Normal)   Pulse 74   Ht 5\' 7"  (1.702 m)   Wt 161 lb (73 kg)   LMP 01/31/2022 (Exact Date)   BMI 25.22 kg/m?   ?General:  Well developed, well nourished, no acute distress ?Skin:  Warm and dry ?Neck:  Midline trachea, normal thyroid, good ROM, no lymphadenopathy ?Lungs; Clear to auscultation bilaterally ?Breast:  No dominant palpable mass, retraction, or nipple discharge ?Cardiovascular: Regular rate and rhythm ?Abdomen:  Soft, non tender, no hepatosplenomegaly ?Pelvic:  External genitalia is normal in appearance, no lesions.  The vagina is normal in appearance. Urethra has no lesions or masses. The cervix is bulbous.  Uterus is felt to be normal size, shape, and contour.  No adnexal masses or tenderness noted.Bladder is non tender, no masses felt. CV swab obtained. ?Rectal: Good sphincter tone, no polyps,+ hemorrhoids felt.   Hemoccult negative. ?Extremities/musculoskeletal:  No swelling or varicosities noted, no clubbing or cyanosis ?Psych:  No mood changes, alert and cooperative,seems happy ?AA is 1 ?Fall risk is low ? ?  02/28/2022  ? 10:44 AM 11/29/2021  ? 11:20 AM 07/05/2021  ?  1:27 PM  ?Depression screen PHQ 2/9  ?Decreased Interest 1 0 0  ?Down, Depressed, Hopeless 1 0 0  ?PHQ - 2 Score 2 0 0  ?Altered sleeping 1    ?Tired, decreased energy 2    ?Change in appetite 0    ?Feeling bad or failure about yourself  0    ?Trouble concentrating 0    ?Moving slowly or fidgety/restless 0    ?Suicidal thoughts 0    ?PHQ-9 Score 5    ?  ? ?  02/28/2022  ? 10:44 AM 07/05/2021  ?  1:40 PM 02/22/2021  ?  9:43 AM 08/10/2020  ?  3:52 PM  ?GAD 7 : Generalized Anxiety Score  ?Nervous, Anxious, on Edge 1 1 1  0  ?Control/stop worrying 0 0 1 0  ?Worry too much - different things 2 1 0 0  ?Trouble relaxing 1 0 0 1  ?Restless 0 0 0 0  ?Easily annoyed or irritable 2 2 1 1   ?Afraid - awful might happen 0 0 0 0  ?Total GAD 7 Score 6 4 3 2   ?Anxiety Difficulty  Not difficult at all  Not difficult at all  ? ?  ? [facility] - 02/28/22 1043   ? ?  ? Pregnancy Intention  Screening  ? Does the patient want to become pregnant in the next year? No   ? Does the patient's partner want to become pregnant in the next year? No   ? Would the patient like to discuss contraceptive options today? No   ?  ? Contraception Wrap Up  ? Current Method Oral Contraceptive   ? End Method Oral Contraceptive   ? Contraception Counseling Provided No   ? ?  ?  ? ?  ? Co exam with Lorraine Lax NP student ? ?Impression and Plan: ?1. Encounter for well woman exam with routine gynecological exam ?Physical in 1 year ?Pap in 2025 ?Labs with PCP ? ?2. Encounter for surveillance of contraceptive pills ?Continue OCs ? ? ?Meds ordered this encounter  ?Medications  ? polyethylene glycol (MIRALAX / GLYCOLAX) 17 g packet  ?  Sig: Take 17 g by mouth daily.  ?  Dispense:  14 each  ?  Refill:  PRN  ?  Order  Specific Question:   Supervising Provider  ?  Answer:   Duane Lope H [[id number]]  ? hydrocortisone-pramoxine (PROCTOFOAM-HC) rectal foam  ?  Sig: Use tid prn  ?  Dispense:  10 g  ?  Refill:  1  ?  Order Specific Question:   Supervising Provider  ?  Answer:   Duane Lope H [[id number]]  ? Norethindrone Acetate-Ethinyl Estrad-FE (LOESTRIN 24 FE) 1-20 MG-MCG(24) tablet  ?  Sig: Take 1 tablet by mouth daily.  ?  Dispense:  84 tablet  ?  Refill:  4  ?  Order Specific Question:   Supervising Provider  ?  Answer:   Duane Lope H [[id number]]  ?  ? ?3. Anxiety ?She has buspar at home, can resume ? ?4. Hemorrhoids, unspecified hemorrhoid type ?Will rx proctofoam HC tid prn ? ?5. Constipation, unspecified constipation type ?Take miralax daily ? ?6. Screening examination for STD (sexually transmitted disease) ?CV swab sent for CG/CHL,trich,BV and yeast  ? ? ?7. Rectal bleeding ?I fell may related to hard BM and hemorrhoids,  ?Will refer to [facility], Lewie Loron NP ? ?8. Encounter for screening fecal occult blood testing ?Hemoccult was negative  ? ? ?  ?  ?

## 2022-02-28 NOTE — Addendum Note (Signed)
Addended by: Annamarie Dawley on: 02/28/2022 11:44 AM ? ? Modules accepted: Orders ? ?

## 2022-03-01 ENCOUNTER — Other Ambulatory Visit: Payer: Self-pay | Admitting: Adult Health

## 2022-03-01 ENCOUNTER — Other Ambulatory Visit: Payer: Self-pay | Admitting: Nurse Practitioner

## 2022-03-01 ENCOUNTER — Encounter: Payer: Self-pay | Admitting: Internal Medicine

## 2022-03-01 DIAGNOSIS — U071 COVID-19: Secondary | ICD-10-CM

## 2022-03-01 LAB — CERVICOVAGINAL ANCILLARY ONLY
Bacterial Vaginitis (gardnerella): POSITIVE — AB
Candida Glabrata: NEGATIVE
Candida Vaginitis: NEGATIVE
Chlamydia: NEGATIVE
Comment: NEGATIVE
Comment: NEGATIVE
Comment: NEGATIVE
Comment: NEGATIVE
Comment: NEGATIVE
Comment: NORMAL
Neisseria Gonorrhea: NEGATIVE
Trichomonas: NEGATIVE

## 2022-03-01 MED ORDER — METRONIDAZOLE 500 MG PO TABS: 500.0000 mg | ORAL_TABLET | Freq: Two times a day (BID) | ORAL | 0 refills | Status: DC

## 2022-03-01 NOTE — Progress Notes (Signed)
+  BV on vaginal swab, no STDs, will rx flagyl, no sex or alcohol during treatment  ?

## 2022-03-05 ENCOUNTER — Encounter: Payer: Self-pay | Admitting: Internal Medicine

## 2022-03-05 DIAGNOSIS — B078 Other viral warts: Secondary | ICD-10-CM | POA: Diagnosis not present

## 2022-03-27 ENCOUNTER — Ambulatory Visit: Payer: BC Managed Care – PPO | Admitting: Gastroenterology

## 2022-03-29 ENCOUNTER — Other Ambulatory Visit: Payer: BC Managed Care – PPO

## 2022-04-02 ENCOUNTER — Other Ambulatory Visit: Payer: BC Managed Care – PPO

## 2022-04-04 ENCOUNTER — Ambulatory Visit: Payer: BC Managed Care – PPO | Admitting: Gastroenterology

## 2022-04-10 ENCOUNTER — Other Ambulatory Visit (HOSPITAL_COMMUNITY)
Admission: RE | Admit: 2022-04-10 | Discharge: 2022-04-10 | Disposition: A | Discharge status: Home / Self Care | Payer: BC Managed Care – PPO | Source: Ambulatory Visit | Attending: Obstetrics & Gynecology | Admitting: Obstetrics & Gynecology

## 2022-04-10 ENCOUNTER — Other Ambulatory Visit (INDEPENDENT_AMBULATORY_CARE_PROVIDER_SITE_OTHER): Payer: BC Managed Care – PPO

## 2022-04-10 DIAGNOSIS — Z113 Encounter for screening for infections with a predominantly sexual mode of transmission: Secondary | ICD-10-CM | POA: Diagnosis not present

## 2022-04-10 DIAGNOSIS — N898 Other specified noninflammatory disorders of vagina: Secondary | ICD-10-CM | POA: Insufficient documentation

## 2022-04-10 NOTE — Progress Notes (Signed)
   NURSE VISIT- VAGINITIS/STD  SUBJECTIVE:  Tiffany Goodwin is a 31 y.o. [id number] GYN patientfemale here for a vaginal swab for vaginitis screening, STD screen.  She reports the following symptoms: discharge described as clear and malodorous, local irritation, odor, and vulvar itching for 2 weeks. Denies abnormal vaginal bleeding, significant pelvic pain, fever, or UTI symptoms.  OBJECTIVE:  There were no vitals taken for this visit.  Appears well, in no apparent distress  ASSESSMENT: Vaginal swab for  vaginitis & STD screening  PLAN: Self-collected vaginal probe for Gonorrhea, Chlamydia, Trichomonas, Bacterial Vaginosis, Yeast sent to lab Treatment: to be determined once results are received Follow-up as needed if symptoms persist/worsen, or new symptoms develop  Mynor Witkop A Erin Obando  04/10/2022 2:39 PM

## 2022-04-12 ENCOUNTER — Other Ambulatory Visit: Payer: Self-pay | Admitting: Obstetrics & Gynecology

## 2022-04-12 DIAGNOSIS — B9689 Other specified bacterial agents as the cause of diseases classified elsewhere: Secondary | ICD-10-CM

## 2022-04-12 LAB — CERVICOVAGINAL ANCILLARY ONLY
Bacterial Vaginitis (gardnerella): POSITIVE — AB
Candida Glabrata: NEGATIVE
Candida Vaginitis: NEGATIVE
Chlamydia: NEGATIVE
Comment: NEGATIVE
Comment: NEGATIVE
Comment: NEGATIVE
Comment: NEGATIVE
Comment: NEGATIVE
Comment: NORMAL
Neisseria Gonorrhea: NEGATIVE
Trichomonas: NEGATIVE

## 2022-04-12 MED ORDER — METRONIDAZOLE 500 MG PO TABS: 500.0000 mg | ORAL_TABLET | Freq: Two times a day (BID) | ORAL | 2 refills | Status: AC

## 2022-04-12 NOTE — Progress Notes (Signed)
Rx for BV sent in  Myna Hidalgo, DO Attending Obstetrician & Gynecologist, [facility] for [facility], [facility]

## 2022-04-27 ENCOUNTER — Other Ambulatory Visit: Payer: Self-pay | Admitting: Nurse Practitioner

## 2022-04-27 DIAGNOSIS — U071 COVID-19: Secondary | ICD-10-CM

## 2022-05-23 ENCOUNTER — Other Ambulatory Visit: Payer: Self-pay | Admitting: Nurse Practitioner

## 2022-05-23 DIAGNOSIS — U071 COVID-19: Secondary | ICD-10-CM

## 2022-06-27 ENCOUNTER — Other Ambulatory Visit: Payer: Self-pay | Admitting: Nurse Practitioner

## 2022-06-27 DIAGNOSIS — E7800 Pure hypercholesterolemia, unspecified: Secondary | ICD-10-CM

## 2022-06-27 MED ORDER — ATORVASTATIN CALCIUM 20 MG PO TABS: 20.0000 mg | ORAL_TABLET | Freq: Every day | ORAL | 3 refills | Status: DC

## 2022-07-04 DIAGNOSIS — L989 Disorder of the skin and subcutaneous tissue, unspecified: Secondary | ICD-10-CM | POA: Diagnosis not present

## 2022-07-04 DIAGNOSIS — Z3041 Encounter for surveillance of contraceptive pills: Secondary | ICD-10-CM | POA: Diagnosis not present

## 2022-07-04 DIAGNOSIS — Z113 Encounter for screening for infections with a predominantly sexual mode of transmission: Secondary | ICD-10-CM | POA: Diagnosis not present

## 2022-07-04 DIAGNOSIS — Z114 Encounter for screening for human immunodeficiency virus [HIV]: Secondary | ICD-10-CM | POA: Diagnosis not present

## 2022-07-31 ENCOUNTER — Encounter: Payer: BC Managed Care – PPO | Admitting: Nurse Practitioner

## 2022-08-02 ENCOUNTER — Other Ambulatory Visit: Payer: Self-pay | Admitting: Nurse Practitioner

## 2022-08-02 DIAGNOSIS — U071 COVID-19: Secondary | ICD-10-CM

## 2022-10-23 ENCOUNTER — Other Ambulatory Visit: Payer: Self-pay | Admitting: Nurse Practitioner

## 2022-10-23 DIAGNOSIS — U071 COVID-19: Secondary | ICD-10-CM

## 2022-11-14 DIAGNOSIS — E663 Overweight: Secondary | ICD-10-CM | POA: Diagnosis not present

## 2022-11-14 DIAGNOSIS — J069 Acute upper respiratory infection, unspecified: Secondary | ICD-10-CM | POA: Diagnosis not present

## 2022-11-14 DIAGNOSIS — R030 Elevated blood-pressure reading, without diagnosis of hypertension: Secondary | ICD-10-CM | POA: Diagnosis not present

## 2022-11-14 DIAGNOSIS — Z6825 Body mass index (BMI) 25.0-25.9, adult: Secondary | ICD-10-CM | POA: Diagnosis not present

## 2023-01-28 ENCOUNTER — Encounter: Payer: Self-pay | Admitting: Family Medicine

## 2023-01-28 ENCOUNTER — Ambulatory Visit: Payer: BC Managed Care – PPO | Admitting: Family Medicine

## 2023-01-28 VITALS — BP 132/78 | HR 83 | Ht 67.0 in | Wt 157.0 lb

## 2023-01-28 DIAGNOSIS — M62838 Other muscle spasm: Secondary | ICD-10-CM | POA: Diagnosis not present

## 2023-01-28 DIAGNOSIS — M25512 Pain in left shoulder: Secondary | ICD-10-CM | POA: Diagnosis not present

## 2023-01-28 DIAGNOSIS — M25511 Pain in right shoulder: Secondary | ICD-10-CM

## 2023-01-28 DIAGNOSIS — J069 Acute upper respiratory infection, unspecified: Secondary | ICD-10-CM | POA: Diagnosis not present

## 2023-01-28 MED ORDER — PREDNISONE 10 MG PO TABS: 10.0000 mg | ORAL_TABLET | Freq: Every day | ORAL | 0 refills | Status: AC

## 2023-01-28 MED ORDER — ALBUTEROL SULFATE HFA 108 (90 BASE) MCG/ACT IN AERS: 2.0000 | INHALATION_SPRAY | Freq: Four times a day (QID) | RESPIRATORY_TRACT | 2 refills | Status: DC | PRN

## 2023-01-28 MED ORDER — AZELASTINE-FLUTICASONE 137-50 MCG/ACT NA SUSP: 1.0000 | Freq: Two times a day (BID) | NASAL | 1 refills | Status: DC

## 2023-01-28 MED ORDER — LEVOCETIRIZINE DIHYDROCHLORIDE 5 MG PO TABS: 5.0000 mg | ORAL_TABLET | Freq: Every evening | ORAL | 1 refills | Status: DC

## 2023-01-28 MED ORDER — BENZONATATE 100 MG PO CAPS: 100.0000 mg | ORAL_CAPSULE | Freq: Two times a day (BID) | ORAL | 0 refills | Status: DC | PRN

## 2023-01-28 MED ORDER — CYCLOBENZAPRINE HCL 5 MG PO TABS: 5.0000 mg | ORAL_TABLET | Freq: Three times a day (TID) | ORAL | 1 refills | Status: DC | PRN

## 2023-01-28 NOTE — Assessment & Plan Note (Addendum)
Prescribed Flexeril 5 mg as initial therapy Explained to patient Non pharmacological interventions include the use of ice or heat, rest, recommend range of motion exercises, gentle stretching. Start implementing proper body mechanics at work. Follow up for worsening or persistent symptoms. Patient verbalizes understanding regarding plan of care and all questions answered.  Follow up in 4-6 weeks

## 2023-01-28 NOTE — Assessment & Plan Note (Addendum)
Prescribed short 5 day course Prednisone 10 mg. Azelastine-fluticasone 137-50 mcg nasal spray, benzonatate 100 mg, xyzal 5 mg Symptomatic treatment, rest, increase oral fluid intake.  Follow-up for worsening or persistent symptoms. Patient verbalizes understanding regarding plan of care and all questions answered.

## 2023-01-28 NOTE — Progress Notes (Signed)
Patient Office Visit   Subjective   Patient ID: Tiffany Goodwin, female    DOB: Feb 05, 1991  Age: 32 y.o. MRN: [id number]  CC:  Chief Complaint  Patient presents with   Shoulder Pain    Patient complains of both shoulders hurting for a couple months. Did not injure them that she knows of.    Nasal Congestion    Patient complains of sinus congestion, cough starting 4 days ago.     HPI [employer] 32 year old female, presents to clinic for bilateral shoulder pain, chest congestion.She  has a past medical history of Abnormal Pap smear of cervix (04/03/2018), Anxiety, BV (bacterial vaginosis) (10/21/2014), Itching in the vaginal area (12/07/2015), LLQ pain (04/19/2015), Missed period (04/19/2015), Patient desires pregnancy (12/07/2015), Pelvic pain in female (11/18/2014), Pneumonia (2016, january; 12/2018), Screening for STD (sexually transmitted disease) (01/20/2015), Spider vein of right lower extremity (09/04/2017), and Vaginal discharge (10/21/2014).  Patient complains of evaluation of cough. Patient describes symptoms of chest congestion, itchy throat, watery eyes, clear runny nose, dry cough, fatigue, headache, and malaise. Symptoms began 4 days ago and are gradually worsening since that time. Patient denies chest pain or nausea and vomiting. Treatment thus far includes OTC analgesics/antipyretics: somewhat effective.  Shoulder Pain  The pain is present in the right shoulder and left shoulder. The current episode started more than 1 month ago. Patient denies any injury mechanism or recent fall. The problem occurs constantly. The problem has been gradually worsening. The quality of the pain is described as aching and sharp. The pain is at a severity of 7/10. The pain is moderate. Pertinent negatives include no fever, inability to bear weight, itching, joint swelling, limited range of motion, numbness or stiffness. The symptoms are aggravated by activity and work related activity. 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 She has tried acetaminophen and NSAIDS for the symptoms. The treatment provided no relief.      Outpatient Encounter Medications as of 01/28/2023  Medication Sig   albuterol (VENTOLIN HFA) 108 (90 Base) MCG/ACT inhaler Inhale 2 puffs into the lungs every 6 (six) hours as needed for wheezing or shortness of breath.   atorvastatin (LIPITOR) 20 MG tablet Take 1 tablet (20 mg total) by mouth daily.   Azelastine-Fluticasone 137-50 MCG/ACT SUSP Place 1 spray into the nose every 12 (twelve) hours.   benzonatate (TESSALON) 100 MG capsule Take 1 capsule (100 mg total) by mouth 2 (two) times daily as needed for cough.   cyclobenzaprine (FLEXERIL) 5 MG tablet Take 1 tablet (5 mg total) by mouth 3 (three) times daily as needed for muscle spasms.   hydrocortisone-pramoxine (PROCTOFOAM-HC) rectal foam Use tid prn   hydrOXYzine (VISTARIL) 25 MG capsule Take 1 capsule (25 mg total) by mouth every 8 (eight) hours as needed.   ibuprofen (ADVIL) 600 MG tablet TAKE 1 TABLET BY MOUTH EVERY 8 HOURS AS NEEDED FOR HEADACHE, MILD PAIN, OR MODERATE PAIN   levocetirizine (XYZAL) 5 MG tablet Take 1 tablet (5 mg total) by mouth every evening.   Norethindrone Acetate-Ethinyl Estrad-FE (LOESTRIN 24 FE) 1-20 MG-MCG(24) tablet Take 1 tablet by mouth daily.   predniSONE (DELTASONE) 10 MG tablet Take 1 tablet (10 mg total) by mouth daily with breakfast for 5 days.   [DISCONTINUED] albuterol (VENTOLIN HFA) 108 (90 Base) MCG/ACT inhaler Inhale into the lungs.   polyethylene glycol (MIRALAX / GLYCOLAX) 17 g packet Take 17 g by mouth daily.   No facility-administered encounter medications on  file as of 01/28/2023.    Past Surgical History:  Procedure Laterality Date   TONSILLECTOMY     WISDOM TOOTH EXTRACTION     WISDOM TOOTH EXTRACTION      Review of Systems  Constitutional:  Negative for chills.  HENT:  Positive for congestion.   Eyes:  Negative for blurred vision and discharge.   Respiratory:  Positive for cough and shortness of breath.   Cardiovascular:  Negative for chest pain.  Gastrointestinal:  Negative for abdominal pain, nausea and vomiting.  Genitourinary:  Negative for dysuria.  Musculoskeletal:  Negative for back pain and falls.  Neurological:  Negative for dizziness and headaches.      Objective    BP 132/78   Pulse 83   Ht '5\' 7"'$  (1.702 m)   Wt 157 lb (71.2 kg)   SpO2 98%   BMI 24.59 kg/m   Physical Exam Vitals reviewed.  HENT:     Nose: Congestion and rhinorrhea present.     Mouth/Throat:     Mouth: Mucous membranes are moist.     Pharynx: No oropharyngeal exudate.  Cardiovascular:     Rate and Rhythm: Normal rate.     Pulses: Normal pulses.     Heart sounds: Normal heart sounds.  Pulmonary:     Effort: Pulmonary effort is normal.     Breath sounds: Normal breath sounds.  Musculoskeletal:     Right shoulder: No swelling or tenderness. Normal range of motion. Normal strength.     Left shoulder: No swelling or tenderness. Normal range of motion. Normal strength.     Cervical back: Normal range of motion and neck supple. No tenderness.  Skin:    General: Skin is warm and dry.  Neurological:     General: No focal deficit present.     Mental Status: She is alert.  Psychiatric:        Mood and Affect: Mood normal.       Assessment & Plan:  Muscle spasm of shoulder region -     Cyclobenzaprine HCl; Take 1 tablet (5 mg total) by mouth 3 (three) times daily as needed for muscle spasms.  Dispense: 30 tablet; Refill: 1  Bilateral shoulder pain, unspecified chronicity Assessment & Plan: Prescribed Flexeril 5 mg as initial therapy Explained to patient Non pharmacological interventions include the use of ice or heat, rest, recommend range of motion exercises, gentle stretching. Start implementing proper body mechanics at work. Follow up for worsening or persistent symptoms. Patient verbalizes understanding regarding plan of care and all  questions answered.  Follow up in 4-6 weeks   Viral upper respiratory illness Assessment & Plan: Prescribed short 5 day course Prednisone 10 mg. Azelastine-fluticasone 137-50 mcg nasal spray, benzonatate 100 mg, xyzal 5 mg Symptomatic treatment, rest, increase oral fluid intake.  Follow-up for worsening or persistent symptoms. Patient verbalizes understanding regarding plan of care and all questions answered.   Other orders -     Levocetirizine Dihydrochloride; Take 1 tablet (5 mg total) by mouth every evening.  Dispense: 15 tablet; Refill: 1 -     predniSONE; Take 1 tablet (10 mg total) by mouth daily with breakfast for 5 days.  Dispense: 5 tablet; Refill: 0 -     Azelastine-Fluticasone; Place 1 spray into the nose every 12 (twelve) hours.  Dispense: 23 g; Refill: 1 -     Albuterol Sulfate HFA; Inhale 2 puffs into the lungs every 6 (six) hours as needed for wheezing or shortness of breath.  Dispense: 8 g; Refill: 2 -     Benzonatate; Take 1 capsule (100 mg total) by mouth 2 (two) times daily as needed for cough.  Dispense: 20 capsule; Refill: 0    Return in about 6 weeks (around 03/11/2023) for Shoulder pain.   Renard Hamper Ria Comment, FNP

## 2023-01-28 NOTE — Patient Instructions (Signed)
It was pleasure meeting with you today. Please take medications as prescribed. Follow up with your primary health provider if any health concerns arises. If symptoms worsen please contact your primary care provider and/or visit the emergency department.  

## 2023-03-11 ENCOUNTER — Ambulatory Visit: Payer: BC Managed Care – PPO | Admitting: Family Medicine

## 2023-03-11 ENCOUNTER — Telehealth: Payer: BC Managed Care – PPO | Admitting: Physician Assistant

## 2023-03-11 DIAGNOSIS — B9689 Other specified bacterial agents as the cause of diseases classified elsewhere: Secondary | ICD-10-CM | POA: Diagnosis not present

## 2023-03-11 DIAGNOSIS — N760 Acute vaginitis: Secondary | ICD-10-CM

## 2023-03-11 MED ORDER — METRONIDAZOLE 500 MG PO TABS: 500.0000 mg | ORAL_TABLET | Freq: Two times a day (BID) | ORAL | 0 refills | Status: AC

## 2023-03-11 NOTE — Progress Notes (Signed)
E-Visit for Vaginal Symptoms  We are sorry that you are not feeling well. Here is how we plan to help! Based on what you shared with me it looks like you: May have a vaginosis due to bacteria  Vaginosis is an inflammation of the vagina that can result in discharge, itching and pain. The cause is usually a change in the normal balance of vaginal bacteria or an infection. Vaginosis can also result from reduced estrogen levels after menopause.  The most common causes of vaginosis are:   Bacterial vaginosis which results from an overgrowth of one on several organisms that are normally present in your vagina.   Yeast infections which are caused by a naturally occurring fungus called candida.   Vaginal atrophy (atrophic vaginosis) which results from the thinning of the vagina from reduced estrogen levels after menopause.   Trichomoniasis which is caused by a parasite and is commonly transmitted by sexual intercourse.  Factors that increase your risk of developing vaginosis include: Medications, such as antibiotics and steroids Uncontrolled diabetes Use of hygiene products such as bubble bath, vaginal spray or vaginal deodorant Douching Wearing damp or tight-fitting clothing Using an intrauterine device (IUD) for birth control Hormonal changes, such as those associated with pregnancy, birth control pills or menopause Sexual activity Having a sexually transmitted infection  Your treatment plan is Metronidazole or Flagyl 500mg twice a day for 7 days.  I have electronically sent this prescription into the pharmacy that you have chosen.  Be sure to take all of the medication as directed. Stop taking any medication if you develop a rash, tongue swelling or shortness of breath. Mothers who are breast feeding should consider pumping and discarding their breast milk while on these antibiotics. However, there is no consensus that infant exposure at these doses would be harmful.  Remember that  medication creams can weaken latex condoms. .   HOME CARE:  Good hygiene may prevent some types of vaginosis from recurring and may relieve some symptoms:  Avoid baths, hot tubs and whirlpool spas. Rinse soap from your outer genital area after a shower, and dry the area well to prevent irritation. Don't use scented or harsh soaps, such as those with deodorant or antibacterial action. Avoid irritants. These include scented tampons and pads. Wipe from front to back after using the toilet. Doing so avoids spreading fecal bacteria to your vagina.  Other things that may help prevent vaginosis include:  Don't douche. Your vagina doesn't require cleansing other than normal bathing. Repetitive douching disrupts the normal organisms that reside in the vagina and can actually increase your risk of vaginal infection. Douching won't clear up a vaginal infection. Use a latex condom. Both female and female latex condoms may help you avoid infections spread by sexual contact. Wear cotton underwear. Also wear pantyhose with a cotton crotch. If you feel comfortable without it, skip wearing underwear to bed. Yeast thrives in moist environments Your symptoms should improve in the next day or two.  GET HELP RIGHT AWAY IF:  You have pain in your lower abdomen ( pelvic area or over your ovaries) You develop nausea or vomiting You develop a fever Your discharge changes or worsens You have persistent pain with intercourse You develop shortness of breath, a rapid pulse, or you faint.  These symptoms could be signs of problems or infections that need to be evaluated by a medical provider now.  MAKE SURE YOU   Understand these instructions. Will watch your condition. Will get help right   away if you are not doing well or get worse.  Thank you for choosing an e-visit.  Your e-visit answers were reviewed by a board certified advanced clinical practitioner to complete your personal care plan. Depending upon the  condition, your plan could have included both over the counter or prescription medications.  Please review your pharmacy choice. Make sure the pharmacy is open so you can pick up prescription now. If there is a problem, you may contact your provider through MyChart messaging and have the prescription routed to another pharmacy.  Your safety is important to us. If you have drug allergies check your prescription carefully.   For the next 24 hours you can use MyChart to ask questions about today's visit, request a non-urgent call back, or ask for a work or school excuse. You will get an email in the next two days asking about your experience. I hope that your e-visit has been valuable and will speed your recovery.  I have spent 5 minutes in review of e-visit questionnaire, review and updating patient chart, medical decision making and response to patient.   Lajeana Strough M Arney Mayabb, PA-C  

## 2023-03-28 ENCOUNTER — Ambulatory Visit (INDEPENDENT_AMBULATORY_CARE_PROVIDER_SITE_OTHER): Payer: BC Managed Care – PPO | Admitting: Adult Health

## 2023-03-28 ENCOUNTER — Encounter: Payer: Self-pay | Admitting: Adult Health

## 2023-03-28 VITALS — BP 124/77 | HR 74 | Ht 67.0 in | Wt 158.0 lb

## 2023-03-28 DIAGNOSIS — Z01419 Encounter for gynecological examination (general) (routine) without abnormal findings: Secondary | ICD-10-CM | POA: Diagnosis not present

## 2023-03-28 DIAGNOSIS — Z3041 Encounter for surveillance of contraceptive pills: Secondary | ICD-10-CM | POA: Diagnosis not present

## 2023-03-28 MED ORDER — NORETHIN ACE-ETH ESTRAD-FE 1-20 MG-MCG(24) PO TABS: 1.0000 | ORAL_TABLET | Freq: Every day | ORAL | 4 refills | Status: DC

## 2023-03-28 NOTE — Progress Notes (Signed)
Patient ID: [name], female   DOB: July 27, 1991, 32 y.o.   MRN: [id number] History of Present Illness: Dejanira is a 32 year old white female,single, G2P2002, in for a well woman gyn exam. She has no complaints.  Last pap was negative HPV, NILM 02/22/21.  PCP is I Polanco, NP    Current Medications, Allergies, Past Medical History, Past Surgical History, Family History and Social History were reviewed in [employer] record.     Review of Systems: Patient denies any headaches, hearing loss, fatigue, blurred vision, shortness of breath, chest pain, abdominal pain, problems with bowel movements, urination, or intercourse. No joint pain or mood swings.     Physical Exam:BP 124/77 (BP Location: Left Arm, Patient Position: Sitting, Cuff Size: Normal)   Pulse 74   Ht 5\' 7"  (1.702 m)   Wt 158 lb (71.7 kg)   LMP 02/28/2023 (Approximate)   BMI 24.75 kg/m   General:  Well developed, well nourished, no acute distress Skin:  Warm and dry Neck:  Midline trachea, normal thyroid, good ROM, no lymphadenopathy Lungs; Clear to auscultation bilaterally Breast:  No dominant palpable mass, retraction, or nipple discharge Cardiovascular: Regular rate and rhythm Abdomen:  Soft, non tender, no hepatosplenomegaly Pelvic:  External genitalia is normal in appearance, no lesions.  The vagina is normal in appearance. Urethra has no lesions or masses. The cervix is bulbous.  Uterus is felt to be normal size, shape, and contour.  No adnexal masses or tenderness noted.Bladder is non tender, no masses felt. Extremities/musculoskeletal:  No swelling or varicosities noted, no clubbing or cyanosis Psych:  No mood changes, alert and cooperative,seems happy AA is 1 Fall risk is low    03/28/2023    1:39 PM 01/28/2023    2:56 PM 02/28/2022   10:44 AM  Depression screen PHQ 2/9  Decreased Interest 1 0 1  Down, Depressed, Hopeless 0 0 1  PHQ - 2 Score 1 0 2  Altered sleeping 1 1 1   Tired,  decreased energy 1 2 2   Change in appetite 0 0 0  Feeling bad or failure about yourself  0 0 0  Trouble concentrating 0 0 0  Moving slowly or fidgety/restless 0 0 0  Suicidal thoughts 0 0 0  PHQ-9 Score 3 3 5   Difficult doing work/chores  Not difficult at all        03/28/2023    1:39 PM 01/28/2023    2:56 PM 02/28/2022   10:44 AM 07/05/2021    1:40 PM  GAD 7 : Generalized Anxiety Score  Nervous, Anxious, on Edge 0 0 1 1  Control/stop worrying 1 1 0 0  Worry too much - different things 1 0 2 1  Trouble relaxing 0 1 1 0  Restless 0 0 0 0  Easily annoyed or irritable 1 2 2 2   Afraid - awful might happen 0 0 0 0  Total GAD 7 Score 3 4 6 4   Anxiety Difficulty  Not difficult at all  Not difficult at all      [facility] - 03/28/23 1344       Pregnancy Intention Screening   Does the patient want to become pregnant in the next year? No    Does the patient's partner want to become pregnant in the next year? No    Would the patient like to discuss contraceptive options today? No      Contraception Wrap Up   Current Method Oral Contraceptive  End Method Oral Contraceptive            Examination chaperoned by Malachy Mood LPN   Impression and Plan: 1. Encounter for well woman exam with routine gynecological exam Pap and physical in 1 year Labs with PCP Stay active   2. Encounter for surveillance of contraceptive pills Happy with birth control pills, will refill Meds ordered this encounter  Medications   Norethindrone Acetate-Ethinyl Estrad-FE (LOESTRIN 24 FE) 1-20 MG-MCG(24) tablet    Sig: Take 1 tablet by mouth daily.    Dispense:  84 tablet    Refill:  4    Order Specific Question:   Supervising Provider    Answer:   Duane Lope H [[id number]]

## 2023-05-09 ENCOUNTER — Encounter (HOSPITAL_COMMUNITY): Payer: Self-pay

## 2023-05-09 ENCOUNTER — Emergency Department (HOSPITAL_COMMUNITY)
Admission: EM | Admit: 2023-05-09 | Discharge: 2023-05-09 | Disposition: A | Discharge status: Home / Self Care | Payer: BC Managed Care – PPO | Attending: Emergency Medicine | Admitting: Emergency Medicine

## 2023-05-09 DIAGNOSIS — K0889 Other specified disorders of teeth and supporting structures: Secondary | ICD-10-CM | POA: Insufficient documentation

## 2023-05-09 MED ORDER — PENICILLIN V POTASSIUM 250 MG PO TABS
500.0000 mg | ORAL_TABLET | Freq: Once | ORAL | Status: AC
  Administered 2023-05-09: 500 mg via ORAL
  Filled 2023-05-09: qty 2

## 2023-05-09 MED ORDER — PENICILLIN V POTASSIUM 500 MG PO TABS: 500.0000 mg | ORAL_TABLET | Freq: Four times a day (QID) | ORAL | 0 refills | Status: DC

## 2023-05-09 MED ORDER — OXYCODONE-ACETAMINOPHEN 5-325 MG PO TABS: 1.0000 | ORAL_TABLET | Freq: Three times a day (TID) | ORAL | 0 refills | Status: DC | PRN

## 2023-05-09 MED ORDER — IBUPROFEN 800 MG PO TABS: 800.0000 mg | ORAL_TABLET | Freq: Three times a day (TID) | ORAL | 0 refills | Status: DC

## 2023-05-09 MED ORDER — OXYCODONE-ACETAMINOPHEN 5-325 MG PO TABS
2.0000 | ORAL_TABLET | Freq: Once | ORAL | Status: AC
  Administered 2023-05-09: 2 via ORAL
  Filled 2023-05-09: qty 2

## 2023-05-09 MED ORDER — KETOROLAC TROMETHAMINE 60 MG/2ML IM SOLN
30.0000 mg | Freq: Once | INTRAMUSCULAR | Status: AC
  Administered 2023-05-09: 30 mg via INTRAMUSCULAR
  Filled 2023-05-09: qty 2

## 2023-05-09 NOTE — ED Triage Notes (Signed)
Pt states that she started having dental pain in  R upper cavity of mouth a few days ago. States that she had a dental filling in area that's causing pain a few months ago.

## 2023-05-09 NOTE — ED Provider Notes (Signed)
[place] EMERGENCY DEPARTMENT AT [facility] Provider Note   CSN: [id number] Arrival date & time: 05/09/23  8295     History  Chief Complaint  Patient presents with   Dental Pain    Tiffany Goodwin is a 32 y.o. female.  3-4 days of right upper incisor pain. Had recent filling but not able to get f/u with dentist as the whole office is on vacation apparently. No fevers. No drainage. No trauma.    Dental Pain      Home Medications Prior to Admission medications   Medication Sig Start Date End Date Taking? Authorizing Provider  ibuprofen (ADVIL) 800 MG tablet Take 1 tablet (800 mg total) by mouth 3 (three) times daily. 05/09/23  Yes Romeo Zielinski, Barbara Cower, MD  oxyCODONE-acetaminophen (PERCOCET/ROXICET) 5-325 MG tablet Take 1 tablet by mouth every 8 (eight) hours as needed for severe pain. 05/09/23  Yes Kamron Vanwyhe, Barbara Cower, MD  penicillin v potassium (VEETID) 500 MG tablet Take 1 tablet (500 mg total) by mouth 4 (four) times daily for 10 days. 05/09/23 05/19/23 Yes Dailon Sheeran, Barbara Cower, MD  albuterol (VENTOLIN HFA) 108 (90 Base) MCG/ACT inhaler Inhale 2 puffs into the lungs every 6 (six) hours as needed for wheezing or shortness of breath. 01/28/23   Del Nigel Berthold, FNP  atorvastatin (LIPITOR) 20 MG tablet Take 1 tablet (20 mg total) by mouth daily. 06/27/22   Paseda, Baird Kay, FNP  Norethindrone Acetate-Ethinyl Estrad-FE (LOESTRIN 24 FE) 1-20 MG-MCG(24) tablet Take 1 tablet by mouth daily. 03/28/23   Adline Potter, NP      Allergies    Patient has no known allergies.    Review of Systems   Review of Systems  Physical Exam Updated Vital Signs BP (!) 141/91   Pulse 83   Temp 97.7 F (36.5 C) (Oral)   Resp 18   Ht 5\' 7"  (1.702 m)   Wt 71.7 kg   LMP 04/25/2023 (Approximate)   SpO2 100%   BMI 24.75 kg/m  Physical Exam Vitals and nursing note reviewed.  Constitutional:      Appearance: She is well-developed.  HENT:     Head: Normocephalic and atraumatic.      Mouth/Throat:      Comments: Erythema and ttp, no fluctuance or abscess. No trismus or sublingual pain/woodiness.  Eyes:     Pupils: Pupils are equal, round, and reactive to light.  Cardiovascular:     Rate and Rhythm: Normal rate and regular rhythm.  Pulmonary:     Effort: No respiratory distress.     Breath sounds: No stridor.  Abdominal:     General: There is no distension.  Musculoskeletal:     Cervical back: Normal range of motion.  Neurological:     Mental Status: She is alert.     ED Results / Procedures / Treatments   Labs (all labs ordered are listed, but only abnormal results are displayed) Labs Reviewed - No data to display  EKG None  Radiology No results found.  Procedures Procedures    Medications Ordered in ED Medications  ketorolac (TORADOL) injection 30 mg (has no administration in time range)  oxyCODONE-acetaminophen (PERCOCET/ROXICET) 5-325 MG per tablet 2 tablet (has no administration in time range)  penicillin v potassium (VEETID) tablet 500 mg (has no administration in time range)    ED Course/ Medical Decision Making/ A&P  Medical Decision Making Risk Prescription drug management.   Dental pain. Likely post-op vs more likely infection. Nbo e/o deep neck space infection, abscess or other complications. Abx started. Resource guide provided.   Final Clinical Impression(s) / ED Diagnoses Final diagnoses:  Pain, dental    Rx / DC Orders ED Discharge Orders          Ordered    oxyCODONE-acetaminophen (PERCOCET/ROXICET) 5-325 MG tablet  Every 8 hours PRN        05/09/23 0405    ibuprofen (ADVIL) 800 MG tablet  3 times daily        05/09/23 0405    penicillin v potassium (VEETID) 500 MG tablet  4 times daily        05/09/23 0405              Ashlynn Gunnels, Barbara Cower, MD 05/09/23 0410

## 2023-05-10 MED FILL — Oxycodone w/ Acetaminophen Tab 5-325 MG: ORAL | Qty: 6 | Status: AC

## 2023-05-14 ENCOUNTER — Telehealth: Payer: Self-pay | Admitting: Family Medicine

## 2023-05-14 NOTE — Telephone Encounter (Signed)
Patient called has a allergic reaction to penicillin  broken out on stomach and back. Does patient need to take benadryl until then? Appointment made with Dr Allena Katz 06.26.2024.patient asked for a call back [phone].

## 2023-05-15 ENCOUNTER — Encounter: Payer: Self-pay | Admitting: Internal Medicine

## 2023-05-15 ENCOUNTER — Ambulatory Visit (INDEPENDENT_AMBULATORY_CARE_PROVIDER_SITE_OTHER): Payer: BC Managed Care – PPO | Admitting: Internal Medicine

## 2023-05-15 VITALS — BP 121/75 | HR 78 | Ht 67.0 in | Wt 153.0 lb

## 2023-05-15 DIAGNOSIS — T7840XA Allergy, unspecified, initial encounter: Secondary | ICD-10-CM

## 2023-05-15 DIAGNOSIS — R21 Rash and other nonspecific skin eruption: Secondary | ICD-10-CM

## 2023-05-15 MED ORDER — HYDROXYZINE HCL 10 MG PO TABS
10.0000 mg | ORAL_TABLET | Freq: Three times a day (TID) | ORAL | 0 refills | Status: DC | PRN
Start: 2023-05-15 — End: 2024-01-30

## 2023-05-15 MED ORDER — TRIAMCINOLONE ACETONIDE 0.1 % EX CREA
1.0000 | TOPICAL_CREAM | Freq: Two times a day (BID) | CUTANEOUS | 0 refills | Status: DC
Start: 2023-05-15 — End: 2023-10-09

## 2023-05-15 NOTE — Patient Instructions (Signed)
Please start taking Hydroxyzine as needed for itching rash.  Please apply Kenalog cream over the itching sites.

## 2023-05-15 NOTE — Assessment & Plan Note (Addendum)
Unclear if related to penicillin exposure or other etiology Hydroxyzine as needed for itching Kenalog as needed for itching/rash Has been switched to clindamycin by her dental provider's Consider checking penicillin antibody panel later

## 2023-05-15 NOTE — Telephone Encounter (Signed)
Patient has appointment today with Dr. Patel

## 2023-05-15 NOTE — Progress Notes (Signed)
Acute Office Visit  Subjective:    Patient ID: Tiffany Goodwin, female    DOB: 01/02/1991, 32 y.o.   MRN: [id number]  Chief Complaint  Patient presents with   Allergic Reaction    Patient is concerned that she had an allergic reaction to penicillin     HPI Patient is in today for complete complaint of itching rash over her neck and abdominal wall area.  Of note, she was recently prescribed penicillin for dental abscess.  Her last dose of penicillin was about 2 days ago and she took it for about 3 days.  She denies any lip swelling, dyspnea or wheezing currently.  She does not recall taking penicillin or other penicillin derivative antibiotics in the past.  Past Medical History:  Diagnosis Date   Abnormal Pap smear of cervix 04/03/2018   03/31/18: ASCUS/HPV   Colpo___   Anxiety    past   BV (bacterial vaginosis) 10/21/2014   Itching in the vaginal area 12/07/2015   LLQ pain 04/19/2015   Missed period 04/19/2015   Patient desires pregnancy 12/07/2015   Pelvic pain in female 11/18/2014   Pneumonia 2016, january; 12/2018   left side   Screening for STD (sexually transmitted disease) 01/20/2015   Spider vein of right lower extremity 09/04/2017   Vaginal discharge 10/21/2014    Past Surgical History:  Procedure Laterality Date   TONSILLECTOMY     WISDOM TOOTH EXTRACTION     WISDOM TOOTH EXTRACTION      Family History  Problem Relation Age of Onset   Heart attack Maternal Grandmother    Stroke Maternal Grandmother    Dementia Maternal Grandmother    Other Paternal Grandfather        brain aneursym   Cancer Maternal Grandfather        bone,chest,liver,brain,pancreatic,throat   Anesthesia problems Neg Hx    Hypotension Neg Hx    Malignant hyperthermia Neg Hx    Pseudochol deficiency Neg Hx     Social History   Socioeconomic History   Marital status: Single    Spouse name: Not on file   Number of children: Not on file   Years of education: Not on file   Highest education  level: Not on file  Occupational History   Not on file  Tobacco Use   Smoking status: Never   Smokeless tobacco: Never  Vaping Use   Vaping Use: Never used  Substance and Sexual Activity   Alcohol use: Yes    Comment: occ   Drug use: No   Sexual activity: Yes    Birth control/protection: Pill  Other Topics Concern   Not on file  Social History Narrative   Lives with Tiffany Goodwin and her two children      Tiffany Goodwin- 57 years old (2020) birthday in [birth year]   Tiffany Goodwin-1 years (2020) [birth year]      Eats: veggies, meats, sweets, processed foods   Drink: Does not drink a lot of water. Sodas, OJ     Caffeine: 2-3 sodas      Wears sunscreen and seat belts.   Both smoke and carbon detectors in the home-checks batteries frequently.    Social Determinants of Health   Financial Resource Strain: Low Risk  (03/28/2023)   Overall Financial Resource Strain (CARDIA)    Difficulty of Paying Living Expenses: Not hard at all  Food Insecurity: No Food Insecurity (03/28/2023)   Hunger Vital Sign    Worried About Programme researcher, broadcasting/film/video in  the Last Year: Never true    Ran Out of Food in the Last Year: Never true  Transportation Needs: No Transportation Needs (03/28/2023)   PRAPARE - Administrator, Civil Service (Medical): No    Lack of Transportation (Non-Medical): No  Physical Activity: Insufficiently Active (03/28/2023)   Exercise Vital Sign    Days of Exercise per Week: 3 days    Minutes of Exercise per Session: 30 min  Stress: No Stress Concern Present (03/28/2023)   [employer] of Occupational Health - Occupational Stress Questionnaire    Feeling of Stress : Only a little  Social Connections: Socially Isolated (03/28/2023)   Social Connection and Isolation Panel [NHANES]    Frequency of Communication with Friends and Family: Three times a week    Frequency of Social Gatherings with Friends and Family: Twice a week    Attends Religious Services: Never    Database administrator or  Organizations: No    Attends Banker Meetings: Never    Marital Status: Never married  Intimate Partner Violence: Not At Risk (03/28/2023)   Humiliation, Afraid, Rape, and Kick questionnaire    Fear of Current or Ex-Partner: No    Emotionally Abused: No    Physically Abused: No    Sexually Abused: No    Outpatient Medications Prior to Visit  Medication Sig Dispense Refill   albuterol (VENTOLIN HFA) 108 (90 Base) MCG/ACT inhaler Inhale 2 puffs into the lungs every 6 (six) hours as needed for wheezing or shortness of breath. 8 g 2   atorvastatin (LIPITOR) 20 MG tablet Take 1 tablet (20 mg total) by mouth daily. 90 tablet 3   ibuprofen (ADVIL) 800 MG tablet Take 1 tablet (800 mg total) by mouth 3 (three) times daily. 21 tablet 0   Norethindrone Acetate-Ethinyl Estrad-FE (LOESTRIN 24 FE) 1-20 MG-MCG(24) tablet Take 1 tablet by mouth daily. 84 tablet 4   oxyCODONE-acetaminophen (PERCOCET/ROXICET) 5-325 MG tablet Take 1 tablet by mouth every 8 (eight) hours as needed for severe pain. 6 tablet 0   penicillin v potassium (VEETID) 500 MG tablet Take 1 tablet (500 mg total) by mouth 4 (four) times daily for 10 days. 40 tablet 0   No facility-administered medications prior to visit.    No Known Allergies  Review of Systems  Constitutional:  Negative for chills and fever.  HENT:  Negative for congestion, postnasal drip, rhinorrhea, sinus pressure and sinus pain.   Respiratory:  Negative for cough, shortness of breath and wheezing.   Cardiovascular:  Negative for chest pain and palpitations.  Gastrointestinal:  Negative for nausea and vomiting.  Musculoskeletal:  Negative for neck pain and neck stiffness.  Skin:  Positive for rash.  Neurological:  Negative for dizziness and weakness.  Psychiatric/Behavioral:  Negative for agitation and behavioral problems.        Objective:    Physical Exam Vitals reviewed.  Constitutional:      General: She is not in acute distress.     Appearance: She is not diaphoretic.  HENT:     Head: Normocephalic and atraumatic.     Nose: Nose normal.     Mouth/Throat:     Mouth: Mucous membranes are moist.  Eyes:     General: No scleral icterus.    Extraocular Movements: Extraocular movements intact.  Cardiovascular:     Rate and Rhythm: Normal rate and regular rhythm.     Heart sounds: Normal heart sounds. No murmur heard. Pulmonary:  Breath sounds: Normal breath sounds. No wheezing or rales.  Musculoskeletal:     Right lower leg: No edema.     Left lower leg: No edema.  Skin:    General: Skin is warm.     Findings: Rash (Papules over erythematous base-lower part of neck and abdominal wall) present.  Neurological:     General: No focal deficit present.     Mental Status: She is alert and oriented to person, place, and time.  Psychiatric:        Mood and Affect: Mood normal.        Behavior: Behavior normal.     BP 121/75 (BP Location: Right Arm, Patient Position: Sitting, Cuff Size: Normal)   Pulse 78   Ht 5\' 7"  (1.702 m)   Wt 153 lb (69.4 kg)   LMP 04/25/2023 (Approximate)   SpO2 96%   BMI 23.96 kg/m  Wt Readings from Last 3 Encounters:  05/15/23 153 lb (69.4 kg)  05/09/23 158 lb (71.7 kg)  03/28/23 158 lb (71.7 kg)        Assessment & Plan:   Problem List Items Addressed This Visit       Other   Allergic reaction - Primary    Unclear if related to penicillin exposure or other etiology Hydroxyzine as needed for itching Kenalog as needed for itching/rash Has been switched to clindamycin by her dental provider's Consider checking penicillin antibody panel later      Relevant Medications   hydrOXYzine (ATARAX) 10 MG tablet   triamcinolone cream (KENALOG) 0.1 %     Meds ordered this encounter  Medications   hydrOXYzine (ATARAX) 10 MG tablet    Sig: Take 1 tablet (10 mg total) by mouth 3 (three) times daily as needed for itching.    Dispense:  30 tablet    Refill:  0   triamcinolone cream  (KENALOG) 0.1 %    Sig: Apply 1 Application topically 2 (two) times daily.    Dispense:  30 g    Refill:  0     Arcadia Gorgas Concha Se, MD

## 2023-06-05 ENCOUNTER — Other Ambulatory Visit: Payer: Self-pay

## 2023-06-06 DIAGNOSIS — Z0120 Encounter for dental examination and cleaning without abnormal findings: Secondary | ICD-10-CM | POA: Diagnosis not present

## 2023-06-07 ENCOUNTER — Encounter: Payer: BC Managed Care – PPO | Admitting: Family Medicine

## 2023-06-10 ENCOUNTER — Other Ambulatory Visit: Payer: BC Managed Care – PPO

## 2023-06-12 ENCOUNTER — Other Ambulatory Visit (INDEPENDENT_AMBULATORY_CARE_PROVIDER_SITE_OTHER): Payer: BC Managed Care – PPO

## 2023-06-12 ENCOUNTER — Other Ambulatory Visit (HOSPITAL_COMMUNITY)
Admission: RE | Admit: 2023-06-12 | Discharge: 2023-06-12 | Disposition: A | Discharge status: Home / Self Care | Payer: BC Managed Care – PPO | Source: Ambulatory Visit | Attending: Obstetrics & Gynecology | Admitting: Obstetrics & Gynecology

## 2023-06-12 DIAGNOSIS — N898 Other specified noninflammatory disorders of vagina: Secondary | ICD-10-CM | POA: Insufficient documentation

## 2023-06-12 DIAGNOSIS — Z113 Encounter for screening for infections with a predominantly sexual mode of transmission: Secondary | ICD-10-CM | POA: Diagnosis present

## 2023-06-12 NOTE — Progress Notes (Signed)
   NURSE VISIT- VAGINITIS/STD  SUBJECTIVE:  Tiffany Goodwin is a 32 y.o. [id number] GYN patientfemale here for a vaginal swab for vaginitis screening.  She reports the following symptoms: discharge described as white. Denies abnormal vaginal bleeding, significant pelvic pain, fever, or UTI symptoms.  OBJECTIVE:  There were no vitals taken for this visit.  Appears well, in no apparent distress  ASSESSMENT: Vaginal swab for vaginitis screening  PLAN: Self-collected vaginal probe for Gonorrhea, Chlamydia, Trichomonas, Bacterial Vaginosis, Yeast sent to lab Treatment: to be determined once results are received Follow-up as needed if symptoms persist/worsen, or new symptoms develop  Jobe Marker  06/12/2023 9:56 AM

## 2023-06-13 LAB — CERVICOVAGINAL ANCILLARY ONLY
Bacterial Vaginitis (gardnerella): NEGATIVE
Candida Glabrata: NEGATIVE
Candida Vaginitis: NEGATIVE
Chlamydia: NEGATIVE
Comment: NEGATIVE
Comment: NEGATIVE
Comment: NEGATIVE
Comment: NEGATIVE
Comment: NORMAL
Trichomonas: NEGATIVE

## 2023-06-18 ENCOUNTER — Other Ambulatory Visit: Payer: Self-pay | Admitting: Nurse Practitioner

## 2023-06-18 DIAGNOSIS — E7800 Pure hypercholesterolemia, unspecified: Secondary | ICD-10-CM

## 2023-06-21 ENCOUNTER — Other Ambulatory Visit: Payer: Self-pay | Admitting: Nurse Practitioner

## 2023-06-21 DIAGNOSIS — E7800 Pure hypercholesterolemia, unspecified: Secondary | ICD-10-CM

## 2023-06-24 ENCOUNTER — Other Ambulatory Visit: Payer: Self-pay | Admitting: Nurse Practitioner

## 2023-06-24 DIAGNOSIS — E7800 Pure hypercholesterolemia, unspecified: Secondary | ICD-10-CM

## 2023-06-26 ENCOUNTER — Encounter: Payer: BC Managed Care – PPO | Admitting: Family Medicine

## 2023-06-27 ENCOUNTER — Telehealth: Payer: Self-pay | Admitting: Family Medicine

## 2023-06-27 NOTE — Telephone Encounter (Signed)
Patient called asked why was this denied?  atorvastatin (LIPITOR) 20 MG tablet  Contact patient back [phone].  Need refill  Pharmacy: [facility]

## 2023-06-27 NOTE — Telephone Encounter (Signed)
Patient called asking does she need to come in and have blood work done week before her appointment. No orders are entered. Contact patient at [phone] and let patient know either way.

## 2023-06-28 ENCOUNTER — Other Ambulatory Visit: Payer: Self-pay

## 2023-06-28 DIAGNOSIS — E7800 Pure hypercholesterolemia, unspecified: Secondary | ICD-10-CM

## 2023-06-28 MED ORDER — ATORVASTATIN CALCIUM 20 MG PO TABS
20.0000 mg | ORAL_TABLET | Freq: Every day | ORAL | 0 refills | Status: DC
Start: 2023-06-28 — End: 2023-07-25

## 2023-06-28 NOTE — Telephone Encounter (Signed)
Pt asking for lab orders advised she is scheduled on 08/29 for a cpe will get labs at that visit, also asked for a refill on atorvastatin refilled a 30 day supply with no refill on it until she follows up with PCP.

## 2023-06-28 NOTE — Telephone Encounter (Signed)
30 day supply refilled, has a f/u appt on 07/18/23 with [facility] for a cpe.

## 2023-07-18 ENCOUNTER — Encounter: Payer: Self-pay | Admitting: Family Medicine

## 2023-07-18 ENCOUNTER — Ambulatory Visit (INDEPENDENT_AMBULATORY_CARE_PROVIDER_SITE_OTHER): Payer: BC Managed Care – PPO | Admitting: Family Medicine

## 2023-07-18 VITALS — BP 118/78 | HR 68 | Ht 67.0 in | Wt 151.1 lb

## 2023-07-18 DIAGNOSIS — E559 Vitamin D deficiency, unspecified: Secondary | ICD-10-CM | POA: Diagnosis not present

## 2023-07-18 DIAGNOSIS — Z0001 Encounter for general adult medical examination with abnormal findings: Secondary | ICD-10-CM

## 2023-07-18 DIAGNOSIS — Z13220 Encounter for screening for lipoid disorders: Secondary | ICD-10-CM

## 2023-07-18 DIAGNOSIS — Z131 Encounter for screening for diabetes mellitus: Secondary | ICD-10-CM

## 2023-07-18 DIAGNOSIS — Z1159 Encounter for screening for other viral diseases: Secondary | ICD-10-CM

## 2023-07-18 DIAGNOSIS — E538 Deficiency of other specified B group vitamins: Secondary | ICD-10-CM

## 2023-07-18 DIAGNOSIS — Z1329 Encounter for screening for other suspected endocrine disorder: Secondary | ICD-10-CM

## 2023-07-18 NOTE — Progress Notes (Signed)
Complete physical exam  Patient: Tiffany Goodwin   DOB: Aug 15, 1991   32 y.o. Female  MRN: [id number]  Subjective:    Chief Complaint  Patient presents with   Annual Exam    Annual physical, constant shoulder pain for the past 8 months.     Tiffany Goodwin is a 32 y.o. female who presents today for a complete physical exam. She reports consuming a general diet.  Patient reports walking 30 mins twice a week  She generally feels well. She reports sleeping okay sleep around 6 hours per night . She does have additional problems to discuss today.    Most recent fall risk assessment:    07/18/2023    3:44 PM  Fall Risk   Falls in the past year? 0     Most recent depression screenings:    07/18/2023    3:44 PM 05/15/2023    3:11 PM  PHQ 2/9 Scores  PHQ - 2 Score 1 1  PHQ- 9 Score 3     Vision:Within last year and Dental: No current dental problems and Receives regular dental care  Patient Care Team: Del Newman Nip, Tenna Child, FNP as PCP - General (Family Medicine)   Outpatient Medications Prior to Visit  Medication Sig   albuterol (VENTOLIN HFA) 108 (90 Base) MCG/ACT inhaler Inhale 2 puffs into the lungs every 6 (six) hours as needed for wheezing or shortness of breath.   atorvastatin (LIPITOR) 20 MG tablet Take 1 tablet (20 mg total) by mouth daily.   hydrOXYzine (ATARAX) 10 MG tablet Take 1 tablet (10 mg total) by mouth 3 (three) times daily as needed for itching.   ibuprofen (ADVIL) 800 MG tablet Take 1 tablet (800 mg total) by mouth 3 (three) times daily.   Norethindrone Acetate-Ethinyl Estrad-FE (LOESTRIN 24 FE) 1-20 MG-MCG(24) tablet Take 1 tablet by mouth daily.   oxyCODONE-acetaminophen (PERCOCET/ROXICET) 5-325 MG tablet Take 1 tablet by mouth every 8 (eight) hours as needed for severe pain.   triamcinolone cream (KENALOG) 0.1 % Apply 1 Application topically 2 (two) times daily.   No facility-administered medications prior to visit.    Review of Systems  Constitutional:   Negative for chills and fever.  HENT:  Negative for tinnitus.   Eyes:  Negative for blurred vision.  Respiratory:  Negative for shortness of breath.   Cardiovascular:  Negative for chest pain.  Gastrointestinal:  Negative for abdominal pain.  Genitourinary:  Negative for dysuria.  Musculoskeletal:  Negative for back pain.  Skin:  Negative for rash.  Neurological:  Negative for dizziness and headaches.  Psychiatric/Behavioral:  The patient is not nervous/anxious.        Objective:    BP 118/78 (BP Location: Right Arm, Patient Position: Sitting, Cuff Size: Normal)   Pulse 68   Ht 5\' 7"  (1.702 m)   Wt 151 lb 1.9 oz (68.5 kg)   SpO2 97%   BMI 23.67 kg/m  BP Readings from Last 3 Encounters:  07/18/23 118/78  05/15/23 121/75  05/09/23 (!) 138/96      Physical Exam Vitals reviewed.  Constitutional:      General: She is not in acute distress.    Appearance: Normal appearance. She is not ill-appearing, toxic-appearing or diaphoretic.  HENT:     Head: Normocephalic.     Right Ear: Tympanic membrane normal.     Left Ear: Tympanic membrane normal.     Nose: Nose normal.     Mouth/Throat:     Mouth:  Mucous membranes are moist.  Eyes:     General:        Right eye: No discharge.        Left eye: No discharge.     Conjunctiva/sclera: Conjunctivae normal.     Pupils: Pupils are equal, round, and reactive to light.  Cardiovascular:     Rate and Rhythm: Normal rate.     Pulses: Normal pulses.     Heart sounds: Normal heart sounds.  Pulmonary:     Effort: Pulmonary effort is normal. No respiratory distress.     Breath sounds: Normal breath sounds.  Abdominal:     General: Bowel sounds are normal.     Palpations: Abdomen is soft.     Tenderness: There is no abdominal tenderness. There is no right CVA tenderness, left CVA tenderness or guarding.  Musculoskeletal:        General: Normal range of motion.     Cervical back: Normal range of motion.  Skin:    General: Skin is  warm and dry.  Neurological:     General: No focal deficit present.     Mental Status: She is alert.     Coordination: Coordination normal.     Gait: Gait normal.  Psychiatric:        Mood and Affect: Mood normal.        Behavior: Behavior normal.      No results found for any visits on 07/18/23.    Assessment & Plan:    Routine Health Maintenance and Physical Exam  Immunization History  Administered Date(s) Administered   Tdap 11/27/2011    Health Maintenance  Topic Date Due   Hepatitis C Screening  Never done   DTaP/Tdap/Td (2 - Td or Tdap) 11/26/2021   COVID-19 Vaccine (1 - 2023-24 season) Never done   INFLUENZA VACCINE  06/20/2023   PAP SMEAR-Modifier  02/23/2024   HIV Screening  Completed   HPV VACCINES  Aged Out    Discussed health benefits of physical activity, and encouraged her to engage in regular exercise appropriate for her age and condition.  Screening for diabetes mellitus -     Microalbumin / creatinine urine ratio -     Hemoglobin A1c  Need for hepatitis C screening test -     Hepatitis C antibody  Vitamin D deficiency -     VITAMIN D 25 Hydroxy (Vit-D Deficiency, Fractures)  Screening for thyroid disorder -     TSH + free T4  Screening for lipid disorders -     Lipid panel -     CMP14+EGFR -     CBC with Differential/Platelet  Vitamin B12 deficiency -     Vitamin B12  Encounter for routine adult physical exam with abnormal findings Assessment & Plan: Physical exam done, labs ordered  Updated screening and health maintenance  Exercise and nutrition counseling Advise lifestyle modifications follow diet low in saturated fat, reduce dietary salt intake, avoid fatty foods, maintain an exercise routine 3 to 5 days a week for a minimum total of 150 minutes.      Return in about 6 months (around 01/17/2024), or if symptoms worsen or fail to improve, for hyperlipidemia.     Cruzita Lederer Newman Nip, FNP

## 2023-07-18 NOTE — Patient Instructions (Signed)

## 2023-07-18 NOTE — Assessment & Plan Note (Signed)
Physical exam done, labs ordered  Updated screening and health maintenance  Exercise and nutrition counseling Advise lifestyle modifications follow diet low in saturated fat, reduce dietary salt intake, avoid fatty foods, maintain an exercise routine 3 to 5 days a week for a minimum total of 150 minutes.

## 2023-07-18 NOTE — Progress Notes (Deleted)
   New Patient Office Visit   Subjective   Patient ID: Tiffany Goodwin, female    DOB: [birth year]  Age: 32 y.o. MRN: [id number]  CC:  Chief Complaint  Patient presents with   Annual Exam    Annual physical, constant shoulder pain for the past 8 months.     HPI [facility] presents to establish care. She  has a past medical history of Abnormal Pap smear of cervix (04/03/2018), Anxiety, BV (bacterial vaginosis) (10/21/2014), Itching in the vaginal area (12/07/2015), LLQ pain (04/19/2015), Missed period (04/19/2015), Patient desires pregnancy (12/07/2015), Pelvic pain in female (11/18/2014), Pneumonia (2016, january; 12/2018), Screening for STD (sexually transmitted disease) (01/20/2015), Spider vein of right lower extremity (09/04/2017), and Vaginal discharge (10/21/2014).  HPI  ***  Outpatient Encounter Medications as of 07/18/2023  Medication Sig   albuterol (VENTOLIN HFA) 108 (90 Base) MCG/ACT inhaler Inhale 2 puffs into the lungs every 6 (six) hours as needed for wheezing or shortness of breath.   atorvastatin (LIPITOR) 20 MG tablet Take 1 tablet (20 mg total) by mouth daily.   hydrOXYzine (ATARAX) 10 MG tablet Take 1 tablet (10 mg total) by mouth 3 (three) times daily as needed for itching.   ibuprofen (ADVIL) 800 MG tablet Take 1 tablet (800 mg total) by mouth 3 (three) times daily.   Norethindrone Acetate-Ethinyl Estrad-FE (LOESTRIN 24 FE) 1-20 MG-MCG(24) tablet Take 1 tablet by mouth daily.   oxyCODONE-acetaminophen (PERCOCET/ROXICET) 5-325 MG tablet Take 1 tablet by mouth every 8 (eight) hours as needed for severe pain.   triamcinolone cream (KENALOG) 0.1 % Apply 1 Application topically 2 (two) times daily.   No facility-administered encounter medications on file as of 07/18/2023.    Past Surgical History:  Procedure Laterality Date   TONSILLECTOMY     WISDOM TOOTH EXTRACTION     WISDOM TOOTH EXTRACTION      ROS    Objective    BP 118/78 (BP Location: Right Arm, Patient Position:  Sitting, Cuff Size: Normal)   Pulse 68   Ht 5\' 7"  (1.702 m)   Wt 151 lb 1.9 oz (68.5 kg)   SpO2 97%   BMI 23.67 kg/m   Physical Exam    Assessment & Plan:  There are no diagnoses linked to this encounter.  No follow-ups on file.   Cruzita Lederer Newman Nip, FNP

## 2023-07-24 DIAGNOSIS — E538 Deficiency of other specified B group vitamins: Secondary | ICD-10-CM | POA: Diagnosis not present

## 2023-07-24 DIAGNOSIS — E559 Vitamin D deficiency, unspecified: Secondary | ICD-10-CM | POA: Diagnosis not present

## 2023-07-24 DIAGNOSIS — Z131 Encounter for screening for diabetes mellitus: Secondary | ICD-10-CM | POA: Diagnosis not present

## 2023-07-24 DIAGNOSIS — Z1159 Encounter for screening for other viral diseases: Secondary | ICD-10-CM | POA: Diagnosis not present

## 2023-07-24 DIAGNOSIS — Z13220 Encounter for screening for lipoid disorders: Secondary | ICD-10-CM | POA: Diagnosis not present

## 2023-07-24 DIAGNOSIS — Z1329 Encounter for screening for other suspected endocrine disorder: Secondary | ICD-10-CM | POA: Diagnosis not present

## 2023-07-25 ENCOUNTER — Other Ambulatory Visit: Payer: Self-pay | Admitting: Family Medicine

## 2023-07-25 MED ORDER — ATORVASTATIN CALCIUM 10 MG PO TABS
10.0000 mg | ORAL_TABLET | Freq: Every day | ORAL | 1 refills | Status: DC
Start: 2023-07-25 — End: 2023-07-25

## 2023-07-26 LAB — CMP14+EGFR
ALT: 17 IU/L (ref 0–32)
AST: 17 IU/L (ref 0–40)
Albumin: 4.4 g/dL (ref 3.9–4.9)
Alkaline Phosphatase: 70 IU/L (ref 44–121)
BUN/Creatinine Ratio: 18 (ref 9–23)
BUN: 12 mg/dL (ref 6–20)
Bilirubin Total: 0.7 mg/dL (ref 0.0–1.2)
CO2: 23 mmol/L (ref 20–29)
Calcium: 9.4 mg/dL (ref 8.7–10.2)
Chloride: 103 mmol/L (ref 96–106)
Creatinine, Ser: 0.67 mg/dL (ref 0.57–1.00)
Globulin, Total: 2.8 g/dL (ref 1.5–4.5)
Glucose: 91 mg/dL (ref 70–99)
Potassium: 4.2 mmol/L (ref 3.5–5.2)
Sodium: 139 mmol/L (ref 134–144)
Total Protein: 7.2 g/dL (ref 6.0–8.5)
eGFR: 119 mL/min/{1.73_m2} (ref >59)

## 2023-07-26 LAB — CBC WITH DIFFERENTIAL/PLATELET
Basophils Absolute: 0.1 10*3/uL (ref 0.0–0.2)
Basos: 1 %
EOS (ABSOLUTE): 0.1 10*3/uL (ref 0.0–0.4)
Eos: 1 %
Hematocrit: 45 % (ref 34.0–46.6)
Hemoglobin: 14.4 g/dL (ref 11.1–15.9)
Immature Grans (Abs): 0 10*3/uL (ref 0.0–0.1)
Immature Granulocytes: 0 %
Lymphocytes Absolute: 2 10*3/uL (ref 0.7–3.1)
Lymphs: 25 %
MCH: 31.9 pg (ref 26.6–33.0)
MCHC: 32 g/dL (ref 31.5–35.7)
MCV: 100 fL — ABNORMAL HIGH (ref 79–97)
Monocytes Absolute: 0.5 10*3/uL (ref 0.1–0.9)
Monocytes: 6 %
Neutrophils Absolute: 5.3 10*3/uL (ref 1.4–7.0)
Neutrophils: 67 %
Platelets: 359 10*3/uL (ref 150–450)
RBC: 4.52 x10E6/uL (ref 3.77–5.28)
RDW: 11.9 % (ref 11.7–15.4)
WBC: 8 10*3/uL (ref 3.4–10.8)

## 2023-07-26 LAB — HEMOGLOBIN A1C
Est. average glucose Bld gHb Est-mCnc: 117 mg/dL
Hgb A1c MFr Bld: 5.7 % — ABNORMAL HIGH (ref 4.8–5.6)

## 2023-07-26 LAB — VITAMIN D 25 HYDROXY (VIT D DEFICIENCY, FRACTURES): Vit D, 25-Hydroxy: 52.9 ng/mL (ref 30.0–100.0)

## 2023-07-26 LAB — LIPID PANEL
Chol/HDL Ratio: 2.4 ratio (ref 0.0–4.4)
Cholesterol, Total: 148 mg/dL (ref 100–199)
HDL: 61 mg/dL (ref >39)
LDL Chol Calc (NIH): 72 mg/dL (ref 0–99)
Triglycerides: 78 mg/dL (ref 0–149)
VLDL Cholesterol Cal: 15 mg/dL (ref 5–40)

## 2023-07-26 LAB — VITAMIN B12: Vitamin B-12: 437 pg/mL (ref 232–1245)

## 2023-07-26 LAB — MICROALBUMIN / CREATININE URINE RATIO
Creatinine, Urine: 120.9 mg/dL
Microalb/Creat Ratio: 10 mg/g{creat} (ref 0–29)
Microalbumin, Urine: 12.6 ug/mL

## 2023-07-26 LAB — TSH+FREE T4
Free T4: 0.83 ng/dL (ref 0.82–1.77)
TSH: 1.04 u[IU]/mL (ref 0.450–4.500)

## 2023-07-26 LAB — HEPATITIS C ANTIBODY: Hep C Virus Ab: NONREACTIVE

## 2023-07-29 ENCOUNTER — Encounter: Payer: Self-pay | Admitting: Family Medicine

## 2023-10-09 ENCOUNTER — Encounter: Payer: Self-pay | Admitting: Adult Health

## 2023-10-09 ENCOUNTER — Other Ambulatory Visit (HOSPITAL_COMMUNITY)
Admission: RE | Admit: 2023-10-09 | Discharge: 2023-10-09 | Disposition: A | Discharge status: Home / Self Care | Payer: BC Managed Care – PPO | Source: Ambulatory Visit | Attending: Adult Health | Admitting: Adult Health

## 2023-10-09 ENCOUNTER — Ambulatory Visit: Payer: BC Managed Care – PPO | Admitting: Adult Health

## 2023-10-09 VITALS — BP 137/82 | HR 79 | Ht 67.0 in | Wt 153.0 lb

## 2023-10-09 DIAGNOSIS — Z113 Encounter for screening for infections with a predominantly sexual mode of transmission: Secondary | ICD-10-CM

## 2023-10-09 DIAGNOSIS — N898 Other specified noninflammatory disorders of vagina: Secondary | ICD-10-CM | POA: Insufficient documentation

## 2023-10-09 DIAGNOSIS — N760 Acute vaginitis: Secondary | ICD-10-CM | POA: Insufficient documentation

## 2023-10-09 DIAGNOSIS — B9689 Other specified bacterial agents as the cause of diseases classified elsewhere: Secondary | ICD-10-CM | POA: Insufficient documentation

## 2023-10-09 MED ORDER — METRONIDAZOLE 500 MG PO TABS: 500.0000 mg | ORAL_TABLET | Freq: Two times a day (BID) | ORAL | 0 refills | Status: DC

## 2023-10-09 NOTE — Progress Notes (Signed)
  Subjective:     Patient ID: Tiffany Goodwin, female   DOB: [birth year], 32 y.o.   MRN: [id number]  HPI Tiffany Goodwin is a 32 year old white female,single, G2P2002, in requesting STD testing, has noticed odor.     Component Value Date/Time   DIAGPAP  02/22/2021 0935    - Negative for Intraepithelial Lesions or Malignancy (NILM)   DIAGPAP - Benign reactive/reparative changes 02/22/2021 0935   DIAGPAP (A) 02/17/2020 0958    - Atypical squamous cells of undetermined significance (ASC-US)   HPVHIGH Negative 02/22/2021 0935   HPVHIGH Positive (A) 02/17/2020 0958   ADEQPAP  02/22/2021 0935    Satisfactory for evaluation; transformation zone component PRESENT.   ADEQPAP  02/17/2020 0958    Satisfactory for evaluation; transformation zone component PRESENT.   ADEQPAP (A) 03/31/2018 0000    Satisfactory for evaluation  endocervical/transformation zone component PRESENT.    PCP is I Polanco NP   Review of Systems +vaginal odor Reviewed past medical,surgical, social and family history. Reviewed medications and allergies.     Objective:   Physical Exam BP 137/82 (BP Location: Right Arm, Patient Position: Sitting, Cuff Size: Normal)   Pulse 79   Ht 5\' 7"  (1.702 m)   Wt 153 lb (69.4 kg)   LMP 09/14/2023   BMI 23.96 kg/m     Skin warm and dry.Pelvic: external genitalia is normal in appearance no lesions, vagina: white discharge with odor,urethra has no lesions or masses noted, cervix:smooth and bulbous, uterus: normal size, shape and contour, non tender, no masses felt, adnexa: no masses or tenderness noted. Bladder is non tender and no masses felt. CV swab obtained.  Upstream - 10/09/23 1402       Pregnancy Intention Screening   Does the patient want to become pregnant in the next year? No    Does the patient's partner want to become pregnant in the next year? No    Would the patient like to discuss contraceptive options today? No      Contraception Wrap Up   Current Method Oral Contraceptive     End Method Oral Contraceptive    Contraception Counseling Provided No            Examination chaperoned by Faith Rogue LPN  Assessment:     1. Screening examination for STD (sexually transmitted disease) CV swab sent for GC/CHL,trich,BV and yeast - Cervicovaginal ancillary only( [place]) She declines HIV and RPR  2. Vaginal discharge CV swab sent  - Cervicovaginal ancillary only( [place])  3. Vaginal odor + odor CV swab sent Will rx flagyl Meds ordered this encounter  Medications   metroNIDAZOLE (FLAGYL) 500 MG tablet    Sig: Take 1 tablet (500 mg total) by mouth 2 (two) times daily.    Dispense:  14 tablet    Refill:  0    Order Specific Question:   Supervising Provider    Answer:   Duane Lope H [[id number]]    - Cervicovaginal ancillary only( )     Plan:     Return in 6 months for pap and physical or sooner if needed

## 2023-10-11 LAB — CERVICOVAGINAL ANCILLARY ONLY
Bacterial Vaginitis (gardnerella): POSITIVE — AB
Candida Glabrata: NEGATIVE
Candida Vaginitis: NEGATIVE
Chlamydia: NEGATIVE
Comment: NEGATIVE
Comment: NEGATIVE
Comment: NEGATIVE
Comment: NEGATIVE
Comment: NEGATIVE
Comment: NORMAL
Neisseria Gonorrhea: NEGATIVE
Trichomonas: NEGATIVE

## 2023-10-14 ENCOUNTER — Other Ambulatory Visit: Payer: Self-pay | Admitting: Adult Health

## 2023-11-27 DIAGNOSIS — K029 Dental caries, unspecified: Secondary | ICD-10-CM | POA: Diagnosis not present

## 2023-12-18 ENCOUNTER — Ambulatory Visit (INDEPENDENT_AMBULATORY_CARE_PROVIDER_SITE_OTHER): Payer: BC Managed Care – PPO | Admitting: Adult Health

## 2023-12-18 ENCOUNTER — Encounter: Payer: Self-pay | Admitting: Adult Health

## 2023-12-18 VITALS — BP 123/76 | HR 77 | Ht 67.0 in | Wt 155.0 lb

## 2023-12-18 DIAGNOSIS — K649 Unspecified hemorrhoids: Secondary | ICD-10-CM

## 2023-12-18 DIAGNOSIS — B009 Herpesviral infection, unspecified: Secondary | ICD-10-CM

## 2023-12-18 DIAGNOSIS — K0262 Dental caries on smooth surface penetrating into dentin: Secondary | ICD-10-CM | POA: Diagnosis not present

## 2023-12-18 MED ORDER — VALACYCLOVIR HCL 1 G PO TABS: 1000.0000 mg | ORAL_TABLET | Freq: Two times a day (BID) | ORAL | 2 refills | Status: AC

## 2023-12-18 NOTE — Progress Notes (Signed)
  Subjective:     Patient ID: Tiffany Goodwin, female   DOB: Nov 22, 1990, 33 y.o.   MRN: [id number]  HPI Tiffany Goodwin is 33 year old white female, single, G2P2002, in complaining of a knot, irritated area near rectum. May bleed with BM.     Component Value Date/Time   DIAGPAP  02/22/2021 0935    - Negative for Intraepithelial Lesions or Malignancy (NILM)   DIAGPAP - Benign reactive/reparative changes 02/22/2021 0935   DIAGPAP (A) 02/17/2020 0958    - Atypical squamous cells of undetermined significance (ASC-US)   HPVHIGH Negative 02/22/2021 0935   HPVHIGH Positive (A) 02/17/2020 0958   ADEQPAP  02/22/2021 0935    Satisfactory for evaluation; transformation zone component PRESENT.   ADEQPAP  02/17/2020 0958    Satisfactory for evaluation; transformation zone component PRESENT.   ADEQPAP (A) 03/31/2018 0000    Satisfactory for evaluation  endocervical/transformation zone component PRESENT.  PCP is I Polanco  Review of Systems +knot, irritated area near rectum.    May bleed with BM Reviewed past medical,surgical, social and family history. Reviewed medications and allergies.  Objective:   Physical Exam BP 123/76 (BP Location: Left Arm, Patient Position: Sitting, Cuff Size: Normal)   Pulse 77   Ht 5\' 7"  (1.702 m)   Wt 155 lb (70.3 kg)   LMP 12/11/2023 (Approximate)   BMI 24.28 kg/m     Skin warm and dry. Has vesicle right buttock near rectum and has small external hemorrhoid Examination chaperoned by Malachy Mood LPN Fall risk is low  Upstream - 12/18/23 1553       Pregnancy Intention Screening   Does the patient want to become pregnant in the next year? No    Does the patient's partner want to become pregnant in the next year? No    Would the patient like to discuss contraceptive options today? No      Contraception Wrap Up   Current Method Oral Contraceptive    End Method Oral Contraceptive    Contraception Counseling Provided Yes             Assessment:     1. Herpes  (Primary) Has hx herpes Has 1 small vesicle Will rx valtrex 1 gm bid x 10 days Meds ordered this encounter  Medications   valACYclovir (VALTREX) 1000 MG tablet    Sig: Take 1 tablet (1,000 mg total) by mouth 2 (two) times daily.    Dispense:  20 tablet    Refill:  2    Supervising Provider:   Duane Lope H [[id number]]     2. Hemorrhoids, unspecified hemorrhoid type Has small external hemorrhoid  Use preparation H and wet wipes     Plan:     Follow up prn

## 2024-01-16 NOTE — Progress Notes (Unsigned)
   Complete physical exam  Patient: Tiffany Goodwin   DOB: [birth year]   32 y.o. Female  MRN: [id number]  Subjective:    No chief complaint on file.   Tiffany Goodwin is a 34 y.o. female who presents today for a complete physical exam. She reports consuming a {diet types:17450} diet. {types:19826} She generally feels {DESC; WELL/FAIRLY WELL/POORLY:18703}. She reports sleeping {DESC; WELL/FAIRLY WELL/POORLY:18703}. She {does/does not:200015} have additional problems to discuss today.    Most recent fall risk assessment:    12/18/2023    3:53 PM  Fall Risk   Falls in the past year? 0  Number falls in past yr: 0  Injury with Fall? 0     Most recent depression screenings:    07/18/2023    3:44 PM 05/15/2023    3:11 PM  PHQ 2/9 Scores  PHQ - 2 Score 1 1  PHQ- 9 Score 3     {VISON DENTAL STD PSA (Optional):27386}  Patient Care Team: Del Newman Nip, Tenna Child, FNP as PCP - General (Family Medicine)   Outpatient Medications Prior to Visit  Medication Sig   albuterol (VENTOLIN HFA) 108 (90 Base) MCG/ACT inhaler Inhale 2 puffs into the lungs every 6 (six) hours as needed for wheezing or shortness of breath.   atorvastatin (LIPITOR) 10 MG tablet Take 10 mg by mouth daily.   hydrOXYzine (ATARAX) 10 MG tablet Take 1 tablet (10 mg total) by mouth 3 (three) times daily as needed for itching.   Norethindrone Acetate-Ethinyl Estrad-FE (LOESTRIN 24 FE) 1-20 MG-MCG(24) tablet Take 1 tablet by mouth daily.   valACYclovir (VALTREX) 1000 MG tablet Take 1 tablet (1,000 mg total) by mouth 2 (two) times daily.   No facility-administered medications prior to visit.    ROS     Objective:    LMP 12/11/2023 (Approximate)  {Vitals History (Optional):23777}  Physical Exam   No results found for any visits on 01/17/24.    Assessment & Plan:    Routine Health Maintenance and Physical Exam  Immunization History  Administered Date(s) Administered   Tdap 11/27/2011    Health Maintenance  Topic  Date Due   DTaP/Tdap/Td (2 - Td or Tdap) 11/26/2021   INFLUENZA VACCINE  Never done   COVID-19 Vaccine (1 - 2024-25 season) Never done   Cervical Cancer Screening (HPV/Pap Cotest)  02/23/2024   Hepatitis C Screening  Completed   HIV Screening  Completed   HPV VACCINES  Aged Out    Discussed health benefits of physical activity, and encouraged her to engage in regular exercise appropriate for her age and condition.  Prediabetes  Mixed hyperlipidemia    Return in about 1 year (around 01/16/2025), or if symptoms worsen or fail to improve, for Annual Physical.     Cruzita Lederer Newman Nip, FNP

## 2024-01-16 NOTE — Patient Instructions (Signed)

## 2024-01-17 ENCOUNTER — Ambulatory Visit: Payer: BC Managed Care – PPO | Admitting: Family Medicine

## 2024-01-19 DIAGNOSIS — R030 Elevated blood-pressure reading, without diagnosis of hypertension: Secondary | ICD-10-CM | POA: Diagnosis not present

## 2024-01-19 DIAGNOSIS — J101 Influenza due to other identified influenza virus with other respiratory manifestations: Secondary | ICD-10-CM | POA: Diagnosis not present

## 2024-01-19 DIAGNOSIS — Z6825 Body mass index (BMI) 25.0-25.9, adult: Secondary | ICD-10-CM | POA: Diagnosis not present

## 2024-01-19 DIAGNOSIS — E663 Overweight: Secondary | ICD-10-CM | POA: Diagnosis not present

## 2024-01-27 ENCOUNTER — Other Ambulatory Visit: Payer: Self-pay | Admitting: Family Medicine

## 2024-01-27 MED ORDER — ATORVASTATIN CALCIUM 10 MG PO TABS: 10.0000 mg | ORAL_TABLET | Freq: Every day | ORAL | 0 refills | Status: DC

## 2024-01-27 NOTE — Telephone Encounter (Signed)
 Copied from CRM [id number]. Topic: Clinical - Medication Refill >> Jan 27, 2024 11:02 AM Elle L wrote: Most Recent Primary Care Visit:  Provider: [facility]  Department: [facility]  Visit Type: PHYSICAL  Date: 07/18/2023  Medication: atorvastatin (LIPITOR) 10 MG tablet   Has the patient contacted their pharmacy? Yes  Is this the correct pharmacy for this prescription? Yes If no, delete pharmacy and type the correct one.  This is the patient's preferred pharmacy:  [facility] [address] [place] - [address] [place] [address] [place] [address] Phone: [phone] Fax: [phone]   Has the prescription been filled recently? Yes  Is the patient out of the medication? Yes, the patient ran out of 2 days ago.   Has the patient been seen for an appointment in the last year OR does the patient have an upcoming appointment? Yes  Can we respond through MyChart? Yes  Agent: Please be advised that Rx refills may take up to 3 business days. We ask that you follow-up with your pharmacy.

## 2024-01-30 ENCOUNTER — Encounter: Payer: Self-pay | Admitting: Family Medicine

## 2024-01-30 ENCOUNTER — Ambulatory Visit (INDEPENDENT_AMBULATORY_CARE_PROVIDER_SITE_OTHER): Payer: Self-pay | Admitting: Family Medicine

## 2024-01-30 VITALS — BP 126/75 | HR 75 | Ht 67.0 in | Wt 151.0 lb

## 2024-01-30 DIAGNOSIS — T7840XA Allergy, unspecified, initial encounter: Secondary | ICD-10-CM | POA: Diagnosis not present

## 2024-01-30 DIAGNOSIS — R7303 Prediabetes: Secondary | ICD-10-CM

## 2024-01-30 DIAGNOSIS — E782 Mixed hyperlipidemia: Secondary | ICD-10-CM

## 2024-01-30 MED ORDER — HYDROXYZINE HCL 10 MG PO TABS
10.0000 mg | ORAL_TABLET | Freq: Three times a day (TID) | ORAL | 0 refills | Status: AC | PRN
Start: 2024-01-30 — End: ?

## 2024-01-30 MED ORDER — ATORVASTATIN CALCIUM 10 MG PO TABS: 10.0000 mg | ORAL_TABLET | Freq: Every day | ORAL | 0 refills | Status: DC

## 2024-01-30 MED ORDER — ALBUTEROL SULFATE HFA 108 (90 BASE) MCG/ACT IN AERS: 2.0000 | INHALATION_SPRAY | Freq: Four times a day (QID) | RESPIRATORY_TRACT | 2 refills | Status: DC | PRN

## 2024-01-30 NOTE — Patient Instructions (Signed)

## 2024-01-30 NOTE — Assessment & Plan Note (Signed)
 Last Hemoglobin A1c: 5.7 Labs: Ordered today, results pending; will follow up accordingly. Reviewed non-pharmacological interventions, including a balanced diet rich in lean proteins, healthy fats, whole grains, and high-fiber vegetables. Emphasized reducing refined sugars and processed carbohydrates, and incorporating more fruits, leafy greens, and legumes. Patient Understanding: The patient verbalized understanding of the care plan, and all questions were answered.

## 2024-01-30 NOTE — Progress Notes (Signed)
 Established Patient Office Visit   Subjective  Patient ID: Tiffany Goodwin, female    DOB: [birth year]  Age: 33 y.o. MRN: [id number]  Chief Complaint  Patient presents with   Medical Management of Chronic Issues    Medication Refill     She  has a past medical history of Abnormal Pap smear of cervix (04/03/2018), Anxiety, BV (bacterial vaginosis) (10/21/2014), High cholesterol, Itching in the vaginal area (12/07/2015), LLQ pain (04/19/2015), Missed period (04/19/2015), Patient desires pregnancy (12/07/2015), Pelvic pain in female (11/18/2014), Pneumonia (2016, january; 12/2018), Screening for STD (sexually transmitted disease) (01/20/2015), Spider vein of right lower extremity (09/04/2017), and Vaginal discharge (10/21/2014).  HPI Patient presents to the clinic for follow up. For the details of today's visit, please refer to assessment and plan.   Review of Systems  Constitutional:  Negative for chills and fever.  Respiratory:  Negative for shortness of breath.   Cardiovascular:  Negative for chest pain.  Gastrointestinal:  Negative for abdominal pain.  Genitourinary:  Negative for dysuria.  Neurological:  Negative for dizziness and headaches.      Objective:     BP 126/75   Pulse 75   Ht 5\' 7"  (1.702 m)   Wt 151 lb 0.6 oz (68.5 kg)   LMP 01/04/2024 (Approximate)   SpO2 96%   BMI 23.66 kg/m  BP Readings from Last 3 Encounters:  01/30/24 126/75  12/18/23 123/76  10/09/23 137/82      Physical Exam Vitals reviewed.  Constitutional:      General: She is not in acute distress.    Appearance: Normal appearance. She is not ill-appearing, toxic-appearing or diaphoretic.  HENT:     Head: Normocephalic.  Eyes:     General:        Right eye: No discharge.        Left eye: No discharge.     Conjunctiva/sclera: Conjunctivae normal.  Cardiovascular:     Rate and Rhythm: Normal rate.     Pulses: Normal pulses.     Heart sounds: Normal heart sounds.  Pulmonary:     Effort:  Pulmonary effort is normal. No respiratory distress.     Breath sounds: Normal breath sounds.  Abdominal:     General: Bowel sounds are normal.     Palpations: Abdomen is soft.     Tenderness: There is no abdominal tenderness. There is no right CVA tenderness, left CVA tenderness or guarding.  Musculoskeletal:        General: Normal range of motion.  Skin:    General: Skin is warm and dry.     Capillary Refill: Capillary refill takes less than 2 seconds.  Neurological:     Mental Status: She is alert.     Coordination: Coordination normal.     Gait: Gait normal.  Psychiatric:        Mood and Affect: Mood normal.        Behavior: Behavior normal.      No results found for any visits on 01/30/24.  The ASCVD Risk score (Arnett DK, et al., 2019) failed to calculate for the following reasons:   The 2019 ASCVD risk score is only valid for ages 77 to 35    Assessment & Plan:  Prediabetes Assessment & Plan: Last Hemoglobin A1c: 5.7 Labs: Ordered today, results pending; will follow up accordingly. Reviewed non-pharmacological interventions, including a balanced diet rich in lean proteins, healthy fats, whole grains, and high-fiber vegetables. Emphasized reducing refined sugars and processed carbohydrates, and  incorporating more fruits, leafy greens, and legumes. Patient Understanding: The patient verbalized understanding of the care plan, and all questions were answered.   Orders: -     Hemoglobin A1c  Allergic reaction, initial encounter -     hydrOXYzine HCl; Take 1 tablet (10 mg total) by mouth 3 (three) times daily as needed for itching.  Dispense: 30 tablet; Refill: 0  Mixed hyperlipidemia -     Lipid panel  Other orders -     Atorvastatin Calcium; Take 1 tablet (10 mg total) by mouth daily.  Dispense: 30 tablet; Refill: 0 -     Albuterol Sulfate HFA; Inhale 2 puffs into the lungs every 6 (six) hours as needed for wheezing or shortness of breath.  Dispense: 8 g; Refill:  2    Return in about 1 year (around 01/29/2025), or if symptoms worsen or fail to improve, for Follow up.   Cruzita Lederer Newman Nip, FNP

## 2024-02-05 DIAGNOSIS — R7303 Prediabetes: Secondary | ICD-10-CM | POA: Diagnosis not present

## 2024-02-05 DIAGNOSIS — E782 Mixed hyperlipidemia: Secondary | ICD-10-CM | POA: Diagnosis not present

## 2024-02-06 ENCOUNTER — Encounter: Payer: Self-pay | Admitting: Family Medicine

## 2024-02-06 LAB — HEMOGLOBIN A1C
Est. average glucose Bld gHb Est-mCnc: 111 mg/dL
Hgb A1c MFr Bld: 5.5 % (ref 4.8–5.6)

## 2024-02-06 LAB — LIPID PANEL
Chol/HDL Ratio: 2.9 ratio (ref 0.0–4.4)
Cholesterol, Total: 172 mg/dL (ref 100–199)
HDL: 59 mg/dL (ref >39)
LDL Chol Calc (NIH): 99 mg/dL (ref 0–99)
Triglycerides: 73 mg/dL (ref 0–149)
VLDL Cholesterol Cal: 14 mg/dL (ref 5–40)

## 2024-02-12 DIAGNOSIS — K0262 Dental caries on smooth surface penetrating into dentin: Secondary | ICD-10-CM | POA: Diagnosis not present

## 2024-04-08 ENCOUNTER — Other Ambulatory Visit (HOSPITAL_COMMUNITY)
Admission: RE | Admit: 2024-04-08 | Discharge: 2024-04-08 | Disposition: A | Discharge status: Home / Self Care | Source: Ambulatory Visit | Attending: Adult Health | Admitting: Adult Health

## 2024-04-08 ENCOUNTER — Encounter: Payer: Self-pay | Admitting: Adult Health

## 2024-04-08 ENCOUNTER — Ambulatory Visit: Payer: Self-pay

## 2024-04-08 ENCOUNTER — Ambulatory Visit: Admitting: Adult Health

## 2024-04-08 VITALS — BP 112/77 | HR 69 | Ht 67.0 in | Wt 147.0 lb

## 2024-04-08 DIAGNOSIS — Z3041 Encounter for surveillance of contraceptive pills: Secondary | ICD-10-CM

## 2024-04-08 DIAGNOSIS — Z01419 Encounter for gynecological examination (general) (routine) without abnormal findings: Secondary | ICD-10-CM | POA: Insufficient documentation

## 2024-04-08 MED ORDER — NORETHIN ACE-ETH ESTRAD-FE 1-20 MG-MCG(24) PO TABS: 1.0000 | ORAL_TABLET | Freq: Every day | ORAL | 4 refills | Status: AC

## 2024-04-08 NOTE — Progress Notes (Addendum)
 Patient ID: Tiffany Goodwin, female   DOB: 1991-07-31, 33 y.o.   MRN: [id number] History of Present Illness: Tiffany Goodwin is a 33 year old white female, single, G2P2002, in for a well woman gyn exam and pap. Her mom recently diagnosed with lung cancer at age 69, she is a smoker.   PCP is Tiffany Polanco, NP   Current Medications, Allergies, Past Medical History, Past Surgical History, Family History and Social History were reviewed in [employer] record.     Review of Systems: Patient denies any headaches, hearing loss, fatigue, blurred vision, shortness of breath, chest pain, abdominal pain, problems with bowel movements, urination, or intercourse. No joint pain or mood swings.  Sore throat on and off for 2 weeks has drainage, to see PCP Happy with BCP   Physical Exam:BP 112/77 (BP Location: Left Arm, Patient Position: Sitting, Cuff Size: Normal)   Pulse 69   Ht 5\' 7"  (1.702 m)   Wt 147 lb (66.7 kg)   LMP 03/25/2024 (Approximate)   BMI 23.02 kg/m   General:  Well developed, well nourished, no acute distress Skin:  Warm and dry Neck:  Midline trachea, normal thyroid, good ROM, no lymphadenopathy Lungs; Clear to auscultation bilaterally Breast:  No dominant palpable mass, retraction, or nipple discharge Cardiovascular: Regular rate and rhythm Abdomen:  Soft, non tender, no hepatosplenomegaly Pelvic:  External genitalia is normal in appearance, no lesions.  The vagina is normal in appearance. Urethra has no lesions or masses. The cervix is bulbous. Pap with HR HPV genotyping performed.  Uterus is felt to be normal size, shape, and contour.  No adnexal masses or tenderness noted.Bladder is non tender, no masses felt. Extremities/musculoskeletal:  No swelling or varicosities noted, no clubbing or cyanosis Psych:  No mood changes, alert and cooperative,seems happy AA is 1    04/08/2024   11:51 AM 07/18/2023    3:44 PM 05/15/2023    3:11 PM  Depression screen PHQ 2/9  Decreased  Interest 1 1 1   Down, Depressed, Hopeless 0 0 0  PHQ - 2 Score 1 1 1   Altered sleeping 0 1   Tired, decreased energy 1 1   Change in appetite 0 0   Feeling bad or failure about yourself  0 0   Trouble concentrating 0 0   Moving slowly or fidgety/restless 0 0   Suicidal thoughts 0 0   PHQ-9 Score 2 3   Difficult doing work/chores  Not difficult at all        04/08/2024   11:52 AM 07/18/2023    3:44 PM 03/28/2023    1:39 PM 01/28/2023    2:56 PM  GAD 7 : Generalized Anxiety Score  Nervous, Anxious, on Edge 0 0 0 0  Control/stop worrying 1 0 1 1  Worry too much - different things 1 1 1  0  Trouble relaxing 1 0 0 1  Restless 0 0 0 0  Easily annoyed or irritable 0 1 1 2   Afraid - awful might happen 0 0 0 0  Total GAD 7 Score 3 2 3 4   Anxiety Difficulty  Not difficult at all  Not difficult at all      Upstream - 04/08/24 1148       Pregnancy Intention Screening   Does the patient want to become pregnant in the next year? No    Does the patient's partner want to become pregnant in the next year? No    Would the patient like to  discuss contraceptive options today? No      Contraception Wrap Up   Current Method Oral Contraceptive    End Method Oral Contraceptive    Contraception Counseling Provided Yes            Examination chaperoned by Tiffany Aschoff LPN   Impression and Plan: 1. Encounter for gynecological examination with Papanicolaou smear of cervix (Primary) Pap sent Pap in 3 years if normal Physical in 1 year  - Cytology - PAP( [place])  2. Encounter for surveillance of contraceptive pills Happy with Loestrin 24 Meds ordered this encounter  Medications   Norethindrone Acetate-Ethinyl Estrad-FE (LOESTRIN 24 FE) 1-20 MG-MCG(24) tablet    Sig: Take 1 tablet by mouth daily.    Dispense:  84 tablet    Refill:  4    Supervising Provider:   Evalyn Goodwin H [[id number]]

## 2024-04-08 NOTE — Telephone Encounter (Signed)
 Copied from CRM [id number]. Topic: Clinical - Red Word Triage >> Apr 08, 2024 12:31 PM Everette C wrote: [facility] that prompted transfer to Nurse Triage: the patient has called to share that they have experienced difficulty swallowing for roughly 1 week  Chief Complaint: swallowing difficulty Symptoms: at times difficulty swallowing, mild sore throat,  Frequency: x 1 week Pertinent Negatives: Patient denies SOB or difficulty breathing Disposition: [] ED /[] Urgent Care (no appt availability in office) / [x] Appointment(In office/virtual)/ []  [facility]/ [] Home Care/ [] Refused Recommended Disposition /[] [facility]/ []  Follow-up with PCP Additional Notes: pt states having swallowing difficulty at times, feels drainage in back of throat, and sore is sore at times.  Pt does not report fever or lump in throat  Reason for Disposition  Swallowing difficulty is a chronic symptom (recurrent or ongoing AND present > 4 weeks)  Answer Assessment - Initial Assessment Questions 1. DESCRIPTION: "Tell me more about this problem." "Are you  having trouble swallowing liquids, solids, or both?" "Any trouble with swallowing saliva (spit)?"     Difficulty swallowing, 2. SEVERITY: "How bad is the swallowing difficulty?"  (e.g., Scale 1-10; or mild, moderate, severe)   - MILD (0-3): Occasional swallowing difficulty; has trouble swallowing certain types of foods or liquids.   - MODERATE (4-7): Frequent swallowing difficulty; only able to swallow small amounts of foods and fluids.   - SEVERE (8-10): Unable to swallow any foods, fluids, or saliva; sensation of "lump in throat" or "something stuck in throat", and frequent drooling or spitting may be present.     mild 3. ONSET: "When did the swallowing problems begin?"      X 1 week 4. CAUSE: "What do you think is causing the problem?"  (e.g., dry mouth, food or pill stuck in throat, mouth pain, sore throat, progression of disease process such as  dementia or Parkinson's disease).      unknown 5. CHRONIC or RECURRENT: "Is this a new problem for you?"  If No, ask: "How long have you had this problem?" (e.g., days, weeks, months)      no 6. OTHER SYMPTOMS: "Do you have any other symptoms?" (e.g., chest pain, difficulty breathing, mouth sores, sore throat, swollen tongue, chest pain)     Sore throat,  7. PREGNANCY: "Is there any chance you are pregnant?" "When was your last menstrual period?"     N/a  Protocols used: Swallowing Difficulty-A-AH

## 2024-04-15 ENCOUNTER — Ambulatory Visit (INDEPENDENT_AMBULATORY_CARE_PROVIDER_SITE_OTHER): Payer: Self-pay

## 2024-04-15 VITALS — BP 127/82 | HR 72 | Ht 67.0 in | Wt 147.0 lb

## 2024-04-15 DIAGNOSIS — R09A2 Foreign body sensation, throat: Secondary | ICD-10-CM | POA: Diagnosis not present

## 2024-04-15 LAB — CYTOLOGY - PAP
Comment: NEGATIVE
Comment: NEGATIVE
Comment: NEGATIVE
HPV 16: NEGATIVE
HPV 18 / 45: NEGATIVE
High risk HPV: POSITIVE — AB

## 2024-04-15 MED ORDER — FAMOTIDINE 40 MG PO TABS: 40.0000 mg | ORAL_TABLET | Freq: Every day | ORAL | 0 refills | Status: DC

## 2024-04-15 NOTE — Progress Notes (Unsigned)
   Acute Office Visit  Subjective:     Patient ID: Tiffany Goodwin, female    DOB: May 28, 1991, 33 y.o.   MRN: [id number]  Chief Complaint  Patient presents with   Medical Management of Chronic Issues    Pt states "Difficulty swallowing x 2 weeks"    HPI Patient is in today for trouble swallowing for 2 weeks.  She states that it is worse at bedtime when she lies down.  She denies any acid reflux symptoms.   ROS     Objective:    BP 127/82   Pulse 72   Ht 5\' 7"  (1.702 m)   Wt 147 lb 0.6 oz (66.7 kg)   LMP 03/25/2024 (Approximate)   SpO2 98%   BMI 23.03 kg/m    Physical Exam Vitals and nursing note reviewed.  Constitutional:      Appearance: Normal appearance.  HENT:     Head: Normocephalic.     Right Ear: Tympanic membrane, ear canal and external ear normal.     Left Ear: Tympanic membrane, ear canal and external ear normal.     Nose: Nose normal.     Mouth/Throat:     Mouth: Mucous membranes are moist.     Pharynx: Oropharynx is clear.  Cardiovascular:     Rate and Rhythm: Normal rate and regular rhythm.  Pulmonary:     Effort: Pulmonary effort is normal.     Breath sounds: Normal breath sounds.  Musculoskeletal:     Cervical back: Normal range of motion and neck supple.  Skin:    General: Skin is warm and dry.  Neurological:     Mental Status: She is alert and oriented to person, place, and time.  Psychiatric:        Mood and Affect: Mood normal.        Thought Content: Thought content normal.     No results found for any visits on 04/15/24.      Assessment & Plan:   Problem List Items Addressed This Visit   None Visit Diagnoses       Globus sensation    -  Primary   will add famotidine  40 mg at bedtime to see if any improvement.  recommend adding Zyrtec or Claritin  if this is not helping.       Meds ordered this encounter  Medications   famotidine  (PEPCID ) 40 MG tablet    Sig: Take 1 tablet (40 mg total) by mouth at bedtime.    Dispense:   30 tablet    Refill:  0    No follow-ups on file.  Alison Irvine, FNP

## 2024-04-16 ENCOUNTER — Ambulatory Visit: Payer: Self-pay | Admitting: Adult Health

## 2024-04-16 DIAGNOSIS — R87612 Low grade squamous intraepithelial lesion on cytologic smear of cervix (LGSIL): Secondary | ICD-10-CM | POA: Insufficient documentation

## 2024-07-09 ENCOUNTER — Ambulatory Visit
Admission: EM | Admit: 2024-07-09 | Discharge: 2024-07-09 | Disposition: A | Discharge status: Home / Self Care | Attending: Family Medicine | Admitting: Family Medicine

## 2024-07-09 DIAGNOSIS — U071 COVID-19: Secondary | ICD-10-CM | POA: Diagnosis not present

## 2024-07-09 DIAGNOSIS — J069 Acute upper respiratory infection, unspecified: Secondary | ICD-10-CM | POA: Diagnosis not present

## 2024-07-09 LAB — POC SOFIA SARS ANTIGEN FIA: SARS Coronavirus 2 Ag: POSITIVE — AB

## 2024-07-09 MED ORDER — AZELASTINE HCL 0.1 % NA SOLN: 1.0000 | Freq: Two times a day (BID) | NASAL | 0 refills | Status: DC

## 2024-07-09 MED ORDER — PROMETHAZINE-DM 6.25-15 MG/5ML PO SYRP: 5.0000 mL | ORAL_SOLUTION | Freq: Four times a day (QID) | ORAL | 0 refills | Status: DC | PRN

## 2024-07-09 NOTE — ED Provider Notes (Signed)
 [facility]    CSN: [id number] Arrival date & time: 07/09/24  1835      History   Chief Complaint Chief Complaint  Patient presents with   Fatigue   Sore Throat   Cough   Facial Pain    HPI Tiffany Goodwin is a 33 y.o. female.   Patient presenting today with several day history of cough, sinus pressure, fatigue, nasal congestion, sore throat.  Denies fever, chills, body aches, chest pain, shortness of breath, abdominal pain, diarrhea.  So far not trying anything over-the-counter for symptoms.  Son has been sick with similar symptoms.    Past Medical History:  Diagnosis Date   Abnormal Pap smear of cervix 04/03/2018   03/31/18: ASCUS/HPV   Colpo___   Anxiety    past   BV (bacterial vaginosis) 10/21/2014   High cholesterol    Itching in the vaginal area 12/07/2015   LLQ pain 04/19/2015   Missed period 04/19/2015   Patient desires pregnancy 12/07/2015   Pelvic pain in female 11/18/2014   Pneumonia 2016, january; 12/2018   left side   Screening for STD (sexually transmitted disease) 01/20/2015   Spider vein of right lower extremity 09/04/2017   Vaginal discharge 10/21/2014    Patient Active Problem List   Diagnosis Date Noted   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   Prediabetes 01/30/2024   Herpes 12/18/2023   Vaginal odor 10/09/2023   Vaginal discharge 10/09/2023   Encounter for routine adult physical exam with abnormal findings 07/18/2023   Allergic reaction 05/15/2023   Shoulder pain, bilateral 01/28/2023   Viral upper respiratory illness 01/28/2023   Constipation 02/28/2022   Hemorrhoids 02/28/2022   Anxiety 02/28/2022   Encounter for well woman exam with routine gynecological exam 02/28/2022   Screening examination for STD (sexually transmitted disease) 02/28/2022   Rectal bleeding 02/28/2022   Encounter for gynecological examination with Papanicolaou smear of cervix 02/28/2022   Encounter for screening fecal occult blood testing 02/28/2022    Enlarged thyroid 07/05/2021   HLD (hyperlipidemia) 07/05/2021   COVID-19 06/06/2021   Encounter for surveillance of contraceptive pills 02/22/2021   History of abnormal cervical Pap smear 02/22/2021   Encounter for general adult medical examination with abnormal findings 02/22/2021   Depression, major, single episode, 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 Authorizing Provider  azelastine  (ASTELIN ) 0.1 % nasal spray Place 1 spray into both nostrils 2 (two) times daily. Use in each nostril as directed 07/09/24  Yes Stuart Vernell Norris, PA-C  Norethindrone Acetate-Ethinyl Estrad-FE (LOESTRIN 24 FE) 1-20 MG-MCG(24) tablet Take 1 tablet by mouth daily. 04/08/24  Yes Signa Delon LABOR, NP  promethazine -dextromethorphan (PROMETHAZINE -DM) 6.25-15 MG/5ML syrup Take 5 mLs by mouth 4 (four) times daily as needed. 07/09/24  Yes  Stuart Vernell Norris, PA-C  albuterol  (VENTOLIN  HFA) 108 [phone] Base) MCG/ACT inhaler Inhale 2 puffs into the lungs every 6 (six) hours as needed for wheezing or shortness of breath. 01/30/24   Del Wilhelmena Lloyd Sola, FNP  famotidine  (PEPCID ) 40 MG tablet Take 1 tablet (40 mg total) by mouth at bedtime. 04/15/24   Bevely Doffing, FNP  hydrOXYzine  (ATARAX ) 10 MG tablet Take 1 tablet (10 mg total) by mouth 3 (three) times daily as needed for itching. 01/30/24   Del Wilhelmena Lloyd Sola, FNP  valACYclovir  (VALTREX ) 1000 MG tablet Take 1 tablet (1,000 mg total) by mouth 2 (two) times daily. 12/18/23   Signa Delon LABOR, NP    Family History Family History   Problem Relation Age of Onset   Other Paternal Grandfather        brain aneursym   Heart attack Maternal Grandmother    Stroke Maternal Grandmother    Dementia Maternal Grandmother    Cancer Maternal Grandfather        bone,chest,liver,brain,pancreatic,throat   Cancer Mother        lung   Anesthesia problems Neg Hx    Hypotension Neg Hx    Malignant hyperthermia Neg Hx    Pseudochol deficiency Neg Hx     Social History Social History   Tobacco Use   Smoking status: Never   Smokeless tobacco: Never  Vaping Use   Vaping status: Never Used  Substance Use Topics   Alcohol use: Yes    Comment: occ   Drug use: No     Allergies   Patient has no known allergies.   Review of Systems Review of Systems Per HPI  Physical Exam Triage Vital Signs ED Triage Vitals  Encounter Vitals Group     BP 07/09/24 1842 (!) 142/89     Girls Systolic BP Percentile --      Girls Diastolic BP Percentile --      Boys Systolic BP Percentile --      Boys Diastolic BP Percentile --      Pulse Rate 07/09/24 1842 78     Resp 07/09/24 1842 16     Temp 07/09/24 1842 98.1 F (36.7 C)     Temp Source 07/09/24 1842 Oral     SpO2 07/09/24 1842 96 %     Weight --      Height --      Head Circumference --      Peak Flow --      Pain Score 07/09/24 1843 7     Pain Loc --      Pain Education --      Exclude from Growth Chart --    No data found.  Updated Vital Signs BP (!) 142/89 (BP Location: Left Arm)   Pulse 78   Temp 98.1 F (36.7 C) (Oral)   Resp 16   LMP 06/15/2024 (Approximate)   SpO2 96%   Visual Acuity Right Eye Distance:   Left Eye Distance:   Bilateral Distance:    Right Eye Near:   Left Eye Near:    Bilateral Near:     Physical Exam Vitals and nursing note reviewed.  Constitutional:      Appearance: Normal appearance.  HENT:     Head: Atraumatic.     Right Ear: Tympanic membrane and external ear normal.     Left Ear: Tympanic membrane and external ear normal.      Nose: Rhinorrhea present.     Mouth/Throat:  Mouth: Mucous membranes are moist.     Pharynx: Posterior oropharyngeal erythema present.  Eyes:     Extraocular Movements: Extraocular movements intact.     Conjunctiva/sclera: Conjunctivae normal.  Cardiovascular:     Rate and Rhythm: Normal rate and regular rhythm.     Heart sounds: Normal heart sounds.  Pulmonary:     Effort: Pulmonary effort is normal.     Breath sounds: Normal breath sounds. No wheezing or rales.  Musculoskeletal:        General: Normal range of motion.     Cervical back: Normal range of motion and neck supple.  Skin:    General: Skin is warm and dry.  Neurological:     Mental Status: She is alert and oriented to person, place, and time.  Psychiatric:        Mood and Affect: Mood normal.        Thought Content: Thought content normal.      UC Treatments / Results  Labs (all labs ordered are listed, but only abnormal results are displayed) Labs Reviewed  POC SOFIA SARS ANTIGEN FIA - Abnormal; Notable for the following components:      Result Value   SARS Coronavirus 2 Ag Positive (*)    All other components within normal limits    EKG   Radiology No results found.  Procedures Procedures (including critical care time)  Medications Ordered in UC Medications - No data to display  Initial Impression / Assessment and Plan / UC Course  I have reviewed the triage vital signs and the nursing notes.  Pertinent labs & imaging results that were available during my care of the patient were reviewed by me and considered in my medical decision making (see chart for details).     Overall vitals reassuring, she is well-appearing and in no acute distress.  Rapid COVID-positive.  Will treat with Phenergan  DM, Astelin , supportive over-the-counter medications at home care.  Return for worsening symptoms.  Final Clinical Impressions(s) / UC Diagnoses   Final diagnoses:  Viral URI with cough  COVID-19    Discharge Instructions   None    ED Prescriptions     Medication Sig Dispense Auth. Provider   promethazine -dextromethorphan (PROMETHAZINE -DM) 6.25-15 MG/5ML syrup Take 5 mLs by mouth 4 (four) times daily as needed. 100 mL Stuart Vernell Norris, PA-C   azelastine  (ASTELIN ) 0.1 % nasal spray Place 1 spray into both nostrils 2 (two) times daily. Use in each nostril as directed 30 mL Stuart Vernell Norris, PA-C      PDMP not reviewed this encounter.   Stuart Vernell Norris, [place] 07/09/24 1942

## 2024-07-09 NOTE — ED Triage Notes (Signed)
 Fatigue, cough,sinus pressure started today. Patient has not taken any medication to help.

## 2024-07-15 ENCOUNTER — Ambulatory Visit: Payer: Self-pay

## 2024-07-15 NOTE — Telephone Encounter (Signed)
 Appt made.

## 2024-07-15 NOTE — Telephone Encounter (Signed)
 FYI Only or Action Required?: FYI only for provider.  Patient was last seen in primary care on 04/15/2024 by Bevely Doffing, FNP.  Called Nurse Triage reporting Covid Positive.  Symptoms began a week ago.  Interventions attempted: Prescription medications: Azelastine  nasal spray; promethazine  DM .  Symptoms are: productive cough, chest congestion/heaviness, headache, sore throat gradually improving.  Triage Disposition: See PCP When Office is Open (Within 3 Days) (overriding Call PCP Now)  Patient/caregiver understands and will follow disposition?: Yes            Copied from CRM [id number]. Topic: Clinical - Red Word Triage >> Jul 15, 2024  9:14 AM Avram MATSU wrote: Red Word that prompted transfer to Nurse Triage: chest pain   ----------------------------------------------------------------------- From previous Reason for Contact - Scheduling: Patient/patient representative is calling to schedule an appointment. Refer to attachments for appointment information. Reason for Disposition  [1] PERSISTING SYMPTOMS OF COVID-19 AND [2] NEW symptom AND [3] could be serious  Answer Assessment - Initial Assessment Questions 1. COVID-19 ONSET: When did the symptoms of COVID-19 first start?     07/09/24. Started with runny nose, headache, sore throat, body aches.  2. DIAGNOSIS CONFIRMATION: How were you diagnosed? (e.g., COVID-19 oral or nasal viral test; COVID-19 antibody test; doctor visit)     Doctor visit at urgent care.  3. MAIN SYMPTOM:  What is your main concern or symptom right now? (e.g., breathing difficulty, cough, fatigue. loss of smell)     Cough (states it was so severe at first it was causing her to vomit), head cold (headache and sore throat yesterday), chest congestion and describes a heaviness in her chest all over, pretty constant describes it as feeling like her congestion.  4. SYMPTOM ONSET: When did the  cough/chest congestion  start?     Monday  07/13/24.  5. BETTER-SAME-WORSE: Are you getting better, staying the same, or getting worse over the last 1 to 2 weeks?     Getting better.  6. RECENT MEDICAL VISIT: Have you been seen by a healthcare provider (doctor, NP, PA) for these persisting COVID-19 symptoms? If Yes, ask: When were you seen? (e.g., date)     Yes, seen on 07/09/24 at urgent care. Not seen for persisting symptoms since.  7. COUGH: Do you have a cough? If Yes, ask: How bad is the cough?       Yes.  8. FEVER: Do you have a fever? If Yes, ask: What is your temperature, how was it measured, and when did it start?     No.  9. BREATHING DIFFICULTY: Are you having any trouble breathing? If Yes, ask: How bad is your breathing? (e.g., mild, moderate, severe)    - MILD: No SOB at rest, mild SOB with walking, speaks normally in sentences, can lie down, no retractions, pulse < 100.    - MODERATE: SOB at rest, SOB with minimal exertion and prefers to sit, cannot lie down flat, speaks in phrases, mild retractions, audible wheezing, pulse 100-120.    - SEVERE: Very SOB at rest, speaks in single words, struggling to breathe, sitting hunched forward, retractions, pulse > 120.       No.  10. OTHER SYMPTOMS: Do you have any other symptoms?  (e.g., fatigue, headache, muscle pain, weakness)       No.  11. HIGH RISK DISEASE: Do you have any chronic medical problems? (e.g., asthma, heart or lung disease, weak immune system, obesity, etc.)       No.  12.  VACCINE: Have you gotten the COVID-19 vaccine? If Yes, ask: Which one, how many shots, when did you get it?       No.  13. PREGNANCY: Is there any chance you are pregnant? When was your last menstrual period?       LMP: about a month ago.  14. O2 SATURATION MONITOR:  Do you use an oxygen saturation monitor (pulse oximeter) at home? If Yes, ask What is your reading (oxygen level) today? What is your usual oxygen saturation reading? (e.g., 95%)        No.  Protocols used: Coronavirus (COVID-19) Persisting Symptoms Follow-up Call-[facility]

## 2024-07-17 ENCOUNTER — Ambulatory Visit: Payer: Self-pay

## 2024-08-19 ENCOUNTER — Telehealth: Payer: Self-pay

## 2024-08-19 NOTE — Telephone Encounter (Signed)
 Copied from CRM #[id number]. Topic: Appointments - Appointment Scheduling >> Aug 18, 2024  2:05 PM Emylou G wrote: Sick: throat is hurting/draining, body is hurting ( can she be seen sooner than 10/30? ) Please call patient

## 2024-09-24 ENCOUNTER — Ambulatory Visit (INDEPENDENT_AMBULATORY_CARE_PROVIDER_SITE_OTHER)

## 2024-09-24 ENCOUNTER — Other Ambulatory Visit (HOSPITAL_COMMUNITY)
Admission: RE | Admit: 2024-09-24 | Discharge: 2024-09-24 | Disposition: A | Discharge status: Home / Self Care | Source: Ambulatory Visit | Attending: Obstetrics & Gynecology | Admitting: Obstetrics & Gynecology

## 2024-09-24 DIAGNOSIS — N898 Other specified noninflammatory disorders of vagina: Secondary | ICD-10-CM | POA: Diagnosis not present

## 2024-09-24 NOTE — Progress Notes (Signed)
   NURSE VISIT- VAGINITIS  SUBJECTIVE:  [name] is a 33 y.o. [id number] GYN patientfemale here for a vaginal swab for vaginitis screening.  She reports the following symptoms: odor for a few days. Denies abnormal vaginal bleeding, significant pelvic pain, fever, or UTI symptoms.  OBJECTIVE:  There were no vitals taken for this visit.  Appears well, in no apparent distress  ASSESSMENT: Vaginal swab for vaginitis screening  PLAN: Self-collected vaginal probe for Bacterial Vaginosis, Yeast sent to lab Treatment: to be determined once results are received Follow-up as needed if symptoms persist/worsen, or new symptoms develop  Aleck FORBES Blase  09/24/2024 4:05 PM

## 2024-09-30 ENCOUNTER — Ambulatory Visit: Payer: Self-pay | Admitting: Adult Health

## 2024-09-30 LAB — CERVICOVAGINAL ANCILLARY ONLY
Bacterial Vaginitis (gardnerella): POSITIVE — AB
Comment: NEGATIVE
Comment: NEGATIVE
Comment: NEGATIVE

## 2024-09-30 MED ORDER — METRONIDAZOLE 500 MG PO TABS: 500.0000 mg | ORAL_TABLET | Freq: Two times a day (BID) | ORAL | 0 refills | Status: DC

## 2024-11-02 ENCOUNTER — Other Ambulatory Visit: Payer: Self-pay | Admitting: Adult Health

## 2024-11-02 MED ORDER — METRONIDAZOLE 500 MG PO TABS: 500.0000 mg | ORAL_TABLET | Freq: Two times a day (BID) | ORAL | 0 refills | Status: DC

## 2024-11-02 NOTE — Progress Notes (Signed)
 Refilled flagyl

## 2024-11-25 ENCOUNTER — Other Ambulatory Visit (HOSPITAL_COMMUNITY)
Admission: RE | Admit: 2024-11-25 | Discharge: 2024-11-25 | Disposition: A | Discharge status: Home / Self Care | Source: Ambulatory Visit | Attending: Obstetrics & Gynecology | Admitting: Obstetrics & Gynecology

## 2024-11-25 ENCOUNTER — Ambulatory Visit (INDEPENDENT_AMBULATORY_CARE_PROVIDER_SITE_OTHER): Admitting: *Deleted

## 2024-11-25 DIAGNOSIS — N898 Other specified noninflammatory disorders of vagina: Secondary | ICD-10-CM | POA: Diagnosis present

## 2024-11-25 DIAGNOSIS — Z113 Encounter for screening for infections with a predominantly sexual mode of transmission: Secondary | ICD-10-CM

## 2024-11-25 NOTE — Progress Notes (Signed)
" ° °  NURSE VISIT- VAGINITIS/STD/POC  SUBJECTIVE:  Tiffany Goodwin is a 34 y.o. [id number] GYN patientfemale here for a vaginal swab for vaginitis screening, STD screen.  She reports the following symptoms: discharge described as white for several  days. Denies abnormal vaginal bleeding, significant pelvic pain, fever, or UTI symptoms.  OBJECTIVE:  There were no vitals taken for this visit.  Appears well, in no apparent distress  ASSESSMENT: Vaginal swab for vaginitis screening  PLAN: Self-collected vaginal probe for Gonorrhea, Chlamydia, Trichomonas, Bacterial Vaginosis, Yeast sent to lab Treatment: to be determined once results are received Follow-up as needed if symptoms persist/worsen, or new symptoms develop  Alan LITTIE Fischer  11/25/2024 11:35 AM  "

## 2024-11-26 ENCOUNTER — Ambulatory Visit: Payer: Self-pay | Admitting: Adult Health

## 2024-11-26 LAB — CERVICOVAGINAL ANCILLARY ONLY
Bacterial Vaginitis (gardnerella): NEGATIVE
Candida Glabrata: NEGATIVE
Candida Vaginitis: NEGATIVE
Chlamydia: NEGATIVE
Comment: NEGATIVE
Comment: NEGATIVE
Comment: NEGATIVE
Comment: NEGATIVE
Comment: NEGATIVE
Comment: NORMAL
Neisseria Gonorrhea: NEGATIVE
Trichomonas: NEGATIVE

## 2024-12-10 ENCOUNTER — Encounter: Payer: Self-pay | Admitting: Emergency Medicine

## 2024-12-10 ENCOUNTER — Ambulatory Visit
Admission: EM | Admit: 2024-12-10 | Discharge: 2024-12-10 | Disposition: A | Discharge status: Home / Self Care | Attending: Nurse Practitioner | Admitting: Nurse Practitioner

## 2024-12-10 DIAGNOSIS — B349 Viral infection, unspecified: Secondary | ICD-10-CM

## 2024-12-10 LAB — POC COVID19/FLU A&B COMBO
Covid Antigen, POC: NEGATIVE
Influenza A Antigen, POC: NEGATIVE
Influenza B Antigen, POC: NEGATIVE

## 2024-12-10 NOTE — Discharge Instructions (Signed)
 The COVID/flu test was negative. Recommend over-the-counter Tylenol  or ibuprofen  as needed for pain, fever, or general discomfort. Increase your fluid intake.  Try to drink at least 8-10 8 ounce glasses of water daily while symptoms persist. For your abdominal pain, recommend a bland diet to include soup, broth, yogurt, pudding, or Jell-O. Symptoms should begin to improve over the next 5 to 7 days.  If symptoms fail to improve, or begin to worsen, you may follow-up in this clinic or with your primary care physician for further evaluation. Follow-up as needed.

## 2024-12-10 NOTE — ED Provider Notes (Signed)
 " [facility]    CSN: [id number] Arrival date & time: 12/10/24  1518      History   Chief Complaint No chief complaint on file.   HPI Tiffany Goodwin is a 34 y.o. female.   The history is provided by the patient.   Patient presents for complaints of bodyaches, chills, headache, and abdominal pain that started 1 day ago.  She denies fever, chills, ear pain, ear drainage, nasal congestion, sore throat, runny nose, cough, vomiting, diarrhea, or rash.  Patient denies any obvious close sick contacts.  States she has been taking over-the-counter medications for her symptoms.  Past Medical History:  Diagnosis Date   Abnormal Pap smear of cervix 04/03/2018   03/31/18: ASCUS/HPV   Colpo___   Anxiety    past   BV (bacterial vaginosis) 10/21/2014   High cholesterol    Itching in the vaginal area 12/07/2015   LLQ pain 04/19/2015   Missed period 04/19/2015   Patient desires pregnancy 12/07/2015   Pelvic pain in female 11/18/2014   Pneumonia 2016, january; 12/2018   left side   Screening for STD (sexually transmitted disease) 01/20/2015   Spider vein of right lower extremity 09/04/2017   Vaginal discharge 10/21/2014    Patient Active Problem List   Diagnosis Date Noted   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   Prediabetes 01/30/2024   Herpes 12/18/2023   Vaginal odor 10/09/2023   Vaginal discharge 10/09/2023   Encounter for routine adult physical exam with abnormal findings 07/18/2023   Allergic reaction 05/15/2023   Shoulder pain, bilateral 01/28/2023   Viral upper respiratory illness 01/28/2023   Constipation 02/28/2022   Hemorrhoids 02/28/2022   Anxiety 02/28/2022   Encounter for well woman exam with routine gynecological exam 02/28/2022   Screening examination for STD (sexually transmitted disease) 02/28/2022   Rectal bleeding 02/28/2022   Encounter for gynecological examination with Papanicolaou smear of cervix 02/28/2022   Encounter for screening fecal  occult blood testing 02/28/2022   Enlarged thyroid 07/05/2021   HLD (hyperlipidemia) 07/05/2021   COVID-19 06/06/2021   Encounter for surveillance of contraceptive pills 02/22/2021   History of abnormal cervical Pap smear 02/22/2021   Encounter for general adult medical examination with abnormal findings 02/22/2021   Depression, major, single episode, mild 05/11/2020   Panic disorder 03/16/2020    Past Surgical History:  Procedure Laterality Date   TONSILLECTOMY     WISDOM TOOTH EXTRACTION     WISDOM TOOTH EXTRACTION      OB History     Gravida  2   Para  2   Term  2   Preterm  0   AB  0   Living  2      SAB  0   IAB  0   Ectopic  0   Multiple  0   Live Births  2            Home Medications    Prior to Admission medications  Medication Sig Start Date End Date Taking? Authorizing Provider  hydrOXYzine  (ATARAX ) 10 MG tablet Take 1 tablet (10 mg total) by mouth 3 (three) times daily as needed for itching. 01/30/24   Del Wilhelmena Lloyd Sola, FNP  Norethindrone Acetate-Ethinyl Estrad-FE (LOESTRIN 24 FE) 1-20 MG-MCG(24) tablet Take 1 tablet by mouth daily. 04/08/24   Signa Delon LABOR, NP  valACYclovir  (VALTREX ) 1000 MG tablet Take 1 tablet (1,000 mg total) by mouth 2 (two) times daily. 12/18/23   Signa,  Delon LABOR, NP    Family History Family History  Problem Relation Age of Onset   Other Paternal Grandfather        brain aneursym   Heart attack Maternal Grandmother    Stroke Maternal Grandmother    Dementia Maternal Grandmother    Cancer Maternal Grandfather        bone,chest,liver,brain,pancreatic,throat   Cancer Mother        lung   Anesthesia problems Neg Hx    Hypotension Neg Hx    Malignant hyperthermia Neg Hx    Pseudochol deficiency Neg Hx     Social History Social History[1]   Allergies   Patient has no known allergies.   Review of Systems Review of Systems Per HPI  Physical Exam Triage Vital Signs ED Triage Vitals   Encounter Vitals Group     BP 12/10/24 1606 112/72     Girls Systolic BP Percentile --      Girls Diastolic BP Percentile --      Boys Systolic BP Percentile --      Boys Diastolic BP Percentile --      Pulse Rate 12/10/24 1606 85     Resp 12/10/24 1606 16     Temp 12/10/24 1606 (!) 97.5 F (36.4 C)     Temp Source 12/10/24 1606 Oral     SpO2 12/10/24 1606 97 %     Weight --      Height --      Head Circumference --      Peak Flow --      Pain Score 12/10/24 1607 7     Pain Loc --      Pain Education --      Exclude from Growth Chart --    No data found.  Updated Vital Signs BP 112/72 (BP Location: Right Arm)   Pulse 85   Temp (!) 97.5 F (36.4 C) (Oral)   Resp 16   LMP 12/06/2024 (Exact Date)   SpO2 97%   Visual Acuity Right Eye Distance:   Left Eye Distance:   Bilateral Distance:    Right Eye Near:   Left Eye Near:    Bilateral Near:     Physical Exam Vitals and nursing note reviewed.  Constitutional:      General: She is not in acute distress.    Appearance: Normal appearance.  HENT:     Head: Normocephalic.     Right Ear: Tympanic membrane, ear canal and external ear normal.     Left Ear: Tympanic membrane, ear canal and external ear normal.     Nose: Nose normal.     Mouth/Throat:     Lips: Pink.     Mouth: Mucous membranes are moist.     Pharynx: Oropharynx is clear. Uvula midline.  Eyes:     Extraocular Movements: Extraocular movements intact.     Conjunctiva/sclera: Conjunctivae normal.     Pupils: Pupils are equal, round, and reactive to light.  Cardiovascular:     Rate and Rhythm: Normal rate and regular rhythm.     Pulses: Normal pulses.     Heart sounds: Normal heart sounds.  Pulmonary:     Effort: Pulmonary effort is normal. No respiratory distress.     Breath sounds: Normal breath sounds. No stridor. No wheezing, rhonchi or rales.  Abdominal:     General: Bowel sounds are normal.     Palpations: Abdomen is soft.     Tenderness:  There is no abdominal  tenderness.  Musculoskeletal:     Cervical back: Normal range of motion.  Skin:    General: Skin is warm and dry.  Neurological:     General: No focal deficit present.     Mental Status: She is alert and oriented to person, place, and time.  Psychiatric:        Mood and Affect: Mood normal.        Behavior: Behavior normal.      UC Treatments / Results  Labs (all labs ordered are listed, but only abnormal results are displayed) Labs Reviewed  POC COVID19/FLU A&B COMBO - Normal    EKG   Radiology No results found.  Procedures Procedures (including critical care time)  Medications Ordered in UC Medications - No data to display  Initial Impression / Assessment and Plan / UC Course  I have reviewed the triage vital signs and the nursing notes.  Pertinent labs & imaging results that were available during my care of the patient were reviewed by me and considered in my medical decision making (see chart for details).  COVID/flu test was negative.  On exam, the patient's lung sounds are clear throughout, room air sats are at 97%.  Symptoms consistent with viral etiology.  Given her current symptoms, recommend over-the-counter analgesics such as Tylenol  or ibuprofen , increasing fluids and allowing for plenty of rest.  Discussed indications with the patient regarding follow-up.  Patient was in agreement with this plan of care and verbalizes understanding.  All questions were answered.  Patient stable for discharge.  Final Clinical Impressions(s) / UC Diagnoses   Final diagnoses:  Viral illness     Discharge Instructions      The COVID/flu test was negative. Recommend over-the-counter Tylenol  or ibuprofen  as needed for pain, fever, or general discomfort. Increase your fluid intake.  Try to drink at least 8-10 8 ounce glasses of water daily while symptoms persist. For your abdominal pain, recommend a bland diet to include soup, broth, yogurt, pudding,  or Jell-O. Symptoms should begin to improve over the next 5 to 7 days.  If symptoms fail to improve, or begin to worsen, you may follow-up in this clinic or with your primary care physician for further evaluation. Follow-up as needed.     ED Prescriptions   None    PDMP not reviewed this encounter.    [1]  Social History Tobacco Use   Smoking status: Never   Smokeless tobacco: Never  Vaping Use   Vaping status: Never Used  Substance Use Topics   Alcohol use: Yes    Comment: occ   Drug use: No     Gilmer Etta PARAS, NP 12/10/24 1644  "

## 2024-12-10 NOTE — ED Triage Notes (Signed)
 Body aches, chills, headache, stomach pain since yesterday.
